# Patient Record
Sex: Male | Born: 1935 | Race: White | Hispanic: No | Marital: Married | Smoking: Never smoker
Health system: Southern US, Community
[De-identification: ages and names within clinical notes are randomized; demographics above are authoritative.]

## PROBLEM LIST (undated history)

## (undated) DIAGNOSIS — D649 Anemia, unspecified: Secondary | ICD-10-CM

## (undated) DIAGNOSIS — Z8719 Personal history of other diseases of the digestive system: Secondary | ICD-10-CM

## (undated) DIAGNOSIS — H409 Unspecified glaucoma: Secondary | ICD-10-CM

## (undated) DIAGNOSIS — I10 Essential (primary) hypertension: Secondary | ICD-10-CM

## (undated) DIAGNOSIS — N189 Chronic kidney disease, unspecified: Secondary | ICD-10-CM

## (undated) DIAGNOSIS — K5792 Diverticulitis of intestine, part unspecified, without perforation or abscess without bleeding: Secondary | ICD-10-CM

## (undated) DIAGNOSIS — G709 Myoneural disorder, unspecified: Secondary | ICD-10-CM

## (undated) DIAGNOSIS — E119 Type 2 diabetes mellitus without complications: Secondary | ICD-10-CM

## (undated) DIAGNOSIS — E785 Hyperlipidemia, unspecified: Secondary | ICD-10-CM

## (undated) HISTORY — DX: Hyperlipidemia, unspecified: E78.5

## (undated) HISTORY — PX: BACK SURGERY: SHX140

## (undated) HISTORY — DX: Chronic kidney disease, unspecified: N18.9

## (undated) HISTORY — DX: Unspecified glaucoma: H40.9

## (undated) HISTORY — PX: HERNIA REPAIR: SHX51

## (undated) HISTORY — DX: Diverticulitis of intestine, part unspecified, without perforation or abscess without bleeding: K57.92

## (undated) HISTORY — DX: Anemia, unspecified: D64.9

## (undated) HISTORY — DX: Myoneural disorder, unspecified: G70.9

---

## 2004-02-08 ENCOUNTER — Other Ambulatory Visit: Payer: Self-pay

## 2004-10-04 ENCOUNTER — Ambulatory Visit: Payer: Self-pay | Admitting: Surgery

## 2004-10-11 ENCOUNTER — Ambulatory Visit: Payer: Self-pay | Admitting: Surgery

## 2007-03-17 ENCOUNTER — Ambulatory Visit: Payer: Self-pay | Admitting: Gastroenterology

## 2008-09-27 ENCOUNTER — Ambulatory Visit: Payer: Self-pay | Admitting: Internal Medicine

## 2008-09-28 ENCOUNTER — Ambulatory Visit: Payer: Self-pay | Admitting: Internal Medicine

## 2012-06-24 ENCOUNTER — Ambulatory Visit: Payer: Self-pay | Admitting: Ophthalmology

## 2012-06-24 DIAGNOSIS — I10 Essential (primary) hypertension: Secondary | ICD-10-CM

## 2012-07-05 ENCOUNTER — Ambulatory Visit: Payer: Self-pay | Admitting: Ophthalmology

## 2013-07-18 ENCOUNTER — Ambulatory Visit: Payer: Self-pay | Admitting: Gastroenterology

## 2013-07-20 LAB — PATHOLOGY REPORT

## 2014-05-19 DIAGNOSIS — I951 Orthostatic hypotension: Secondary | ICD-10-CM | POA: Insufficient documentation

## 2014-05-19 DIAGNOSIS — E785 Hyperlipidemia, unspecified: Secondary | ICD-10-CM | POA: Insufficient documentation

## 2014-05-19 DIAGNOSIS — G629 Polyneuropathy, unspecified: Secondary | ICD-10-CM | POA: Insufficient documentation

## 2014-05-19 DIAGNOSIS — I10 Essential (primary) hypertension: Secondary | ICD-10-CM | POA: Insufficient documentation

## 2014-12-06 DIAGNOSIS — I482 Chronic atrial fibrillation, unspecified: Secondary | ICD-10-CM | POA: Insufficient documentation

## 2015-01-30 NOTE — Op Note (Signed)
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  PROCEDURE:  Phacoemulsification with posterior chamber intraocular lens placement which required pupil stretching with the Graether pupil expansion device.   LENS IMPLANT:  ZCB00 24.0-diopter.   ULTRASOUND TIME:  23% of 2 minute and 4 seconds for CDE of 29.3.  SURGEON:  Mali Bryston Colocho, MD  ANESTHESIA:  Retrobulbar block of Xylocaine and Bupivacaine and Vitrase.   COMPLICATIONS:  None.  DESCRIPTION OF PROCEDURE:  The patient was identified in the holding room, transported to the operating room, and placed in the supine position.  The right eye was identified as the operative eye and a retrobulbar block was administered under intravenous sedation.  It was then prepped and draped in the usual sterile ophthalmic fashion.  The eye was inspected and the pupil was noted to be 4 mm and opened to 8 mm with maximal pharmacologic dilation.  A 1 mm side-port incision was made at the 12 o'clock position.  The anterior chamber was filled with Viscoat.  A 2.4 mm near-clear corneal incision was made at the 9 o'clock  position.  A Graether pupil expander was then placed through the main incision and into the anterior chamber of the eye.  The edge of the iris was secured on the lip of the pupil expander and it was released, thereby expanding the pupil to approximately 7 millimeters for completion of the cataract surgery.  A cystotome and capsulorrhexis forceps were used to make a curvilinear capsulorrhexis.   Balanced salt solution was used to hydrodissect and hydrodelineate the lens nucleus.  Phacoemulsification was used in stop and chop fashion to remove the lens, nucleus and epinucleus.  The remaining cortex was aspirated using the irrigation aspiration  handpiece.  Additional Provisc was placed into the eye to distend the capsular bag for lens placement.  ZCB00 24.0-diopter lens was then injected into the capsular bag.  The remaining viscoelastic was aspirated from the capsular bag and the anterior chamber. The pupil expanding ring was removed using a Lester hook.  The anterior chamber was filled with balanced salt solution to inflate to a physiologic pressure.    The wounds were hydrated with balanced salt solution.  The eye was noted to have a physiologic pressure without wound leak.  Topical Vigamox drops, Omnipred drops and Maxitrol ointment were applied to the eye.  The eye was shielded.   The patient was taken to the recovery room in stable condition. ____________________________ Wyonia Hough, MD crb:slb D: 07/05/2012 11:10:21 ET T: 07/05/2012 11:55:37 ET JOB#: [id number]  cc: Wyonia Hough, MD, <Dictator> Leandrew Koyanagi MD ELECTRONICALLY SIGNED 07/05/2012 13:21

## 2015-06-02 ENCOUNTER — Encounter: Payer: Self-pay | Admitting: Surgery

## 2015-06-02 ENCOUNTER — Inpatient Hospital Stay
Admission: EM | Admit: 2015-06-02 | Discharge: 2015-06-27 | DRG: 329 | Disposition: A | Discharge status: Skilled Nursing Facility | Payer: Medicare Other | Attending: Surgery | Admitting: Surgery

## 2015-06-02 ENCOUNTER — Emergency Department: Payer: Medicare Other

## 2015-06-02 DIAGNOSIS — N1330 Unspecified hydronephrosis: Secondary | ICD-10-CM | POA: Diagnosis not present

## 2015-06-02 DIAGNOSIS — N179 Acute kidney failure, unspecified: Secondary | ICD-10-CM

## 2015-06-02 DIAGNOSIS — R338 Other retention of urine: Secondary | ICD-10-CM | POA: Diagnosis not present

## 2015-06-02 DIAGNOSIS — J90 Pleural effusion, not elsewhere classified: Secondary | ICD-10-CM | POA: Diagnosis not present

## 2015-06-02 DIAGNOSIS — K5732 Diverticulitis of large intestine without perforation or abscess without bleeding: Secondary | ICD-10-CM

## 2015-06-02 DIAGNOSIS — E871 Hypo-osmolality and hyponatremia: Secondary | ICD-10-CM | POA: Diagnosis not present

## 2015-06-02 DIAGNOSIS — R0989 Other specified symptoms and signs involving the circulatory and respiratory systems: Secondary | ICD-10-CM | POA: Diagnosis not present

## 2015-06-02 DIAGNOSIS — D631 Anemia in chronic kidney disease: Secondary | ICD-10-CM | POA: Diagnosis present

## 2015-06-02 DIAGNOSIS — J449 Chronic obstructive pulmonary disease, unspecified: Secondary | ICD-10-CM | POA: Diagnosis present

## 2015-06-02 DIAGNOSIS — I129 Hypertensive chronic kidney disease with stage 1 through stage 4 chronic kidney disease, or unspecified chronic kidney disease: Secondary | ICD-10-CM | POA: Diagnosis present

## 2015-06-02 DIAGNOSIS — I482 Chronic atrial fibrillation: Secondary | ICD-10-CM | POA: Diagnosis present

## 2015-06-02 DIAGNOSIS — E785 Hyperlipidemia, unspecified: Secondary | ICD-10-CM | POA: Diagnosis present

## 2015-06-02 DIAGNOSIS — Z8249 Family history of ischemic heart disease and other diseases of the circulatory system: Secondary | ICD-10-CM | POA: Diagnosis not present

## 2015-06-02 DIAGNOSIS — E876 Hypokalemia: Secondary | ICD-10-CM | POA: Diagnosis not present

## 2015-06-02 DIAGNOSIS — G629 Polyneuropathy, unspecified: Secondary | ICD-10-CM | POA: Diagnosis present

## 2015-06-02 DIAGNOSIS — N138 Other obstructive and reflux uropathy: Secondary | ICD-10-CM | POA: Diagnosis not present

## 2015-06-02 DIAGNOSIS — N401 Enlarged prostate with lower urinary tract symptoms: Secondary | ICD-10-CM | POA: Diagnosis not present

## 2015-06-02 DIAGNOSIS — N183 Chronic kidney disease, stage 3 (moderate): Secondary | ICD-10-CM | POA: Diagnosis present

## 2015-06-02 DIAGNOSIS — K567 Ileus, unspecified: Secondary | ICD-10-CM | POA: Diagnosis not present

## 2015-06-02 DIAGNOSIS — J9811 Atelectasis: Secondary | ICD-10-CM | POA: Diagnosis not present

## 2015-06-02 DIAGNOSIS — Z79899 Other long term (current) drug therapy: Secondary | ICD-10-CM

## 2015-06-02 DIAGNOSIS — D72829 Elevated white blood cell count, unspecified: Secondary | ICD-10-CM

## 2015-06-02 DIAGNOSIS — M545 Low back pain: Secondary | ICD-10-CM | POA: Diagnosis not present

## 2015-06-02 DIAGNOSIS — H9190 Unspecified hearing loss, unspecified ear: Secondary | ICD-10-CM | POA: Diagnosis present

## 2015-06-02 DIAGNOSIS — K219 Gastro-esophageal reflux disease without esophagitis: Secondary | ICD-10-CM | POA: Diagnosis present

## 2015-06-02 DIAGNOSIS — H409 Unspecified glaucoma: Secondary | ICD-10-CM | POA: Diagnosis present

## 2015-06-02 DIAGNOSIS — J9600 Acute respiratory failure, unspecified whether with hypoxia or hypercapnia: Secondary | ICD-10-CM | POA: Diagnosis not present

## 2015-06-02 DIAGNOSIS — E8770 Fluid overload, unspecified: Secondary | ICD-10-CM | POA: Diagnosis not present

## 2015-06-02 DIAGNOSIS — K913 Postprocedural intestinal obstruction: Secondary | ICD-10-CM | POA: Diagnosis not present

## 2015-06-02 DIAGNOSIS — K5792 Diverticulitis of intestine, part unspecified, without perforation or abscess without bleeding: Secondary | ICD-10-CM | POA: Diagnosis not present

## 2015-06-02 DIAGNOSIS — Z7901 Long term (current) use of anticoagulants: Secondary | ICD-10-CM | POA: Diagnosis not present

## 2015-06-02 DIAGNOSIS — Z4659 Encounter for fitting and adjustment of other gastrointestinal appliance and device: Secondary | ICD-10-CM

## 2015-06-02 DIAGNOSIS — R0602 Shortness of breath: Secondary | ICD-10-CM

## 2015-06-02 DIAGNOSIS — K5700 Diverticulitis of small intestine with perforation and abscess without bleeding: Secondary | ICD-10-CM | POA: Diagnosis not present

## 2015-06-02 DIAGNOSIS — E1122 Type 2 diabetes mellitus with diabetic chronic kidney disease: Secondary | ICD-10-CM | POA: Diagnosis present

## 2015-06-02 DIAGNOSIS — K5720 Diverticulitis of large intestine with perforation and abscess without bleeding: Principal | ICD-10-CM

## 2015-06-02 DIAGNOSIS — Z452 Encounter for adjustment and management of vascular access device: Secondary | ICD-10-CM

## 2015-06-02 DIAGNOSIS — IMO0001 Reserved for inherently not codable concepts without codable children: Secondary | ICD-10-CM

## 2015-06-02 DIAGNOSIS — K5730 Diverticulosis of large intestine without perforation or abscess without bleeding: Secondary | ICD-10-CM | POA: Diagnosis present

## 2015-06-02 HISTORY — DX: Essential (primary) hypertension: I10

## 2015-06-02 HISTORY — DX: Personal history of other diseases of the digestive system: Z87.19

## 2015-06-02 HISTORY — DX: Type 2 diabetes mellitus without complications: E11.9

## 2015-06-02 LAB — URINALYSIS COMPLETE WITH MICROSCOPIC (ARMC ONLY)
Bacteria, UA: NONE SEEN
Bilirubin Urine: NEGATIVE
Glucose, UA: NEGATIVE mg/dL
KETONES UR: NEGATIVE mg/dL
Leukocytes, UA: NEGATIVE
Nitrite: NEGATIVE
PROTEIN: NEGATIVE mg/dL
SQUAMOUS EPITHELIAL / LPF: NONE SEEN
Specific Gravity, Urine: 1.015 (ref 1.005–1.030)
pH: 6 (ref 5.0–8.0)

## 2015-06-02 LAB — CBC WITH DIFFERENTIAL/PLATELET
BASOS ABS: 0 10*3/uL (ref 0–0.1)
Basophils Relative: 0 %
Eosinophils Absolute: 0.9 10*3/uL — ABNORMAL HIGH (ref 0–0.7)
Eosinophils Relative: 6 %
HEMATOCRIT: 45.4 % (ref 40.0–52.0)
Hemoglobin: 15.3 g/dL (ref 13.0–18.0)
LYMPHS PCT: 9 %
Lymphs Abs: 1.3 10*3/uL (ref 1.0–3.6)
MCH: 31.6 pg (ref 26.0–34.0)
MCHC: 33.8 g/dL (ref 32.0–36.0)
MCV: 93.6 fL (ref 80.0–100.0)
Monocytes Absolute: 0.5 10*3/uL (ref 0.2–1.0)
Monocytes Relative: 4 %
NEUTROS ABS: 11.9 10*3/uL — AB (ref 1.4–6.5)
NEUTROS PCT: 81 %
Platelets: 260 10*3/uL (ref 150–440)
RBC: 4.85 MIL/uL (ref 4.40–5.90)
RDW: 14.7 % — ABNORMAL HIGH (ref 11.5–14.5)
WBC: 14.6 10*3/uL — AB (ref 3.8–10.6)

## 2015-06-02 LAB — COMPREHENSIVE METABOLIC PANEL
ALBUMIN: 4.6 g/dL (ref 3.5–5.0)
ALT: 14 U/L — AB (ref 17–63)
AST: 20 U/L (ref 15–41)
Alkaline Phosphatase: 61 U/L (ref 38–126)
Anion gap: 12 (ref 5–15)
BILIRUBIN TOTAL: 1.2 mg/dL (ref 0.3–1.2)
BUN: 31 mg/dL — AB (ref 6–20)
CHLORIDE: 98 mmol/L — AB (ref 101–111)
CO2: 27 mmol/L (ref 22–32)
CREATININE: 2.18 mg/dL — AB (ref 0.61–1.24)
Calcium: 10.1 mg/dL (ref 8.9–10.3)
GFR calc Af Amer: 29 mL/min — ABNORMAL LOW (ref >60)
GFR, EST NON AFRICAN AMERICAN: 25 mL/min — AB (ref >60)
GLUCOSE: 127 mg/dL — AB (ref 65–99)
Potassium: 3.3 mmol/L — ABNORMAL LOW (ref 3.5–5.1)
Sodium: 137 mmol/L (ref 135–145)
TOTAL PROTEIN: 7.4 g/dL (ref 6.5–8.1)

## 2015-06-02 LAB — LIPASE, BLOOD: LIPASE: 27 U/L (ref 22–51)

## 2015-06-02 MED ORDER — IOHEXOL 240 MG/ML SOLN
25.0000 mL | Freq: Once | INTRAMUSCULAR | Status: AC | PRN
  Administered 2015-06-02: 25 mL via ORAL

## 2015-06-02 MED ORDER — PIPERACILLIN-TAZOBACTAM 3.375 G IVPB
3.3750 g | Freq: Three times a day (TID) | INTRAVENOUS | Status: DC
  Administered 2015-06-03 – 2015-06-18 (×44): 3.375 g via INTRAVENOUS
  Filled 2015-06-02 (×49): qty 50

## 2015-06-02 MED ORDER — TIMOLOL MALEATE 0.25 % OP SOLN
2.0000 [drp] | Freq: Every day | OPHTHALMIC | Status: DC
  Administered 2015-06-03: 2 [drp] via OPHTHALMIC
  Filled 2015-06-02: qty 5

## 2015-06-02 MED ORDER — DORZOLAMIDE HCL 2 % OP SOLN
2.0000 [drp] | Freq: Every day | OPHTHALMIC | Status: DC
  Administered 2015-06-03: 2 [drp] via OPHTHALMIC
  Filled 2015-06-02: qty 10

## 2015-06-02 MED ORDER — LATANOPROST 0.005 % OP SOLN
2.0000 [drp] | Freq: Every day | OPHTHALMIC | Status: DC
  Filled 2015-06-02: qty 2.5

## 2015-06-02 MED ORDER — PANTOPRAZOLE SODIUM 40 MG IV SOLR
40.0000 mg | Freq: Every day | INTRAVENOUS | Status: DC
  Administered 2015-06-03 – 2015-06-07 (×6): 40 mg via INTRAVENOUS
  Filled 2015-06-02 (×6): qty 40

## 2015-06-02 MED ORDER — METOPROLOL SUCCINATE ER 100 MG PO TB24
200.0000 mg | ORAL_TABLET | Freq: Every day | ORAL | Status: DC
  Administered 2015-06-03 – 2015-06-05 (×3): 200 mg via ORAL
  Filled 2015-06-02 (×3): qty 2

## 2015-06-02 MED ORDER — ONDANSETRON HCL 4 MG/2ML IJ SOLN
4.0000 mg | Freq: Once | INTRAMUSCULAR | Status: AC
  Administered 2015-06-02: 4 mg via INTRAVENOUS

## 2015-06-02 MED ORDER — NALOXONE HCL 0.4 MG/ML IJ SOLN
0.4000 mg | INTRAMUSCULAR | Status: DC | PRN
  Filled 2015-06-02: qty 1

## 2015-06-02 MED ORDER — DIPHENHYDRAMINE HCL 12.5 MG/5ML PO ELIX
12.5000 mg | ORAL_SOLUTION | Freq: Four times a day (QID) | ORAL | Status: DC | PRN
  Filled 2015-06-02: qty 5

## 2015-06-02 MED ORDER — ONDANSETRON HCL 4 MG/2ML IJ SOLN: 4.0000 mg | Freq: Four times a day (QID) | INTRAMUSCULAR | Status: DC | PRN

## 2015-06-02 MED ORDER — MORPHINE SULFATE (PF) 4 MG/ML IV SOLN
4.0000 mg | Freq: Once | INTRAVENOUS | Status: AC
  Administered 2015-06-02: 4 mg via INTRAVENOUS
  Filled 2015-06-02: qty 1

## 2015-06-02 MED ORDER — ONDANSETRON HCL 4 MG/2ML IJ SOLN
4.0000 mg | Freq: Once | INTRAMUSCULAR | Status: AC
  Administered 2015-06-02: 4 mg via INTRAVENOUS
  Filled 2015-06-02: qty 2

## 2015-06-02 MED ORDER — TRAMADOL HCL 50 MG PO TABS
ORAL_TABLET | ORAL | Status: AC
  Filled 2015-06-02: qty 1

## 2015-06-02 MED ORDER — ACETAMINOPHEN 650 MG RE SUPP: 650.0000 mg | Freq: Four times a day (QID) | RECTAL | Status: DC | PRN

## 2015-06-02 MED ORDER — HYDROMORPHONE 0.3 MG/ML IV SOLN
INTRAVENOUS | Status: DC
  Administered 2015-06-03: 0.3 mg via INTRAVENOUS
  Administered 2015-06-03 (×2): 0.1 mg via INTRAVENOUS
  Administered 2015-06-03: 02:00:00 via INTRAVENOUS
  Administered 2015-06-04: 0.99 mg via INTRAVENOUS
  Administered 2015-06-04: 0 mg via INTRAVENOUS
  Administered 2015-06-04: 0.1 mg via INTRAVENOUS
  Administered 2015-06-04 (×2): 0 mg via INTRAVENOUS
  Administered 2015-06-04: 0.2 mg via INTRAVENOUS
  Administered 2015-06-05: 0.1 mg via INTRAVENOUS
  Administered 2015-06-05: 0.899 mg via INTRAVENOUS
  Administered 2015-06-05: 0.3 mg via INTRAVENOUS
  Administered 2015-06-05: 0.5 mg via INTRAVENOUS
  Administered 2015-06-05: 0.699 mg via INTRAVENOUS
  Administered 2015-06-06: 0.1 mg via INTRAVENOUS
  Administered 2015-06-06: 8.67 mg via INTRAVENOUS
  Administered 2015-06-07: 05:00:00 via INTRAVENOUS
  Administered 2015-06-07: 0.699 mg via INTRAVENOUS
  Administered 2015-06-07: 1.33 mg via INTRAVENOUS
  Administered 2015-06-07: 0.299 mg via INTRAVENOUS
  Administered 2015-06-07: 0.3 mg via INTRAVENOUS
  Administered 2015-06-08: 0 mg via INTRAVENOUS
  Administered 2015-06-08: 0.2 mg via INTRAVENOUS
  Administered 2015-06-08: 0 mg via INTRAVENOUS
  Administered 2015-06-08: 0.3 mg via INTRAVENOUS
  Administered 2015-06-08: 0.4999 mg via INTRAVENOUS
  Administered 2015-06-09: 0.1 mg via INTRAVENOUS
  Administered 2015-06-09: 4 mg via INTRAVENOUS
  Administered 2015-06-09: 0.1 mg via INTRAVENOUS
  Administered 2015-06-09: 14:00:00 via INTRAVENOUS
  Administered 2015-06-09: 0.5 mg via INTRAVENOUS
  Administered 2015-06-10: 0.4 mg via INTRAVENOUS
  Administered 2015-06-10: 0.2 mg via INTRAVENOUS
  Administered 2015-06-10: 0.999 mg via INTRAVENOUS
  Administered 2015-06-10: 1.49 mg via INTRAVENOUS
  Administered 2015-06-10: 0.39 mg via INTRAVENOUS
  Administered 2015-06-11: 19:00:00 via INTRAVENOUS
  Administered 2015-06-11: 0.2 mg via INTRAVENOUS
  Administered 2015-06-11: 1.09 mg via INTRAVENOUS
  Filled 2015-06-02 (×5): qty 25

## 2015-06-02 MED ORDER — ONDANSETRON 4 MG PO TBDP
4.0000 mg | ORAL_TABLET | Freq: Four times a day (QID) | ORAL | Status: DC | PRN
  Filled 2015-06-02: qty 1

## 2015-06-02 MED ORDER — HEPARIN SODIUM (PORCINE) 5000 UNIT/ML IJ SOLN
5000.0000 [IU] | Freq: Three times a day (TID) | INTRAMUSCULAR | Status: DC
  Administered 2015-06-03 – 2015-06-07 (×14): 5000 [IU] via SUBCUTANEOUS
  Filled 2015-06-02 (×14): qty 1

## 2015-06-02 MED ORDER — BRIMONIDINE TARTRATE 0.2 % OP SOLN
2.0000 [drp] | Freq: Every day | OPHTHALMIC | Status: DC
  Administered 2015-06-03: 2 [drp] via OPHTHALMIC
  Filled 2015-06-02: qty 5

## 2015-06-02 MED ORDER — ONDANSETRON HCL 4 MG/2ML IJ SOLN
INTRAMUSCULAR | Status: AC
  Administered 2015-06-02: 4 mg via INTRAVENOUS
  Filled 2015-06-02: qty 2

## 2015-06-02 MED ORDER — PIPERACILLIN-TAZOBACTAM 3.375 G IVPB 30 MIN
3.3750 g | Freq: Once | INTRAVENOUS | Status: AC
Start: 2015-06-02 — End: 2015-06-03
  Administered 2015-06-03: 3.375 g via INTRAVENOUS

## 2015-06-02 MED ORDER — ACETAMINOPHEN 325 MG PO TABS: 650.0000 mg | ORAL_TABLET | Freq: Four times a day (QID) | ORAL | Status: DC | PRN

## 2015-06-02 MED ORDER — SODIUM CHLORIDE 0.9 % IV SOLN
INTRAVENOUS | Status: DC
  Administered 2015-06-03 – 2015-06-04 (×5): via INTRAVENOUS

## 2015-06-02 MED ORDER — ONDANSETRON HCL 4 MG/2ML IJ SOLN
4.0000 mg | Freq: Four times a day (QID) | INTRAMUSCULAR | Status: DC | PRN
  Administered 2015-06-04 – 2015-06-24 (×9): 4 mg via INTRAVENOUS
  Filled 2015-06-02 (×9): qty 2

## 2015-06-02 MED ORDER — SODIUM CHLORIDE 0.9 % IJ SOLN: 9.0000 mL | INTRAMUSCULAR | Status: DC | PRN

## 2015-06-02 MED ORDER — DIPHENHYDRAMINE HCL 50 MG/ML IJ SOLN: 12.5000 mg | Freq: Four times a day (QID) | INTRAMUSCULAR | Status: DC | PRN

## 2015-06-02 NOTE — ED Notes (Signed)
Pt reports pain in his lower abdomen since this afternoon. States that the pain is across lower abdomen, but is more severe on left side. Pt states that this is the worst pain he has ever had. Pt alert & oriented with warm, dry skin. Some distress noted.

## 2015-06-02 NOTE — ED Provider Notes (Signed)
[facility] Emergency Department Provider Note  ____________________________________________  Time seen: On arrival  I have reviewed the triage vital signs and the nursing notes.   HISTORY  Chief Complaint Abdominal Pain    HPI Jesse Macias is a 81 y.o. male who presents with severe left-sided cramping abdominal pain. He reports is been worsening over the last 2 days. He does report a history of diverticulitis but this is much worse. He denies fevers chills. He has had some nausea. He reports the pain is more severe in the area of previous hernia repair in his left groin.     No past medical history on file.  There are no active problems to display for this patient.   No past surgical history on file.  No current outpatient prescriptions on file.  Allergies Review of patient's allergies indicates not on file.  No family history on file.  Social History  patient does not use alcohol or smoke cigarettes  Review of Systems  Constitutional: Negative for fever. Eyes: Negative for visual changes. ENT: Negative for sore throat Cardiovascular: Negative for chest pain. Respiratory: Negative for shortness of breath. Gastrointestinal: Positive for abdominal pain nausea and vomiting Genitourinary: Negative for dysuria. Musculoskeletal: Negative for back pain. Skin: Negative for rash. Neurological: Negative for headaches or focal weakness Psychiatric: No anxiety    ____________________________________________   PHYSICAL EXAM:  VITAL SIGNS: ED Triage Vitals  Enc Vitals Group     BP 06/02/15 2003 188/95 mmHg     Pulse Rate 06/02/15 2003 80     Resp 06/02/15 2003 20     Temp 06/02/15 2003 97.7 F (36.5 C)     Temp Source 06/02/15 2003 Oral     SpO2 06/02/15 2003 96 %     Weight 06/02/15 2003 140 lb (63.504 kg)     Height 06/02/15 2003 5\' 6"  (1.676 m)     Head Cir --      Peak Flow --      Pain Score 06/02/15 2004 10     Pain Loc --       Pain Edu? --      Excl. in [place]? --      Constitutional: Alert and oriented. In obvious pain Eyes: Conjunctivae are normal.  ENT   Head: Normocephalic and atraumatic.   Mouth/Throat: Mucous membranes are moist. Cardiovascular: Normal rate, regular rhythm. Normal and symmetric distal pulses are present in all extremities. No murmurs, rubs, or gallops. Respiratory: Normal respiratory effort without tachypnea nor retractions. Breath sounds are clear and equal bilaterally.  Gastrointestinal: Significant tenderness palpation primarily in the left lower quadrant. No distention. There is no CVA tenderness. Genitourinary: deferred Musculoskeletal: Nontender with normal range of motion in all extremities. No lower extremity tenderness nor edema. Neurologic:  Normal speech and language. No gross focal neurologic deficits are appreciated. Skin:  Skin is warm, dry and intact. No rash noted. Psychiatric: Mood and affect are normal. Patient exhibits appropriate insight and judgment.  ____________________________________________    LABS (pertinent positives/negatives)  Labs Reviewed  CBC WITH DIFFERENTIAL/PLATELET - Abnormal; Notable for the following:    WBC 14.6 (*)    RDW 14.7 (*)    Neutro Abs 11.9 (*)    Eosinophils Absolute 0.9 (*)    All other components within normal limits  COMPREHENSIVE METABOLIC PANEL - Abnormal; Notable for the following:    Potassium 3.3 (*)    Chloride 98 (*)    Glucose, Bld 127 (*)    BUN  31 (*)    Creatinine, Ser 2.18 (*)    ALT 14 (*)    GFR calc non Af Amer 25 (*)    GFR calc Af Amer 29 (*)    All other components within normal limits  URINALYSIS COMPLETEWITH MICROSCOPIC ([facility] ONLY) - Abnormal; Notable for the following:    Color, Urine YELLOW (*)    APPearance CLEAR (*)    Hgb urine dipstick 1+ (*)    All other components within normal limits  LIPASE, BLOOD     ____________________________________________   EKG  None  ____________________________________________    RADIOLOGY I have personally reviewed any xrays that were ordered on this patient: CT abdomen and pelvis is consistent with perforated diverticulitis  ____________________________________________   PROCEDURES  Procedure(s) performed: none  Critical Care performed:yes  CRITICAL CARE Performed by: Lavonia Drafts   Total critical care time: 35 min  Critical care time was exclusive of separately billable procedures and treating other patients.  Critical care was necessary to treat or prevent imminent or life-threatening deterioration.  Critical care was time spent personally by me on the following activities: development of treatment plan with patient and/or surrogate as well as nursing, discussions with consultants, evaluation of patient's response to treatment, examination of patient, obtaining history from patient or surrogate, ordering and performing treatments and interventions, ordering and review of laboratory studies, ordering and review of radiographic studies, pulse oximetry and re-evaluation of patient's condition.   ____________________________________________   INITIAL IMPRESSION / ASSESSMENT AND PLAN / ED COURSE  Pertinent labs & imaging results that were available during my care of the patient were reviewed by me and considered in my medical decision making (see chart for details).  Patient presents with significant abdominal tenderness to palpation. Given location concern for diverticulitis. We will obtain CT of the pelvis. Patient's creatinine is too high for IV contrast. He had significant difficulty with by mouth contrast. Morphine and Zofran given IV 2  CT consistent with perforated diverticulitis. I have consulted general surgery. Dr. Rexene Edison will see the patient in the emergency  department  ____________________________________________   FINAL CLINICAL IMPRESSION(S) / ED DIAGNOSES  Final diagnoses:  Perforated diverticulum of large intestine     Lavonia Drafts, MD 06/02/15 2317

## 2015-06-02 NOTE — Progress Notes (Addendum)
ANTIBIOTIC CONSULT NOTE - INITIAL  Pharmacy Consult for Zosyn Indication: IAI  Not on File  Patient Measurements: Height: 5\' 6"  (167.6 cm) Weight: 140 lb (63.504 kg) IBW/kg (Calculated) : 63.8 Adjusted Body Weight: 63.5 kg  Vital Signs: Temp: 97.7 F (36.5 C) (08/20 2003) Temp Source: Oral (08/20 2003) BP: 188/95 mmHg (08/20 2003) Pulse Rate: 80 (08/20 2003) Intake/Output from previous day:   Intake/Output from this shift:    Labs:  Recent Labs  06/02/15 2016  WBC 14.6*  HGB 15.3  PLT 260  CREATININE 2.18*   Estimated Creatinine Clearance: 20.6 mL/min (by C-G formula based on Cr of 2.18). No results for input(s): VANCOTROUGH, VANCOPEAK, VANCORANDOM, GENTTROUGH, GENTPEAK, GENTRANDOM, TOBRATROUGH, TOBRAPEAK, TOBRARND, AMIKACINPEAK, AMIKACINTROU, AMIKACIN in the last 72 hours.   Microbiology: No results found for this or any previous visit (from the past 720 hour(s)).  Medical History: No past medical history on file.  Medications:  Infusions:  . piperacillin-tazobactam     Assessment: 89 yom cc abdominal pain in area of previous hernia repair, left groin. CT consistent with perforated diverticulitis.  Goal of Therapy:  Control pain, resolve infection  Plan:  Expected duration 7 days with resolution of temperature and/or normalization of WBC. Zosyn 3.375 gm IV Q8H EI. Looks like baseline SCr is 1.8 which would make CrCl approximately 25 mL/min. AKI here but CrCl still >20 mL/min (20.6 mL/min) so Zosyn dose has not been reduced for renal function. Will continue to monitor and adjust if needed.  Laural Benes, Pharm.D. Clinical Pharmacist 06/02/2015,11:19 PM

## 2015-06-02 NOTE — ED Notes (Signed)
Pt c/o pain across lower abd since 2 pm today. States pain has become severe. Pain is more severe in area of previous hernia repair. +n/v. Denies diarrhea.

## 2015-06-02 NOTE — ED Notes (Signed)
Pt vomited a large amount after drinking contrast solution. CT personnel brought another bottle. Pt given another dose of zofran and is now sipping slowly on contrast solution.

## 2015-06-02 NOTE — H&P (Signed)
CC: LLQ pain, nausea/vomiting  HPI: [name] is a pleasant 81 yo M with a history of HTN, 1 prior episode of diverticulitis who presents with 8 hours of acute onset, severe and worsening LLQ pain.  Began acutely.  + nausea and emesis in the lobby, unable to drink contrast.  Has had 1 episode in the past.  Colonoscopy in 2014 by Dr. Candace Cruise.  No fevers/chills, night sweats, shortness of breath, cough, chest pain, diarrhea/constipation, dysuria/hematuria.    Past Medical History:  Diverticulosis H/o inguinal hernia repair HTN    Medication List    ASK your doctor about these medications        brimonidine 0.2 % ophthalmic solution  Commonly known as:  ALPHAGAN  Place 2 drops into both eyes daily.     dorzolamide 2 % ophthalmic solution  Commonly known as:  TRUSOPT  Place 2 drops into both eyes daily.     latanoprost 0.005 % ophthalmic solution  Commonly known as:  XALATAN  Place 2 drops into both eyes daily.     losartan-hydrochlorothiazide 100-12.5 MG per tablet  Commonly known as:  HYZAAR  Take 1 tablet by mouth daily.     timolol 0.25 % ophthalmic solution  Commonly known as:  TIMOPTIC  Place 2 drops into both eyes daily.     TOPROL XL 200 MG 24 hr tablet  Generic drug:  metoprolol  Take 1 tablet by mouth daily.       Not on File   Social History   Social History  . Marital Status: Married    Spouse Name: N/A  . Number of Children: N/A  . Years of Education: N/A   Occupational History  . Not on file.   Social History Main Topics  . Smoking status: Not on file  . Smokeless tobacco: Not on file  . Alcohol Use: Not on file  . Drug Use: Not on file  . Sexual Activity: Not on file   Other Topics Concern  . Not on file   Social History Narrative  . No narrative on file   No tobacco or alcohol use, here with family, wife, sons  FH: HTN  ROS: Full ROS obtained, pertinent positives and negatives as above  Blood pressure 188/95, pulse 80, temperature 97.7  F (36.5 C), temperature source Oral, resp. rate 20, height 5\' 6"  (1.676 m), weight 140 lb (63.504 kg), SpO2 96 %. GEN: Interactive, appears uncomfortable FACE: no obvious facial trauma, normal external nose, normal external ears EYES: no scleral icterus, no conjunctivitis HEAD: normocephalic atraumatic CV: RRR, no MRG RESP: moving air well, lungs clear ABD: soft, significantly tender to palp RLQ nondistended EXT: moving all ext well, strength 5/5 NEURO: cnII-XII grossly intact, sensation intact all 4 ext  Labs: Personally reviewed, significant for WBC 14.6 (81% neutrophils) Cr 2.18  CT: Personally reviewed, significant for  Inflammation at jxn of descending colon and sigmoid colon, small bubbles of extraluminal gas and small bubbles of pneumoperitoneum  A/P 81 yo with history of 1 prior episode of diverticulitis, now with recurrent diverticulitis.  VSS, no indication for emergent surgical intervention.  Will admit for IV abx, IV pain control, resuscitation.  NPO as he has been throwing up.

## 2015-06-03 ENCOUNTER — Encounter: Payer: Self-pay | Admitting: *Deleted

## 2015-06-03 LAB — COMPREHENSIVE METABOLIC PANEL
ALT: 12 U/L — ABNORMAL LOW (ref 17–63)
AST: 22 U/L (ref 15–41)
Albumin: 3.7 g/dL (ref 3.5–5.0)
Alkaline Phosphatase: 49 U/L (ref 38–126)
Anion gap: 11 (ref 5–15)
BILIRUBIN TOTAL: 1.6 mg/dL — AB (ref 0.3–1.2)
BUN: 29 mg/dL — AB (ref 6–20)
CHLORIDE: 100 mmol/L — AB (ref 101–111)
CO2: 26 mmol/L (ref 22–32)
Calcium: 9.6 mg/dL (ref 8.9–10.3)
Creatinine, Ser: 1.75 mg/dL — ABNORMAL HIGH (ref 0.61–1.24)
GFR, EST AFRICAN AMERICAN: 38 mL/min — AB (ref >60)
GFR, EST NON AFRICAN AMERICAN: 33 mL/min — AB (ref >60)
Glucose, Bld: 134 mg/dL — ABNORMAL HIGH (ref 65–99)
POTASSIUM: 3.1 mmol/L — AB (ref 3.5–5.1)
Sodium: 137 mmol/L (ref 135–145)
TOTAL PROTEIN: 6.2 g/dL — AB (ref 6.5–8.1)

## 2015-06-03 LAB — CBC
HEMATOCRIT: 43.6 % (ref 40.0–52.0)
Hemoglobin: 14.7 g/dL (ref 13.0–18.0)
MCH: 31.5 pg (ref 26.0–34.0)
MCHC: 33.6 g/dL (ref 32.0–36.0)
MCV: 93.6 fL (ref 80.0–100.0)
PLATELETS: 166 10*3/uL (ref 150–440)
RBC: 4.66 MIL/uL (ref 4.40–5.90)
RDW: 14.7 % — AB (ref 11.5–14.5)
WBC: 28.2 10*3/uL — AB (ref 3.8–10.6)

## 2015-06-03 MED ORDER — LATANOPROST 0.005 % OP SOLN
2.0000 [drp] | Freq: Every day | OPHTHALMIC | Status: DC
  Administered 2015-06-04 – 2015-06-10 (×7): 2 [drp] via OPHTHALMIC
  Filled 2015-06-03: qty 2.5

## 2015-06-03 MED ORDER — HYPROMELLOSE 0.3 % OP GEL
Freq: Every day | OPHTHALMIC | Status: DC
  Administered 2015-06-03: 1 via OPHTHALMIC
  Administered 2015-06-04 – 2015-06-17 (×14): via OPHTHALMIC
  Filled 2015-06-03 (×4): qty 3.5

## 2015-06-03 MED ORDER — MORPHINE SULFATE (PF) 4 MG/ML IV SOLN
4.0000 mg | Freq: Once | INTRAVENOUS | Status: AC
  Administered 2015-06-03: 4 mg via INTRAVENOUS
  Filled 2015-06-03: qty 1

## 2015-06-03 MED ORDER — DORZOLAMIDE HCL 2 % OP SOLN
2.0000 [drp] | Freq: Two times a day (BID) | OPHTHALMIC | Status: DC
  Administered 2015-06-04 – 2015-06-10 (×13): 2 [drp] via OPHTHALMIC
  Filled 2015-06-03: qty 10

## 2015-06-03 MED ORDER — NON FORMULARY: 1.0000 "application " | Freq: Every day | Status: DC

## 2015-06-03 MED ORDER — PIPERACILLIN SOD-TAZOBACTAM SO 3.375 (3-0.375) G IV SOLR
INTRAVENOUS | Status: AC
  Filled 2015-06-03: qty 3.38

## 2015-06-03 MED ORDER — TIMOLOL MALEATE 0.25 % OP SOLN
2.0000 [drp] | Freq: Two times a day (BID) | OPHTHALMIC | Status: DC
  Administered 2015-06-04 – 2015-06-10 (×13): 2 [drp] via OPHTHALMIC
  Filled 2015-06-03: qty 5

## 2015-06-03 MED ORDER — BRIMONIDINE TARTRATE 0.2 % OP SOLN
2.0000 [drp] | Freq: Two times a day (BID) | OPHTHALMIC | Status: DC
  Administered 2015-06-04 – 2015-06-10 (×13): 2 [drp] via OPHTHALMIC
  Filled 2015-06-03: qty 5

## 2015-06-03 NOTE — Progress Notes (Signed)
CC: Diverticulitis Subjective: 81 year old male admitted from the emergency Department overnight with acute diverticulitis with possible microperforation. Patient continues to report left lower quadrant abdominal pain. Difficult historian due to hearing barriers. No changes since admission  Objective: Vital signs in last 24 hours: Temp:  [97.7 F (36.5 C)-98.1 F (36.7 C)] 97.9 F (36.6 C) (08/21 0753) Pulse Rate:  [66-82] 66 (08/21 0753) Resp:  [17-25] 20 (08/21 0753) BP: (134-188)/(65-95) 134/65 mmHg (08/21 0753) SpO2:  [94 %-98 %] 94 % (08/21 0753) Weight:  [60.827 kg (134 lb 1.6 oz)-63.504 kg (140 lb)] 60.827 kg (134 lb 1.6 oz) (08/21 0113)    Intake/Output from previous day: 08/20 0701 - 08/21 0700 In: 178 [I.V.:153; IV Piggyback:25] Out: -  Intake/Output this shift:    Physical exam:  Gen.: No acute distress Chest: Her to auscultation regular rate and rhythm Abdomen: soft and nondistended, tender to palpation to light palpation in left lower quadrant.  Lab Results: CBC   Recent Labs  06/02/15 2016 06/03/15 0602  WBC 14.6* 28.2*  HGB 15.3 14.7  HCT 45.4 43.6  PLT 260 166   BMET  Recent Labs  06/02/15 2016 06/03/15 0602  NA 137 137  K 3.3* 3.1*  CL 98* 100*  CO2 27 26  GLUCOSE 127* 134*  BUN 31* 29*  CREATININE 2.18* 1.75*  CALCIUM 10.1 9.6   PT/INR No results for input(s): LABPROT, INR in the last 72 hours. ABG No results for input(s): PHART, HCO3 in the last 72 hours.  Invalid input(s): PCO2, PO2  Studies/Results: Ct Abdomen Pelvis Wo Contrast  06/02/2015   CLINICAL DATA:  Left lower quadrant pain  EXAM: CT ABDOMEN AND PELVIS WITHOUT CONTRAST  TECHNIQUE: Multidetector CT imaging of the abdomen and pelvis was performed following the standard protocol without IV contrast.  COMPARISON:  09/28/2008  FINDINGS: BODY WALL: Status post right inguinal hernia repair.  LOWER CHEST:  Gastroesophageal reflux with small hiatal hernia and prominent  circumferential lower esophageal thickening. No surrounding adenopathy.  Cluster of nodules in the right middle lobe appear postinflammatory.  ABDOMEN/PELVIS:  Liver: Incidental cyst in the caudate lobe. No significant findings.  Biliary: No evidence of biliary obstruction or stone.  Pancreas: Unremarkable.  Spleen: Unremarkable.  Adrenals: Unremarkable.  Kidneys and ureters: No hydronephrosis or stone. Smooth bilateral renal atrophy. 22 mm cyst in the upper pole right kidney.  Bladder: Bladder wall thickening with cellules, chronic outlet obstruction.  Reproductive: Symmetric moderate enlargement of the prostate.  Bowel: No obstruction. No appendicitis.Distal colonic diverticulosis with focal active inflammation at the descending sigmoid junction where there is extraluminal gas. Small bubbles of pneumoperitoneum present in the left abdomen. No indication of abscess.  Retroperitoneum: No mass or adenopathy.  Vascular: No acute abnormality.  OSSEOUS: No acute abnormalities. Calcification ventral to the left hip could be soft tissue or bursal.  Critical Value/emergent results were called by telephone at the time of interpretation on 06/02/2015 at 11:01 pm to Dr. Lavonia Drafts , who verbally acknowledged these results.  IMPRESSION: 1. Perforated diverticulitis at the sigmoid descending junction with small pneumoperitoneum. No fluid collection. 2. Marked thickening of the distal esophagus with small hiatal hernia. This could reflect esophagitis or neoplasm. 3. Chronic bladder outlet obstruction.   Electronically Signed   By: Monte Fantasia M.D.   On: 06/02/2015 23:02    Anti-infectives: Anti-infectives    Start     Dose/Rate Route Frequency Ordered Stop   06/03/15 1000  piperacillin-tazobactam (ZOSYN) IVPB 3.375 g  3.375 g 12.5 mL/hr over 240 Minutes Intravenous 3 times per day 06/02/15 2348     06/03/15 0026  piperacillin-tazobactam (ZOSYN) 3.375 (3-0.375) G injection    Comments:  BRUMGARD, APRIL: cabinet  override      06/03/15 0026 06/03/15 0146   06/02/15 2330  piperacillin-tazobactam (ZOSYN) IVPB 3.375 g     3.375 g 100 mL/hr over 30 Minutes Intravenous  Once 06/02/15 2316 06/03/15 0215      Assessment/Plan:  81 year old male with sigmoid diverticulitis. Keep nothing by mouth for bowel rest. IV antibiotics for treatment of diverticulitis. Will replace electrolytes. Okay for home eyedrops.  Gionni Vaca T. Adonis Huguenin, MD, FACS  06/03/2015

## 2015-06-04 DIAGNOSIS — K5730 Diverticulosis of large intestine without perforation or abscess without bleeding: Secondary | ICD-10-CM

## 2015-06-04 DIAGNOSIS — K5720 Diverticulitis of large intestine with perforation and abscess without bleeding: Secondary | ICD-10-CM | POA: Insufficient documentation

## 2015-06-04 LAB — BASIC METABOLIC PANEL
ANION GAP: 11 (ref 5–15)
BUN: 27 mg/dL — ABNORMAL HIGH (ref 6–20)
CHLORIDE: 105 mmol/L (ref 101–111)
CO2: 23 mmol/L (ref 22–32)
Calcium: 9 mg/dL (ref 8.9–10.3)
Creatinine, Ser: 1.64 mg/dL — ABNORMAL HIGH (ref 0.61–1.24)
GFR calc non Af Amer: 35 mL/min — ABNORMAL LOW (ref >60)
GFR, EST AFRICAN AMERICAN: 41 mL/min — AB (ref >60)
GLUCOSE: 113 mg/dL — AB (ref 65–99)
Potassium: 3.2 mmol/L — ABNORMAL LOW (ref 3.5–5.1)
Sodium: 139 mmol/L (ref 135–145)

## 2015-06-04 LAB — CBC WITH DIFFERENTIAL/PLATELET
BASOS ABS: 0 10*3/uL (ref 0–0.1)
BASOS PCT: 0 %
Eosinophils Absolute: 0.2 10*3/uL (ref 0–0.7)
Eosinophils Relative: 1 %
HEMATOCRIT: 44 % (ref 40.0–52.0)
HEMOGLOBIN: 14.9 g/dL (ref 13.0–18.0)
LYMPHS PCT: 6 %
Lymphs Abs: 1.3 10*3/uL (ref 1.0–3.6)
MCH: 32.1 pg (ref 26.0–34.0)
MCHC: 33.9 g/dL (ref 32.0–36.0)
MCV: 94.8 fL (ref 80.0–100.0)
Monocytes Absolute: 0.7 10*3/uL (ref 0.2–1.0)
Monocytes Relative: 3 %
NEUTROS ABS: 20.7 10*3/uL — AB (ref 1.4–6.5)
NEUTROS PCT: 90 %
Platelets: 203 10*3/uL (ref 150–440)
RBC: 4.64 MIL/uL (ref 4.40–5.90)
RDW: 15 % — ABNORMAL HIGH (ref 11.5–14.5)
WBC: 22.9 10*3/uL — ABNORMAL HIGH (ref 3.8–10.6)

## 2015-06-04 LAB — PHOSPHORUS: PHOSPHORUS: 2.6 mg/dL (ref 2.5–4.6)

## 2015-06-04 LAB — MAGNESIUM: Magnesium: 1.4 mg/dL — ABNORMAL LOW (ref 1.7–2.4)

## 2015-06-04 MED ORDER — SODIUM CHLORIDE 0.9 % IV SOLN
INTRAVENOUS | Status: DC
  Administered 2015-06-05 (×2): via INTRAVENOUS

## 2015-06-04 MED ORDER — LOSARTAN POTASSIUM 50 MG PO TABS
100.0000 mg | ORAL_TABLET | Freq: Every day | ORAL | Status: DC
  Administered 2015-06-05 – 2015-06-10 (×6): 100 mg via ORAL
  Filled 2015-06-04 (×7): qty 2

## 2015-06-04 MED ORDER — HYDROCHLOROTHIAZIDE 12.5 MG PO CAPS
12.5000 mg | ORAL_CAPSULE | Freq: Every day | ORAL | Status: DC
  Administered 2015-06-05 – 2015-06-15 (×11): 12.5 mg via ORAL
  Filled 2015-06-04 (×12): qty 1

## 2015-06-04 MED ORDER — POTASSIUM CHLORIDE 20 MEQ PO PACK
20.0000 meq | PACK | Freq: Two times a day (BID) | ORAL | Status: DC
Start: 2015-06-04 — End: 2015-06-05
  Administered 2015-06-04: 20 meq via ORAL
  Filled 2015-06-04: qty 1

## 2015-06-04 NOTE — Progress Notes (Signed)
Spoke with Dr Pat Patrick concerning BP. No new orders at this time

## 2015-06-04 NOTE — Progress Notes (Signed)
CC: Diverticulitis Subjective: [place] patient was mated to the hospital with a diagnosis of diverticulitis with microperforation. He's been started on IV anabiotic's. Both he and his significant other state that he is doing better feeling better having no nausea vomiting no fevers or chills pain is much improved. This confirmed with his family member.  Objective: Vital signs in last 24 hours: Temp:  [97.7 F (36.5 C)-99 F (37.2 C)] 98.1 F (36.7 C) (08/22 0742) Pulse Rate:  [65-72] 68 (08/22 0742) Resp:  [18-26] 22 (08/22 0332) BP: (97-185)/(52-83) 173/78 mmHg (08/22 0742) SpO2:  [95 %-100 %] 100 % (08/22 0742) Last BM Date: 06/02/15  Intake/Output from previous day: 08/21 0701 - 08/22 0700 In: 2357 [I.V.:2254; IV Piggyback:103] Out: -  Intake/Output this shift:    Physical exam:  Chest clear to auscultation abdomen is slightly distended and nontympanitic minimally tender to percussion and minimally tender in the left lower quadrant with minimal guarding and no rebound. Calves are nontender neuro is grossly intact. Integument shows some ecchymoses.  Lab Results: CBC   Recent Labs  06/03/15 0602 06/04/15 0612  WBC 28.2* 22.9*  HGB 14.7 14.9  HCT 43.6 44.0  PLT 166 203   BMET  Recent Labs  06/03/15 0602 06/04/15 0612  NA 137 139  K 3.1* 3.2*  CL 100* 105  CO2 26 23  GLUCOSE 134* 113*  BUN 29* 27*  CREATININE 1.75* 1.64*  CALCIUM 9.6 9.0   PT/INR No results for input(s): LABPROT, INR in the last 72 hours. ABG No results for input(s): PHART, HCO3 in the last 72 hours.  Invalid input(s): PCO2, PO2  Studies/Results: Ct Abdomen Pelvis Wo Contrast  06/02/2015   CLINICAL DATA:  Left lower quadrant pain  EXAM: CT ABDOMEN AND PELVIS WITHOUT CONTRAST  TECHNIQUE: Multidetector CT imaging of the abdomen and pelvis was performed following the standard protocol without IV contrast.  COMPARISON:  09/28/2008  FINDINGS: BODY WALL: Status post right inguinal hernia repair.   LOWER CHEST:  Gastroesophageal reflux with small hiatal hernia and prominent circumferential lower esophageal thickening. No surrounding adenopathy.  Cluster of nodules in the right middle lobe appear postinflammatory.  ABDOMEN/PELVIS:  Liver: Incidental cyst in the caudate lobe. No significant findings.  Biliary: No evidence of biliary obstruction or stone.  Pancreas: Unremarkable.  Spleen: Unremarkable.  Adrenals: Unremarkable.  Kidneys and ureters: No hydronephrosis or stone. Smooth bilateral renal atrophy. 22 mm cyst in the upper pole right kidney.  Bladder: Bladder wall thickening with cellules, chronic outlet obstruction.  Reproductive: Symmetric moderate enlargement of the prostate.  Bowel: No obstruction. No appendicitis.Distal colonic diverticulosis with focal active inflammation at the descending sigmoid junction where there is extraluminal gas. Small bubbles of pneumoperitoneum present in the left abdomen. No indication of abscess.  Retroperitoneum: No mass or adenopathy.  Vascular: No acute abnormality.  OSSEOUS: No acute abnormalities. Calcification ventral to the left hip could be soft tissue or bursal.  Critical Value/emergent results were called by telephone at the time of interpretation on 06/02/2015 at 11:01 pm to Dr. Lavonia Drafts , who verbally acknowledged these results.  IMPRESSION: 1. Perforated diverticulitis at the sigmoid descending junction with small pneumoperitoneum. No fluid collection. 2. Marked thickening of the distal esophagus with small hiatal hernia. This could reflect esophagitis or neoplasm. 3. Chronic bladder outlet obstruction.   Electronically Signed   By: Monte Fantasia M.D.   On: 06/02/2015 23:02    Anti-infectives: Anti-infectives    Start     Dose/Rate Route Frequency  Ordered Stop   06/03/15 1000  piperacillin-tazobactam (ZOSYN) IVPB 3.375 g     3.375 g 12.5 mL/hr over 240 Minutes Intravenous 3 times per day 06/02/15 2348     06/03/15 0026   piperacillin-tazobactam (ZOSYN) 3.375 (3-0.375) G injection    Comments:  BRUMGARD, APRIL: cabinet override      06/03/15 0026 06/03/15 0146   06/02/15 2330  piperacillin-tazobactam (ZOSYN) IVPB 3.375 g     3.375 g 100 mL/hr over 30 Minutes Intravenous  Once 06/02/15 2316 06/03/15 0215      Assessment/Plan:  This patient admitted with acute diverticulitis he's been treated with IV anabiotic's. He seems to be doing better both by his subjective and his family's confirmation additionally his white blood cell count and fever curve have improved. My recommendation would be to continue the IV anabiotic's at this time. I discussed with he and his family the potential for an emergency operation with colostomy as a certainty should he worsen the understood and agreed with this plan.  Florene Glen, MD, FACS  06/04/2015

## 2015-06-05 ENCOUNTER — Inpatient Hospital Stay: Payer: Medicare Other

## 2015-06-05 LAB — CBC WITH DIFFERENTIAL/PLATELET
BASOS ABS: 0 10*3/uL (ref 0–0.1)
Basophils Relative: 0 %
EOS ABS: 0.1 10*3/uL (ref 0–0.7)
EOS PCT: 1 %
HCT: 42.3 % (ref 40.0–52.0)
Hemoglobin: 14.4 g/dL (ref 13.0–18.0)
LYMPHS PCT: 4 %
Lymphs Abs: 0.8 10*3/uL — ABNORMAL LOW (ref 1.0–3.6)
MCH: 32.1 pg (ref 26.0–34.0)
MCHC: 34 g/dL (ref 32.0–36.0)
MCV: 94.4 fL (ref 80.0–100.0)
MONO ABS: 0.7 10*3/uL (ref 0.2–1.0)
Monocytes Relative: 4 %
Neutro Abs: 17.8 10*3/uL — ABNORMAL HIGH (ref 1.4–6.5)
Neutrophils Relative %: 91 %
PLATELETS: 194 10*3/uL (ref 150–440)
RBC: 4.48 MIL/uL (ref 4.40–5.90)
RDW: 15 % — AB (ref 11.5–14.5)
WBC: 19.4 10*3/uL — ABNORMAL HIGH (ref 3.8–10.6)

## 2015-06-05 LAB — BASIC METABOLIC PANEL
ANION GAP: 10 (ref 5–15)
BUN: 21 mg/dL — AB (ref 6–20)
CALCIUM: 8.9 mg/dL (ref 8.9–10.3)
CO2: 22 mmol/L (ref 22–32)
Chloride: 106 mmol/L (ref 101–111)
Creatinine, Ser: 1.28 mg/dL — ABNORMAL HIGH (ref 0.61–1.24)
GFR calc Af Amer: 55 mL/min — ABNORMAL LOW (ref >60)
GFR, EST NON AFRICAN AMERICAN: 48 mL/min — AB (ref >60)
GLUCOSE: 130 mg/dL — AB (ref 65–99)
Potassium: 3.3 mmol/L — ABNORMAL LOW (ref 3.5–5.1)
Sodium: 138 mmol/L (ref 135–145)

## 2015-06-05 LAB — MAGNESIUM: Magnesium: 1.5 mg/dL — ABNORMAL LOW (ref 1.7–2.4)

## 2015-06-05 LAB — PHOSPHORUS: Phosphorus: 2 mg/dL — ABNORMAL LOW (ref 2.5–4.6)

## 2015-06-05 MED ORDER — HYDRALAZINE HCL 20 MG/ML IJ SOLN
10.0000 mg | Freq: Four times a day (QID) | INTRAMUSCULAR | Status: DC | PRN
  Administered 2015-06-06 – 2015-06-12 (×2): 10 mg via INTRAVENOUS
  Filled 2015-06-05 (×2): qty 1

## 2015-06-05 MED ORDER — HYDRALAZINE HCL 25 MG PO TABS
25.0000 mg | ORAL_TABLET | Freq: Three times a day (TID) | ORAL | Status: DC
  Administered 2015-06-05 – 2015-06-25 (×55): 25 mg via ORAL
  Filled 2015-06-05 (×56): qty 1

## 2015-06-05 MED ORDER — METOPROLOL TARTRATE 25 MG PO TABS
25.0000 mg | ORAL_TABLET | Freq: Four times a day (QID) | ORAL | Status: DC
  Administered 2015-06-05 – 2015-06-07 (×8): 25 mg via ORAL
  Filled 2015-06-05 (×8): qty 1

## 2015-06-05 MED ORDER — MENTHOL 3 MG MT LOZG
1.0000 | LOZENGE | OROMUCOSAL | Status: DC | PRN
  Filled 2015-06-05 (×2): qty 9

## 2015-06-05 MED ORDER — POTASSIUM CHLORIDE CRYS ER 20 MEQ PO TBCR
20.0000 meq | EXTENDED_RELEASE_TABLET | Freq: Two times a day (BID) | ORAL | Status: DC
  Administered 2015-06-05 – 2015-06-10 (×11): 20 meq via ORAL
  Filled 2015-06-05 (×11): qty 1

## 2015-06-05 NOTE — Clinical Documentation Improvement (Signed)
  Internal Medicine  Please clarify the medical condition related to the below clinical findings.  Hypokalemia Other______ Unable to suspect or determine  06/02/15: K= 3.3 06/03/15: K= 3.1 06/04/15: K= 3.2 06/05/15: K= 3.3 Medication: Klor 20 mEq BID  Thank you,  Pryor Montes, BSN, RN [place] HIM/Clinical Documentation Specialist [email or web address] [phone]

## 2015-06-05 NOTE — Progress Notes (Signed)
Spoke with Dr. Burt Knack regarding patient's BP, nausea/vomiting, and respiratory complaints.  Orders received.

## 2015-06-05 NOTE — Progress Notes (Signed)
CC: F lower quadrant pain Subjective: Patient with a diagnosis of acute diverticulitis with microperforation was doing well until this morning when he started vomiting. He's had no fevers or chills abdomen feels better according to both he and his family. He has been hypertensive and I have consult with prime doc.  Objective: Vital signs in last 24 hours: Temp:  [97.4 F (36.3 C)-98.1 F (36.7 C)] 97.4 F (36.3 C) (08/23 1135) Pulse Rate:  [55-80] 80 (08/23 1135) Resp:  [17-28] 18 (08/23 1135) BP: (149-212)/(80-104) 149/97 mmHg (08/23 1135) SpO2:  [96 %-100 %] 98 % (08/23 1135) Last BM Date: 06/01/15  Intake/Output from previous day: 08/22 0701 - 08/23 0700 In: 3184 [P.O.:960; I.V.:2174; IV Piggyback:50] Out: 0  Intake/Output this shift: Total I/O In: 592 [I.V.:562; NG/GT:30] Out: 500 [Emesis/NG output:500]  Physical exam:  Abdomen is soft less tender NG tube is been placed with bilious output  Lab Results: CBC   Recent Labs  06/04/15 0612 06/05/15 0516  WBC 22.9* 19.4*  HGB 14.9 14.4  HCT 44.0 42.3  PLT 203 194   BMET  Recent Labs  06/04/15 0612 06/05/15 0516  NA 139 138  K 3.2* 3.3*  CL 105 106  CO2 23 22  GLUCOSE 113* 130*  BUN 27* 21*  CREATININE 1.64* 1.28*  CALCIUM 9.0 8.9   PT/INR No results for input(s): LABPROT, INR in the last 72 hours. ABG No results for input(s): PHART, HCO3 in the last 72 hours.  Invalid input(s): PCO2, PO2  Studies/Results: Dg Abd 1 View  06/05/2015   CLINICAL DATA:  Abdominal pain, NG tube placement.  EXAM: ABDOMEN - 1 VIEW  COMPARISON:  CT 06/02/2015  FINDINGS: Nasogastric tube is present with tip over the stomach in the left upper quadrant and side-port in the region of the gastroesophageal junction. This could be advanced another 6 cm.  Examination demonstrates air and contrast throughout the colon. There are several air-filled dilated small bowel loops in the central abdomen measuring up to 4.3 cm in diameter likely  secondary ileus due to patient's known acute sigmoid diverticulitis with perforation. There mild degenerate changes of the spine and hips. Pelvic phleboliths are present.  IMPRESSION: Centralized air-filled dilated small bowel loops likely secondary ileus to patient's known acute diverticulitis with perforation.  Nasogastric tube with tip over the gastric fundus in the left upper quadrant and side-port in the region of the gastroesophageal junction. This could be advanced another 6 cm.   Electronically Signed   By: Marin Olp M.D.   On: 06/05/2015 10:03   Dg Chest Port 1 View  06/05/2015   CLINICAL DATA:  NG tube placement  EXAM: PORTABLE CHEST - 1 VIEW  COMPARISON:  None.  FINDINGS: NG tube is seen entering the stomach with the tip in the fundus. Mild cardiomegaly. No confluent airspace opacities or effusions. No acute bony abnormality.  IMPRESSION: No active disease.   Electronically Signed   By: Rolm Baptise M.D.   On: 06/05/2015 10:04    Anti-infectives: Anti-infectives    Start     Dose/Rate Route Frequency Ordered Stop   06/03/15 1000  piperacillin-tazobactam (ZOSYN) IVPB 3.375 g     3.375 g 12.5 mL/hr over 240 Minutes Intravenous 3 times per day 06/02/15 2348     06/03/15 0026  piperacillin-tazobactam (ZOSYN) 3.375 (3-0.375) G injection    Comments:  BRUMGARD, APRIL: cabinet override      06/03/15 0026 06/03/15 0146   06/02/15 2330  piperacillin-tazobactam (ZOSYN) IVPB 3.375  g     3.375 g 100 mL/hr over 30 Minutes Intravenous  Once 06/02/15 2316 06/03/15 0215      Assessment/Plan:  Slight improvement in abdominal exam and white blood cell count however he has had some nausea vomiting and required nasogastric suction placement. His likely represents an ileus. I have reviewed his films from today whereby prime doc which showed no sign of free air. We'll continue IV anabiotic's discussed alternatives with patient and family  Florene Glen, MD, FACS  06/05/2015

## 2015-06-05 NOTE — Clinical Documentation Improvement (Signed)
Internal Medicine  Abnormal Lab/Test Results:   06/02/15: BUN= 31; Crea= 2.18; GFR= 25 06/03/15: BUN= 29; Crea= 1.75; GFR= 33 06/04/15: BUN= 27: Crea= 1.64; GFR= 35 06/05/15: BUN= 21; Crea= 1.28: GFR= 48  Possible Clinical Conditions associated with below indicators  Acute Kidney Injury  Other Condition  Cannot Clinically Determine  Please exercise your independent, professional judgment when responding. A specific answer is not anticipated or expected.  Thank You,  [place]

## 2015-06-05 NOTE — Consult Note (Signed)
Patient seen and examined. New meds ordered for better BP and HR control. CASE DISCUSSED WITH FAMILY AT BEDSIDE.  Formal note to follow.

## 2015-06-05 NOTE — Progress Notes (Signed)
Full note to follow Called for n/v and HTN abd pain better VSS except HTN Soft abd less tender PD to see for HTN NG for N/V

## 2015-06-05 NOTE — Progress Notes (Signed)
Spoke with Dr. Burt Knack regarding patient's home schedule for eye drops; also pt now has an NG tube and has potassium powder ordered.  Orders received.

## 2015-06-05 NOTE — Consult Note (Signed)
[facility]se [facility]ias at [place] NAME: Jesse Macias    MR#:  [id number]  DATE OF BIRTH:  Jun 25, 1924  DATE OF ADMISSION:  06/02/2015  PRIMARY CARE PHYSICIAN: Frazier Richards MD  REQUESTING/REFERRING PHYSICIAN: Phoebe Perch MD  CHIEF COMPLAINT:   Chief Complaint  Patient presents with  . Abdominal Pain    Pt c/o pain across lower abd since 2 pm today. States pain has become severe. Pain is more severe in area of previous hernia repair. +n/v. Denies diarrhea.  feels somewhat better since NGT placed although it's irritating him, all family at bedside HISTORY OF PRESENT ILLNESS:  Jesse Macias  is a 81 y.o. male with a known history of HTN is admitted under surgical team for Diverticulitis and ileus. We are being requested to see him for medical mgmt. His BP seem to be running high although he does report significant belly pain and discomfort intermittently which seem to correlate with his tachycardia and uncontrolled BP. He reports feeling somewhat better since NGT placed. PAST MEDICAL HISTORY:   Past Medical History  Diagnosis Date  . Hypertension   . History of hiatal hernia    PAST SURGICAL HISTOIRY:   Past Surgical History  Procedure Laterality Date  . Hernia repair      Two  . Back surgery     SOCIAL HISTORY:   Social History  Substance Use Topics  . Smoking status: Never Smoker   . Smokeless tobacco: Not on file  . Alcohol Use: Not on file  Marital Status: Married  FAMILY HISTORY:  + for HTN  DRUG ALLERGIES:  No Known Allergies REVIEW OF SYSTEMS:  CONSTITUTIONAL: No fever, fatigue or weakness.  EYES: No blurred or double vision.  EARS, NOSE, AND THROAT: No tinnitus or ear pain.  RESPIRATORY: No cough, shortness of breath, wheezing or hemoptysis.  CARDIOVASCULAR: No chest pain, orthopnea, edema.  GASTROINTESTINAL: + for nausea, No vomiting/diarrhea or + for significant abdominal pain.  GENITOURINARY: No dysuria,  hematuria.  ENDOCRINE: No polyuria, nocturia,  HEMATOLOGY: No anemia, easy bruising or bleeding SKIN: No rash or lesion. MUSCULOSKELETAL: No joint pain or arthritis.   NEUROLOGIC: No tingling, numbness, weakness.  PSYCHIATRY: No anxiety or depression.  MEDICATIONS AT HOME:   Prior to Admission medications   Medication Sig Start Date End Date Taking? Authorizing Provider  brimonidine (ALPHAGAN) 0.2 % ophthalmic solution Place 2 drops into both eyes daily. 06/01/15  Yes Historical Provider, MD  dorzolamide (TRUSOPT) 2 % ophthalmic solution Place 2 drops into both eyes daily. 04/21/15  Yes Historical Provider, MD  latanoprost (XALATAN) 0.005 % ophthalmic solution Place 2 drops into both eyes daily. 03/10/15  Yes Historical Provider, MD  losartan-hydrochlorothiazide (HYZAAR) 100-12.5 MG per tablet Take 1 tablet by mouth daily. 03/29/15  Yes Historical Provider, MD  timolol (TIMOPTIC) 0.25 % ophthalmic solution Place 2 drops into both eyes daily. 05/01/15  Yes Historical Provider, MD  TOPROL XL 200 MG 24 hr tablet Take 1 tablet by mouth daily. 04/13/15  Yes Historical Provider, MD   VITAL SIGNS:  Blood pressure 182/81, pulse 73, temperature 97.9 F (36.6 C), temperature source Oral, resp. rate 18, height 5\' 5"  (1.651 m), weight 60.827 kg (134 lb 1.6 oz), SpO2 97 %. PHYSICAL EXAMINATION:  GENERAL:  81 y.o.-year-old patient lying in the bed with no acute distress.  EYES: Pupils equal, round, reactive to light and accommodation. No scleral icterus. Extraocular muscles intact.  HEENT: Head atraumatic, normocephalic. Oropharynx and nasopharynx clear.  NGT in place NECK:  Supple, no jugular venous distention. No thyroid enlargement, no tenderness.  LUNGS: Normal breath sounds bilaterally, no wheezing, rales,rhonchi or crepitation. No use of accessory muscles of respiration.  CARDIOVASCULAR: S1, S2 normal. No murmurs, rubs, or gallops.  ABDOMEN: Distended, generalized tenderness, hypoactive bowel  sounds. EXTREMITIES: No pedal edema, cyanosis, or clubbing.  NEUROLOGIC: Cranial nerves II through XII are intact. Muscle strength 5/5 in all extremities. Sensation intact. Gait not checked.  PSYCHIATRIC: The patient is alert and oriented x 3.  SKIN: No obvious rash, lesion, or ulcer.  LABORATORY PANEL:   CBC  Recent Labs Lab 06/05/15 0516  WBC 19.4*  HGB 14.4  HCT 42.3  PLT 194   ------------------------------------------------------------------------------------------------------------------  Chemistries   Recent Labs Lab 06/03/15 0602  06/05/15 0516  NA 137  < > 138  K 3.1*  < > 3.3*  CL 100*  < > 106  CO2 26  < > 22  GLUCOSE 134*  < > 130*  BUN 29*  < > 21*  CREATININE 1.75*  < > 1.28*  CALCIUM 9.6  < > 8.9  MG  --   < > 1.5*  AST 22  --   --   ALT 12*  --   --   ALKPHOS 49  --   --   BILITOT 1.6*  --   --   < > = values in this interval not displayed.  RADIOLOGY:  Dg Abd 1 View  06/05/2015   CLINICAL DATA:  Abdominal pain, NG tube placement.  EXAM: ABDOMEN - 1 VIEW  COMPARISON:  CT 06/02/2015  FINDINGS: Nasogastric tube is present with tip over the stomach in the left upper quadrant and side-port in the region of the gastroesophageal junction. This could be advanced another 6 cm.  Examination demonstrates air and contrast throughout the colon. There are several air-filled dilated small bowel loops in the central abdomen measuring up to 4.3 cm in diameter likely secondary ileus due to patient's known acute sigmoid diverticulitis with perforation. There mild degenerate changes of the spine and hips. Pelvic phleboliths are present.  IMPRESSION: Centralized air-filled dilated small bowel loops likely secondary ileus to patient's known acute diverticulitis with perforation.  Nasogastric tube with tip over the gastric fundus in the left upper quadrant and side-port in the region of the gastroesophageal junction. This could be advanced another 6 cm.   Electronically Signed    By: Marin Olp M.D.   On: 06/05/2015 10:03   Dg Chest Port 1 View  06/05/2015   CLINICAL DATA:  NG tube placement  EXAM: PORTABLE CHEST - 1 VIEW  COMPARISON:  None.  FINDINGS: NG tube is seen entering the stomach with the tip in the fundus. Mild cardiomegaly. No confluent airspace opacities or effusions. No acute bony abnormality.  IMPRESSION: No active disease.   Electronically Signed   By: Rolm Baptise M.D.   On: 06/05/2015 10:04   IMPRESSION AND PLAN:   * Uncontrolled HTN: change metoprolol to 25 mg QID and add hydralazine, continue HCTZ and losartan. Adjust as need, also start prn iv hydralazine for SBP > 160  * ARF: improving with IVF, monitor  * chronic a. Fib: on eliquis 2.5 mg PO bid at home, hold for now considering potential surgery need. Continue metoprolol for rate control.  * Hypokalemia: replete and recheck, check mg, getting 20 mg PO bid scheduled K  * SOB: caution with IVF, he's about 6.2 liters positive, on HCTZ (very low  dose), if gets hypoxic or more symptomatic, recommend cut back IVFs and consider lasix  * Ileus: has NGT in place, mgmt per surgery.  * diverticulitis: continue IV abx, if no improvement, may require surgery.    All the records are reviewed and Management plans discussed with the patient, family and they are in agreement.  CODE STATUS: Full Code  TOTAL TIME TAKING CARE OF THIS PATIENT: 55 minutes.    [facility], Sharmayne Jablon M.D on 06/05/2015 at 10:22 PM  Between 7am to 6pm - Pager - [phone]  After 6pm go to [email or web address] - password [email or web address] [facility]  Office  [phone]  CC: Primary care Physician: No primary care provider on file.

## 2015-06-05 NOTE — Progress Notes (Signed)
Spoke with Dr. Burt Knack to confirm diet order; order received.

## 2015-06-06 ENCOUNTER — Inpatient Hospital Stay: Payer: Medicare Other

## 2015-06-06 DIAGNOSIS — K567 Ileus, unspecified: Secondary | ICD-10-CM | POA: Insufficient documentation

## 2015-06-06 LAB — BASIC METABOLIC PANEL
ANION GAP: 9 (ref 5–15)
BUN: 29 mg/dL — AB (ref 6–20)
CHLORIDE: 110 mmol/L (ref 101–111)
CO2: 22 mmol/L (ref 22–32)
Calcium: 9.1 mg/dL (ref 8.9–10.3)
Creatinine, Ser: 1.42 mg/dL — ABNORMAL HIGH (ref 0.61–1.24)
GFR, EST AFRICAN AMERICAN: 49 mL/min — AB (ref >60)
GFR, EST NON AFRICAN AMERICAN: 42 mL/min — AB (ref >60)
Glucose, Bld: 111 mg/dL — ABNORMAL HIGH (ref 65–99)
POTASSIUM: 3.1 mmol/L — AB (ref 3.5–5.1)
SODIUM: 141 mmol/L (ref 135–145)

## 2015-06-06 LAB — CBC WITH DIFFERENTIAL/PLATELET
BASOS ABS: 0 10*3/uL (ref 0–0.1)
BASOS PCT: 0 %
EOS PCT: 2 %
Eosinophils Absolute: 0.4 10*3/uL (ref 0–0.7)
HEMATOCRIT: 39.6 % — AB (ref 40.0–52.0)
HEMOGLOBIN: 13.7 g/dL (ref 13.0–18.0)
LYMPHS ABS: 0.7 10*3/uL — AB (ref 1.0–3.6)
Lymphocytes Relative: 4 %
MCH: 32.6 pg (ref 26.0–34.0)
MCHC: 34.6 g/dL (ref 32.0–36.0)
MCV: 94.2 fL (ref 80.0–100.0)
MONOS PCT: 6 %
Monocytes Absolute: 1.1 10*3/uL — ABNORMAL HIGH (ref 0.2–1.0)
Neutro Abs: 16.8 10*3/uL — ABNORMAL HIGH (ref 1.4–6.5)
Neutrophils Relative %: 88 %
Platelets: 212 10*3/uL (ref 150–440)
RBC: 4.2 MIL/uL — AB (ref 4.40–5.90)
RDW: 15 % — AB (ref 11.5–14.5)
WBC: 19.1 10*3/uL — ABNORMAL HIGH (ref 3.8–10.6)

## 2015-06-06 LAB — MAGNESIUM: Magnesium: 1.7 mg/dL (ref 1.7–2.4)

## 2015-06-06 LAB — PHOSPHORUS: PHOSPHORUS: 1.6 mg/dL — AB (ref 2.5–4.6)

## 2015-06-06 MED ORDER — LACTATED RINGERS IV SOLN
INTRAVENOUS | Status: DC
  Administered 2015-06-06 – 2015-06-07 (×3): via INTRAVENOUS

## 2015-06-06 MED ORDER — BISACODYL 10 MG RE SUPP
10.0000 mg | Freq: Once | RECTAL | Status: AC
  Administered 2015-06-06: 10 mg via RECTAL
  Filled 2015-06-06: qty 1

## 2015-06-06 MED ORDER — POTASSIUM CHLORIDE CRYS ER 20 MEQ PO TBCR
40.0000 meq | EXTENDED_RELEASE_TABLET | Freq: Once | ORAL | Status: AC
  Administered 2015-06-06: 40 meq via ORAL
  Filled 2015-06-06: qty 2

## 2015-06-06 MED ORDER — MAGNESIUM SULFATE 2 GM/50ML IV SOLN
2.0000 g | Freq: Once | INTRAVENOUS | Status: AC
  Administered 2015-06-06: 2 g via INTRAVENOUS
  Filled 2015-06-06: qty 50

## 2015-06-06 NOTE — Consult Note (Signed)
ANTIBIOTIC CONSULT NOTE - FOLLOW UP  Pharmacy Consult for Zosyn Indication: IAI  No Known Allergies  Patient Measurements: Height: 5\' 5"  (165.1 cm) Weight: 134 lb 1.6 oz (60.827 kg) IBW/kg (Calculated) : 61.5 Adjusted Body Weight:   Vital Signs: Temp: 98.2 F (36.8 C) (08/24 1158) Temp Source: Oral (08/24 1158) BP: 167/84 mmHg (08/24 1158) Pulse Rate: 85 (08/24 1158) Intake/Output from previous day: 08/23 0701 - 08/24 0700 In: 2233 [I.V.:2010; NG/GT:150; IV Piggyback:73] Out: 990 [Emesis/NG output:990] Intake/Output from this shift:    Labs:  Recent Labs  06/04/15 0612 06/05/15 0516 06/06/15 0429  WBC 22.9* 19.4* 19.1*  HGB 14.9 14.4 13.7  PLT 203 194 212  CREATININE 1.64* 1.28* 1.42*   Estimated Creatinine Clearance: 30.3 mL/min (by C-G formula based on Cr of 1.42). No results for input(s): VANCOTROUGH, VANCOPEAK, VANCORANDOM, GENTTROUGH, GENTPEAK, GENTRANDOM, TOBRATROUGH, TOBRAPEAK, TOBRARND, AMIKACINPEAK, AMIKACINTROU, AMIKACIN in the last 72 hours.   Microbiology: No results found for this or any previous visit (from the past 720 hour(s)).  Anti-infectives    Start     Dose/Rate Route Frequency Ordered Stop   06/03/15 1000  piperacillin-tazobactam (ZOSYN) IVPB 3.375 g     3.375 g 12.5 mL/hr over 240 Minutes Intravenous 3 times per day 06/02/15 2348     06/03/15 0026  piperacillin-tazobactam (ZOSYN) 3.375 (3-0.375) G injection    Comments:  BRUMGARD, APRIL: cabinet override      06/03/15 0026 06/03/15 0146   06/02/15 2330  piperacillin-tazobactam (ZOSYN) IVPB 3.375 g     3.375 g 100 mL/hr over 30 Minutes Intravenous  Once 06/02/15 2316 06/03/15 0215      Assessment: Patient doing better but still in need of antibiotics  Goal of Therapy:  resolution of infection  Plan:  continue zosyn 3.375 q 8 hr EI  Jeslynn Hollander D Oakley Orban 06/06/2015,2:24 PM

## 2015-06-06 NOTE — Progress Notes (Signed)
[place] [facility] [place] NAME: Jesse Macias    MR#:  [id number]  DATE OF BIRTH:  June 29, 1924  SUBJECTIVE:  CHIEF COMPLAINT:   Chief Complaint  Patient presents with  . Abdominal Pain    Pt c/o pain across lower abd since 2 pm today. States pain has become severe. Pain is more severe in area of previous hernia repair. +n/v. Denies diarrhea.  Feels better today, less abd distension and tolerating CLD REVIEW OF SYSTEMS:  Review of Systems  Constitutional: Negative for fever, weight loss, malaise/fatigue and diaphoresis.  HENT: Negative for ear discharge, ear pain, hearing loss, nosebleeds, sore throat and tinnitus.   Eyes: Negative for blurred vision and pain.  Respiratory: Negative for cough, hemoptysis, shortness of breath and wheezing.   Cardiovascular: Negative for chest pain, palpitations, orthopnea and leg swelling.  Gastrointestinal: Positive for abdominal pain. Negative for heartburn, nausea, vomiting, diarrhea, constipation and blood in stool.  Genitourinary: Negative for dysuria, urgency and frequency.  Musculoskeletal: Negative for myalgias and back pain.  Skin: Negative for itching and rash.  Neurological: Negative for dizziness, tingling, tremors, focal weakness, seizures, weakness and headaches.  Psychiatric/Behavioral: Negative for depression. The patient is not nervous/anxious.    DRUG ALLERGIES:  No Known Allergies VITALS:  Blood pressure 155/90, pulse 93, temperature 97.8 F (36.6 C), temperature source Oral, resp. rate 17, height 5\' 5"  (1.651 m), weight 60.827 kg (134 lb 1.6 oz), SpO2 98 %. PHYSICAL EXAMINATION:  Physical Exam  Constitutional: He is oriented to person, place, and time and well-developed, well-nourished, and in no distress.  HENT:  Head: Normocephalic and atraumatic.  Eyes: Conjunctivae and EOM are normal. Pupils are equal, round, and reactive to light.  Neck: Normal range of motion. Neck supple. No  tracheal deviation present. No thyromegaly present.  Cardiovascular: Normal rate, regular rhythm and normal heart sounds.   Pulmonary/Chest: Effort normal and breath sounds normal. No respiratory distress. He has no wheezes. He exhibits no tenderness.  Abdominal: Soft. Bowel sounds are normal. He exhibits distension. There is no tenderness.  Musculoskeletal: Normal range of motion.  Neurological: He is alert and oriented to person, place, and time. No cranial nerve deficit.  Skin: Skin is warm and dry. No rash noted.  Psychiatric: Mood and affect normal.   LABORATORY PANEL:   CBC  Recent Labs Lab 06/06/15 0429  WBC 19.1*  HGB 13.7  HCT 39.6*  PLT 212   ------------------------------------------------------------------------------------------------------------------ Chemistries   Recent Labs Lab 06/03/15 0602  06/06/15 0429  NA 137  < > 141  K 3.1*  < > 3.1*  CL 100*  < > 110  CO2 26  < > 22  GLUCOSE 134*  < > 111*  BUN 29*  < > 29*  CREATININE 1.75*  < > 1.42*  CALCIUM 9.6  < > 9.1  MG  --   < > 1.7  AST 22  --   --   ALT 12*  --   --   ALKPHOS 49  --   --   BILITOT 1.6*  --   --   < > = values in this interval not displayed. RADIOLOGY:  Dg Abd 1 View  06/06/2015   CLINICAL DATA:  Ileus  EXAM: ABDOMEN - 1 VIEW  COMPARISON:  06/05/2015  FINDINGS: Persistent small bowel dilatation is noted although some mild decrease in the small bowel diameter is noted. Contrast material is again seen throughout colon. No free air is  seen. Mild degenerative changes of the lumbar spine are noted.  IMPRESSION: Slight improvement in the degree of small bowel dilatation   Electronically Signed   By: Inez Catalina M.D.   On: 06/06/2015 12:06   ASSESSMENT AND PLAN:  * Uncontrolled HTN: continue metoprolol to 25 mg QID, hydralazine, HCTZ and losartan. Adjust as need, also on prn iv hydralazine for SBP > 160  * ARF: improving with IVF, monitor  * chronic a. Fib: on eliquis 2.5 mg PO bid at  home, can resume tomorrow if ok with surgery. Continue metoprolol for rate control.  * Hypokalemia: replete and recheck, check mg, getting 20 mg PO bid scheduled K  * SOB: caution with IVF, he's about 6.8 liters positive, on HCTZ (very low dose), if gets hypoxic or more symptomatic, recommend cut back IVFs and consider lasix  * Ileus: NGT out this am, improving, tolerating CLD, mgmt per surgery.  * diverticulitis: continue IV abx, if no improvement, may require surgery.   All the records are reviewed and case discussed with Care Management/Social Worker. Management plans discussed with the patient, family and they are in agreement.  CODE STATUS: Full Code  TOTAL TIME TAKING CARE OF THIS PATIENT: 20 minutes.   More than 50% of the time was spent in counseling/coordination of care: Augustina Mood, Margarit Minshall M.D on 06/06/2015 at 5:32 PM  Between 7am to 6pm - Pager - [phone]  After 6pm go to [email or web address] - password [email or web address] [facility]  [place] [facility]  Office  [phone]  CC:  Primary care physician; No primary care provider on file.

## 2015-06-06 NOTE — Care Management Important Message (Signed)
Important Message  Patient Details  Name: Jesse Macias MRN: [id number] Date of Birth: 1923/10/20   Medicare Important Message Given:  Yes-third notification given    [name] 06/06/2015, 11:19 AM

## 2015-06-06 NOTE — Progress Notes (Signed)
CC: Acute diverticulitis with microperforation Subjective: When I walked into the room the wife said he wasn't doing as well. When I asked the patient how he was doing he's first complaint was elevated blood pressure and his second complaint was sore throat from his nasogastric tube. I then about asked about his abdomen and he stated it was no problem. He denies nausea or vomiting and is passing gas but not having bowel movements. He denies fevers or chills.  Objective: Vital signs in last 24 hours: Temp:  [96.8 F (36 C)-98.9 F (37.2 C)] 98.9 F (37.2 C) (08/24 0737) Pulse Rate:  [71-90] 90 (08/24 0737) Resp:  [16-24] 18 (08/24 0948) BP: (132-205)/(72-116) 182/90 mmHg (08/24 0900) SpO2:  [96 %-100 %] 96 % (08/24 0948) Last BM Date: 06/01/15  Intake/Output from previous day: 08/23 0701 - 08/24 0700 In: 2233 [I.V.:2010; NG/GT:150; IV Piggyback:73] Out: 990 [Emesis/NG output:990] Intake/Output this shift:    Physical exam:  Patient appears uncomfortable nasogastric tube is in place. Chest shows some mild wheezes. Abdomen is soft and less tender (every day it seems to be less tender.(. There are no peritoneal signs at this point.  Calves are nontender  Lab Results: CBC   Recent Labs  06/05/15 0516 06/06/15 0429  WBC 19.4* 19.1*  HGB 14.4 13.7  HCT 42.3 39.6*  PLT 194 212   BMET  Recent Labs  06/05/15 0516 06/06/15 0429  NA 138 141  K 3.3* 3.1*  CL 106 110  CO2 22 22  GLUCOSE 130* 111*  BUN 21* 29*  CREATININE 1.28* 1.42*  CALCIUM 8.9 9.1   PT/INR No results for input(s): LABPROT, INR in the last 72 hours. ABG No results for input(s): PHART, HCO3 in the last 72 hours.  Invalid input(s): PCO2, PO2  Studies/Results: Dg Abd 1 View  06/05/2015   CLINICAL DATA:  Abdominal pain, NG tube placement.  EXAM: ABDOMEN - 1 VIEW  COMPARISON:  CT 06/02/2015  FINDINGS: Nasogastric tube is present with tip over the stomach in the left upper quadrant and side-port in the  region of the gastroesophageal junction. This could be advanced another 6 cm.  Examination demonstrates air and contrast throughout the colon. There are several air-filled dilated small bowel loops in the central abdomen measuring up to 4.3 cm in diameter likely secondary ileus due to patient's known acute sigmoid diverticulitis with perforation. There mild degenerate changes of the spine and hips. Pelvic phleboliths are present.  IMPRESSION: Centralized air-filled dilated small bowel loops likely secondary ileus to patient's known acute diverticulitis with perforation.  Nasogastric tube with tip over the gastric fundus in the left upper quadrant and side-port in the region of the gastroesophageal junction. This could be advanced another 6 cm.   Electronically Signed   By: Marin Olp M.D.   On: 06/05/2015 10:03   Dg Chest Port 1 View  06/05/2015   CLINICAL DATA:  NG tube placement  EXAM: PORTABLE CHEST - 1 VIEW  COMPARISON:  None.  FINDINGS: NG tube is seen entering the stomach with the tip in the fundus. Mild cardiomegaly. No confluent airspace opacities or effusions. No acute bony abnormality.  IMPRESSION: No active disease.   Electronically Signed   By: Rolm Baptise M.D.   On: 06/05/2015 10:04    Anti-infectives: Anti-infectives    Start     Dose/Rate Route Frequency Ordered Stop   06/03/15 1000  piperacillin-tazobactam (ZOSYN) IVPB 3.375 g     3.375 g 12.5 mL/hr over 240 Minutes  Intravenous 3 times per day 06/02/15 2348     06/03/15 0026  piperacillin-tazobactam (ZOSYN) 3.375 (3-0.375) G injection    Comments:  BRUMGARD, APRIL: cabinet override      06/03/15 0026 06/03/15 0146   06/02/15 2330  piperacillin-tazobactam (ZOSYN) IVPB 3.375 g     3.375 g 100 mL/hr over 30 Minutes Intravenous  Once 06/02/15 2316 06/03/15 0215      Assessment/Plan:  Patient chief complaint is not related to his abdomen at this point and his exam seems to be better however his white blood cell count is still  elevated and he requires continuous IV anabiotic's. I will try removing his nasogastric tube is at seems to be a source of his discomfort and he has no further any nausea or vomiting and is passing gas I will also give him a Dulcolax suppository and reattempt clear liquids at this point. As his creatinine is slightly elevated again today I will give him additional fluids.  Florene Glen, MD, FACS  06/06/2015

## 2015-06-07 LAB — BASIC METABOLIC PANEL
Anion gap: 8 (ref 5–15)
BUN: 25 mg/dL — AB (ref 6–20)
CO2: 22 mmol/L (ref 22–32)
CREATININE: 1.45 mg/dL — AB (ref 0.61–1.24)
Calcium: 9.4 mg/dL (ref 8.9–10.3)
Chloride: 108 mmol/L (ref 101–111)
GFR, EST AFRICAN AMERICAN: 48 mL/min — AB (ref >60)
GFR, EST NON AFRICAN AMERICAN: 41 mL/min — AB (ref >60)
Glucose, Bld: 131 mg/dL — ABNORMAL HIGH (ref 65–99)
POTASSIUM: 3.5 mmol/L (ref 3.5–5.1)
SODIUM: 138 mmol/L (ref 135–145)

## 2015-06-07 LAB — CBC WITH DIFFERENTIAL/PLATELET
BASOS ABS: 0.1 10*3/uL (ref 0–0.1)
BASOS PCT: 1 %
Eosinophils Absolute: 0.4 10*3/uL (ref 0–0.7)
Eosinophils Relative: 3 %
HEMATOCRIT: 38.6 % — AB (ref 40.0–52.0)
Hemoglobin: 13.1 g/dL (ref 13.0–18.0)
LYMPHS PCT: 6 %
Lymphs Abs: 0.8 10*3/uL — ABNORMAL LOW (ref 1.0–3.6)
MCH: 31.8 pg (ref 26.0–34.0)
MCHC: 33.8 g/dL (ref 32.0–36.0)
MCV: 93.9 fL (ref 80.0–100.0)
MONO ABS: 1.3 10*3/uL — AB (ref 0.2–1.0)
Monocytes Relative: 9 %
NEUTROS ABS: 11.8 10*3/uL — AB (ref 1.4–6.5)
Neutrophils Relative %: 81 %
PLATELETS: 201 10*3/uL (ref 150–440)
RBC: 4.11 MIL/uL — AB (ref 4.40–5.90)
RDW: 14.7 % — AB (ref 11.5–14.5)
WBC: 14.4 10*3/uL — AB (ref 3.8–10.6)

## 2015-06-07 LAB — URINALYSIS COMPLETE WITH MICROSCOPIC (ARMC ONLY)
BILIRUBIN URINE: NEGATIVE
Glucose, UA: 50 mg/dL — AB
NITRITE: NEGATIVE
Protein, ur: 30 mg/dL — AB
Specific Gravity, Urine: 1.013 (ref 1.005–1.030)
pH: 7 (ref 5.0–8.0)

## 2015-06-07 LAB — MAGNESIUM: Magnesium: 1.8 mg/dL (ref 1.7–2.4)

## 2015-06-07 LAB — PHOSPHORUS: PHOSPHORUS: 1.1 mg/dL — AB (ref 2.5–4.6)

## 2015-06-07 MED ORDER — IOHEXOL 240 MG/ML SOLN: 25.0000 mL | Freq: Once | INTRAMUSCULAR | Status: DC | PRN

## 2015-06-07 MED ORDER — POTASSIUM & SODIUM PHOSPHATES 280-160-250 MG PO PACK
2.0000 | PACK | Freq: Three times a day (TID) | ORAL | Status: DC
  Administered 2015-06-07 – 2015-06-08 (×2): 2 via ORAL
  Filled 2015-06-07 (×5): qty 2

## 2015-06-07 MED ORDER — IOHEXOL 240 MG/ML SOLN
25.0000 mL | Freq: Once | INTRAMUSCULAR | Status: AC | PRN
  Administered 2015-06-08: 25 mL via ORAL

## 2015-06-07 MED ORDER — METOPROLOL SUCCINATE ER 100 MG PO TB24
200.0000 mg | ORAL_TABLET | Freq: Every day | ORAL | Status: DC
  Administered 2015-06-08 – 2015-06-10 (×3): 200 mg via ORAL
  Filled 2015-06-07 (×3): qty 2

## 2015-06-07 MED ORDER — METOPROLOL TARTRATE 50 MG PO TABS
50.0000 mg | ORAL_TABLET | Freq: Four times a day (QID) | ORAL | Status: AC
  Administered 2015-06-07 (×2): 50 mg via ORAL
  Filled 2015-06-07 (×2): qty 1

## 2015-06-07 MED ORDER — APIXABAN 5 MG PO TABS
2.5000 mg | ORAL_TABLET | Freq: Two times a day (BID) | ORAL | Status: DC
  Administered 2015-06-07 – 2015-06-08 (×2): 2.5 mg via ORAL
  Filled 2015-06-07 (×2): qty 1

## 2015-06-07 MED ORDER — LACTATED RINGERS IV SOLN
INTRAVENOUS | Status: DC
  Administered 2015-06-12: 17:00:00 via INTRAVENOUS

## 2015-06-07 MED ORDER — BOOST / RESOURCE BREEZE PO LIQD: 1.0000 | Freq: Three times a day (TID) | ORAL | Status: DC

## 2015-06-07 NOTE — Progress Notes (Signed)
Spoke with Dr. Manuella Ghazi regarding orders to d/c IV fluids.  Received order to keep at [facility] since pt has PCA infusing.

## 2015-06-07 NOTE — Progress Notes (Signed)
Initial Nutrition Assessment   INTERVENTION:   Meals and Snacks: Cater to patient preferences, diet advancement as clinically feasible per MD Medical Food Supplement Therapy: recommend addition of Boost Breeze po TID with meals while on CL diet, each supplement provides 250 kcal and 9 grams of protein Education: provided written/verbal education on low fiber diet  NUTRITION DIAGNOSIS:   Inadequate oral intake related to acute illness, altered GI function as evidenced by  (NPO/CL since admission).  GOAL:   Patient will meet greater than or equal to 90% of their needs   MONITOR:    (Energy Intake, Digestive System, Electrolyte/Renal Profile, Anthropometrics)  REASON FOR ASSESSMENT:   NPO/Clear Liquid Diet    ASSESSMENT:    Pt admitted with acute diverticulitis with microperforation; pt in pain on visit today, reports no abd pain but pain is urinary   Past Medical History  Diagnosis Date  . Hypertension   . History of hiatal hernia    Past Surgical History  Procedure Laterality Date  . Hernia repair      Two  . Back surgery       Diet Order:  Diet clear liquid Room service appropriate?: Yes; Fluid consistency:: Thin   Energy Intake: NPO/CL for 5 days; tolerated CL tray this far today with no N/V or abdominal pain; noted pt with episodes of N/V on 8/23 requiring NG placement for decompression after taking CL trays  Food and Nutrition related history: reports appetite fairly good up until the last week prior to admission. Eventhough appetite down, pt still able to eat his honeybun with coffee for breakfast and at least something for lunch and dinner meals.   Skin:  Reviewed, no issues  Last BM:  8/24   Nutrition Focused Physical Exam:  Unable to complete Nutrition-Focused physical exam at this time. Did not peform exam as pt in a lot of pain on visit today  Electrolyte and Renal Profile:  Recent Labs Lab 06/05/15 0516 06/06/15 0429 06/07/15 0530  BUN 21* 29*  25*  CREATININE 1.28* 1.42* 1.45*  NA 138 141 138  K 3.3* 3.1* 3.5  MG 1.5* 1.7 1.8  PHOS 2.0* 1.6* 1.1*   Nutritional Anemia Profile:  CBC Latest Ref Rng 06/07/2015 06/06/2015 06/05/2015  WBC 3.8 - 10.6 K/uL 14.4(H) 19.1(H) 19.4(H)  Hemoglobin 13.0 - 18.0 g/dL 13.1 13.7 14.4  Hematocrit 40.0 - 52.0 % 38.6(L) 39.6(L) 42.3  Platelets 150 - 440 K/uL 201 212 194   Meds: reviewed  Height:   Ht Readings from Last 1 Encounters:  06/03/15 5\' 5"  (1.651 m)    Weight: Reports no weight loss prior to admission  Wt Readings from Last 1 Encounters:  06/03/15 134 lb 1.6 oz (60.827 kg)    BMI:  Body mass index is 22.32 kg/(m^2).  Estimated Nutritional Needs:   Kcal:  1709-2019 kcals (1195, 1.3 AF, 1.1-1.3 IF)   Protein:  67-85 g (1.1-1.4 g/kg)   Fluid:  1525-1830 mL (25-30 ml/kg)   HIGH Care Level  Kerman Passey MS, RD, LDN [phone] Pager

## 2015-06-07 NOTE — Progress Notes (Signed)
ANTICOAGULATION CONSULT NOTE - Initial Consult  Pharmacy Consult for Apixaban (Eliquis) Indication: atrial fibrillation  No Known Allergies  Patient Measurements: Height: 5\' 5"  (165.1 cm) Weight: 134 lb 1.6 oz (60.827 kg) IBW/kg (Calculated) : 61.5   Vital Signs: Temp: 98.1 F (36.7 C) (08/25 0820) Temp Source: Oral (08/25 0820) BP: 164/92 mmHg (08/25 0820) Pulse Rate: 87 (08/25 0820)  Labs:  Recent Labs  06/05/15 0516 06/06/15 0429 06/07/15 0530  HGB 14.4 13.7 13.1  HCT 42.3 39.6* 38.6*  PLT 194 212 201  CREATININE 1.28* 1.42* 1.45*    Estimated Creatinine Clearance: 29.7 mL/min (by C-G formula based on Cr of 1.45).   Medical History: Past Medical History  Diagnosis Date  . Hypertension   . History of hiatal hernia     Medications:  Scheduled:  . apixaban  2.5 mg Oral BID  . brimonidine  2 drop Both Eyes BID  . dorzolamide  2 drop Both Eyes BID  . hydrALAZINE  25 mg Oral 3 times per day  . hydrochlorothiazide  12.5 mg Oral Daily  . HYDROmorphone PCA 0.3 mg/mL   Intravenous 6 times per day  . hypromellose   Both Eyes QHS  . latanoprost  2 drop Both Eyes QHS  . losartan  100 mg Oral Daily  . metoprolol tartrate  25 mg Oral QID  . pantoprazole (PROTONIX) IV  40 mg Intravenous QHS  . piperacillin-tazobactam (ZOSYN)  IV  3.375 g Intravenous 3 times per day  . potassium chloride  20 mEq Oral BID  . timolol  2 drop Both Eyes BID    Assessment: Patient is an 81 yo male admitted with acute diverticulitis.  Patient previously on Apixaban 2.5 mg po BID chronically for anticoagulation due to atrial fibrillation. Medication withheld upon admission due to possible surgery.  MD consulted pharmacy for apixaban dosing today.   SCr: 1.45, Wt: 60.8 kg, Age >80 years  Goal of Therapy:  Monitor CBC and SCr    Plan:  Will restart patient's home dose of Apixaban 2.5 mg po BID.  Based on patient weight (~60 kg) and age (>31), will resume apixaban 2.5 mg po BID.  Will  continue to monitor CBC and SCr during admission.  Pharmacy will continue to follow.  Lafern Brinkley G 06/07/2015,2:13 PM

## 2015-06-07 NOTE — Progress Notes (Signed)
CC: Acute diverticulitis Subjective: This is a confusing patient. He repeatedly states that he has no abdominal pain but his family members state that he does have abdominal pain even when I ask him in the presence of his family he states he has no abdominal pain but they state he is having abdominal pain. He's had no nausea or vomiting since removing the nasogastric tube and is passing gas but has not had a bowel movement since yesterday. He is having pain when he urinates. He denies fevers or chills.  Objective: Vital signs in last 24 hours: Temp:  [97.6 F (36.4 C)-98.1 F (36.7 C)] 98.1 F (36.7 C) (08/25 0820) Pulse Rate:  [81-93] 89 (08/25 1430) Resp:  [16-20] 18 (08/25 1116) BP: (137-174)/(74-120) 174/120 mmHg (08/25 1430) SpO2:  [94 %-98 %] 94 % (08/25 1116) Last BM Date: 06/06/15  Intake/Output from previous day: 08/24 0701 - 08/25 0700 In: 590 [P.O.:240; I.V.:300; IV Piggyback:50] Out: 10 [Emesis/NG output:10] Intake/Output this shift: Total I/O In: 768 [P.O.:300; I.V.:468] Out: -   Physical exam:  She is not lethargic at this time. He answers questions appropriately. His nasogastric tube is been removed. He is sitting up in a chair. Abdomen is soft and much less tender than on admission although he remains tender in the left lower quadrant and suprapubic area. Calves are nontender.  Lab Results: CBC   Recent Labs  06/06/15 0429 06/07/15 0530  WBC 19.1* 14.4*  HGB 13.7 13.1  HCT 39.6* 38.6*  PLT 212 201   BMET  Recent Labs  06/06/15 0429 06/07/15 0530  NA 141 138  K 3.1* 3.5  CL 110 108  CO2 22 22  GLUCOSE 111* 131*  BUN 29* 25*  CREATININE 1.42* 1.45*  CALCIUM 9.1 9.4   PT/INR No results for input(s): LABPROT, INR in the last 72 hours. ABG No results for input(s): PHART, HCO3 in the last 72 hours.  Invalid input(s): PCO2, PO2  Studies/Results: Dg Abd 1 View  06/06/2015   CLINICAL DATA:  Ileus  EXAM: ABDOMEN - 1 VIEW  COMPARISON:  06/05/2015   FINDINGS: Persistent small bowel dilatation is noted although some mild decrease in the small bowel diameter is noted. Contrast material is again seen throughout colon. No free air is seen. Mild degenerative changes of the lumbar spine are noted.  IMPRESSION: Slight improvement in the degree of small bowel dilatation   Electronically Signed   By: Inez Catalina M.D.   On: 06/06/2015 12:06    Anti-infectives: Anti-infectives    Start     Dose/Rate Route Frequency Ordered Stop   06/03/15 1000  piperacillin-tazobactam (ZOSYN) IVPB 3.375 g     3.375 g 12.5 mL/hr over 240 Minutes Intravenous 3 times per day 06/02/15 2348     06/03/15 0026  piperacillin-tazobactam (ZOSYN) 3.375 (3-0.375) G injection    Comments:  BRUMGARD, APRIL: cabinet override      06/03/15 0026 06/03/15 0146   06/02/15 2330  piperacillin-tazobactam (ZOSYN) IVPB 3.375 g     3.375 g 100 mL/hr over 30 Minutes Intravenous  Once 06/02/15 2316 06/03/15 0215      Assessment/Plan:  Acute diverticulitis with microperforation. This is initially seen on CT scan and a follow-up KUB failed to identify any free air. With his improvement by both exam White blood cell count and fever curve I feel as if he is improving however his family does not feel he is improving in spite of fact that he says he has no abdominal pain  and his exam is improved. With that in mind I would consider repeating a CT scan tomorrow as it has been 6 hospital days since admission. We'll see if there is a drainable abscess or whether he needs surgical intervention for Hartman's procedure which he does not want has repeatedly said that he does not want a colostomy. This was discussed with family members and with the patient.  Florene Glen, MD, FACS  06/07/2015

## 2015-06-07 NOTE — Progress Notes (Addendum)
[facility] [place] NAME: Jesse Macias    MR#:  [id number]  DATE OF BIRTH:  May 06, 1924  SUBJECTIVE:  CHIEF COMPLAINT:   Chief Complaint  Patient presents with  . Abdominal Pain    Pt c/o pain across lower abd since 2 pm today. States pain has become severe. Pain is more severe in area of previous hernia repair. +n/v. Denies diarrhea.  Feels better today, less abd distension and tolerating CLD. Family at bedside, complaints of urinary burning today and some pain with urination REVIEW OF SYSTEMS:  Review of Systems  Constitutional: Negative for fever, weight loss, malaise/fatigue and diaphoresis.  HENT: Negative for ear discharge, ear pain, hearing loss, nosebleeds, sore throat and tinnitus.   Eyes: Negative for blurred vision and pain.  Respiratory: Negative for cough, hemoptysis, shortness of breath and wheezing.   Cardiovascular: Negative for chest pain, palpitations, orthopnea and leg swelling.  Gastrointestinal: Positive for abdominal pain. Negative for heartburn, nausea, vomiting, diarrhea, constipation and blood in stool.  Genitourinary: Negative for dysuria, urgency and frequency.  Musculoskeletal: Negative for myalgias and back pain.  Skin: Negative for itching and rash.  Neurological: Negative for dizziness, tingling, tremors, focal weakness, seizures, weakness and headaches.  Psychiatric/Behavioral: Negative for depression. The patient is not nervous/anxious.    DRUG ALLERGIES:  No Known Allergies VITALS:  Blood pressure 164/92, pulse 87, temperature 98.1 F (36.7 C), temperature source Oral, resp. rate 18, height 5\' 5"  (1.651 m), weight 60.827 kg (134 lb 1.6 oz), SpO2 94 %. PHYSICAL EXAMINATION:  Physical Exam  Constitutional: He is oriented to person, place, and time and well-developed, well-nourished, and in no distress.  HENT:  Head: Normocephalic and atraumatic.  Eyes: Conjunctivae and EOM are normal. Pupils are  equal, round, and reactive to light.  Neck: Normal range of motion. Neck supple. No tracheal deviation present. No thyromegaly present.  Cardiovascular: Normal rate, regular rhythm and normal heart sounds.   Pulmonary/Chest: Effort normal and breath sounds normal. No respiratory distress. He has no wheezes. He exhibits no tenderness.  Abdominal: Soft. Bowel sounds are normal. He exhibits no distension. There is no tenderness.  Musculoskeletal: Normal range of motion.  Neurological: He is alert and oriented to person, place, and time. No cranial nerve deficit.  Skin: Skin is warm and dry. No rash noted.  Psychiatric: Mood and affect normal.   LABORATORY PANEL:   CBC  Recent Labs Lab 06/07/15 0530  WBC 14.4*  HGB 13.1  HCT 38.6*  PLT 201   ------------------------------------------------------------------------------------------------------------------ Chemistries   Recent Labs Lab 06/03/15 0602  06/07/15 0530  NA 137  < > 138  K 3.1*  < > 3.5  CL 100*  < > 108  CO2 26  < > 22  GLUCOSE 134*  < > 131*  BUN 29*  < > 25*  CREATININE 1.75*  < > 1.45*  CALCIUM 9.6  < > 9.4  MG  --   < > 1.8  AST 22  --   --   ALT 12*  --   --   ALKPHOS 49  --   --   BILITOT 1.6*  --   --   < > = values in this interval not displayed. RADIOLOGY:  Dg Abd 1 View  06/06/2015   CLINICAL DATA:  Ileus  EXAM: ABDOMEN - 1 VIEW  COMPARISON:  06/05/2015  FINDINGS: Persistent small bowel dilatation is noted although some mild decrease in the small bowel  diameter is noted. Contrast material is again seen throughout colon. No free air is seen. Mild degenerative changes of the lumbar spine are noted.  IMPRESSION: Slight improvement in the degree of small bowel dilatation   Electronically Signed   By: Inez Catalina M.D.   On: 06/06/2015 12:06   ASSESSMENT AND PLAN:  * Uncontrolled HTN: Improving on current regimen. continue metoprolol to 25 mg QID, hydralazine, HCTZ and losartan. Adjust as need, also on prn  iv hydralazine for SBP > 160  * ARF: improving with IVF, monitor  * Pain with urination: Check urinalysis to make sure there is no urine infection  * chronic a. Fib: on eliquis 2.5 mg PO bid at home, can resume tomorrow if ok with surgery. Continue metoprolol for rate control.  * Hypokalemia: repleted and resolved  * SOB: caution with IVF, he's about 7.9 liters positive, on HCTZ (very low dose), recommend stopping IV fluids at this time  * Ileus: NGT out y'day, improving, tolerating CLD, mgmt per surgery.  * diverticulitis: continue IV abx, if no improvement, may require surgery.   All the records are reviewed and case discussed with Care Management/Social Worker. Management plans discussed with the patient, family and they are in agreement.  CODE STATUS: Full Code  TOTAL TIME TAKING CARE OF THIS PATIENT: 20 minutes.   More than 50% of the time was spent in counseling/coordination of care: Augustina Mood, Mylie Mccurley M.D on 06/07/2015 at 11:54 AM  Between 7am to 6pm - Pager - [phone]  After 6pm go to [email or web address] - password [email or web address] [facility]  [facility]  Office  [phone]  CC:  Primary care physician; No primary care provider on file.

## 2015-06-08 ENCOUNTER — Inpatient Hospital Stay: Payer: Medicare Other

## 2015-06-08 LAB — POTASSIUM: Potassium: 3.3 mmol/L — ABNORMAL LOW (ref 3.5–5.1)

## 2015-06-08 LAB — PHOSPHORUS
PHOSPHORUS: 1.4 mg/dL — AB (ref 2.5–4.6)
PHOSPHORUS: 4.5 mg/dL (ref 2.5–4.6)

## 2015-06-08 LAB — BASIC METABOLIC PANEL
ANION GAP: 8 (ref 5–15)
BUN: 24 mg/dL — ABNORMAL HIGH (ref 6–20)
CO2: 24 mmol/L (ref 22–32)
Calcium: 9.6 mg/dL (ref 8.9–10.3)
Chloride: 106 mmol/L (ref 101–111)
Creatinine, Ser: 1.32 mg/dL — ABNORMAL HIGH (ref 0.61–1.24)
GFR calc Af Amer: 53 mL/min — ABNORMAL LOW (ref >60)
GFR, EST NON AFRICAN AMERICAN: 46 mL/min — AB (ref >60)
GLUCOSE: 124 mg/dL — AB (ref 65–99)
POTASSIUM: 3.3 mmol/L — AB (ref 3.5–5.1)
Sodium: 138 mmol/L (ref 135–145)

## 2015-06-08 LAB — CBC WITH DIFFERENTIAL/PLATELET
BASOS PCT: 0 %
Basophils Absolute: 0 10*3/uL (ref 0–0.1)
Eosinophils Absolute: 0.5 10*3/uL (ref 0–0.7)
Eosinophils Relative: 3 %
HEMATOCRIT: 37.9 % — AB (ref 40.0–52.0)
HEMOGLOBIN: 12.8 g/dL — AB (ref 13.0–18.0)
LYMPHS ABS: 0.9 10*3/uL — AB (ref 1.0–3.6)
LYMPHS PCT: 6 %
MCH: 31.5 pg (ref 26.0–34.0)
MCHC: 33.9 g/dL (ref 32.0–36.0)
MCV: 93 fL (ref 80.0–100.0)
MONOS PCT: 8 %
Monocytes Absolute: 1.2 10*3/uL — ABNORMAL HIGH (ref 0.2–1.0)
NEUTROS ABS: 11.9 10*3/uL — AB (ref 1.4–6.5)
NEUTROS PCT: 83 %
Platelets: 198 10*3/uL (ref 150–440)
RBC: 4.07 MIL/uL — ABNORMAL LOW (ref 4.40–5.90)
RDW: 14.8 % — ABNORMAL HIGH (ref 11.5–14.5)
WBC: 14.4 10*3/uL — ABNORMAL HIGH (ref 3.8–10.6)

## 2015-06-08 LAB — MAGNESIUM
Magnesium: 1.5 mg/dL — ABNORMAL LOW (ref 1.7–2.4)
Magnesium: 2.1 mg/dL (ref 1.7–2.4)

## 2015-06-08 LAB — GLUCOSE, CAPILLARY
GLUCOSE-CAPILLARY: 98 mg/dL (ref 65–99)
GLUCOSE-CAPILLARY: 96 mg/dL (ref 65–99)
Glucose-Capillary: 154 mg/dL — ABNORMAL HIGH (ref 65–99)

## 2015-06-08 MED ORDER — FUROSEMIDE 20 MG PO TABS
20.0000 mg | ORAL_TABLET | Freq: Two times a day (BID) | ORAL | Status: DC
  Administered 2015-06-08 – 2015-06-09 (×2): 20 mg via ORAL
  Filled 2015-06-08 (×2): qty 1

## 2015-06-08 MED ORDER — POTASSIUM CHLORIDE CRYS ER 20 MEQ PO TBCR
40.0000 meq | EXTENDED_RELEASE_TABLET | Freq: Once | ORAL | Status: AC
  Administered 2015-06-08: 40 meq via ORAL
  Filled 2015-06-08: qty 2

## 2015-06-08 MED ORDER — PANTOPRAZOLE SODIUM 40 MG PO TBEC
40.0000 mg | DELAYED_RELEASE_TABLET | Freq: Every day | ORAL | Status: DC
  Administered 2015-06-08 – 2015-06-09 (×2): 40 mg via ORAL
  Filled 2015-06-08 (×2): qty 1

## 2015-06-08 MED ORDER — DEXTROSE 5 % IV SOLN
30.0000 mmol | Freq: Once | INTRAVENOUS | Status: AC
  Administered 2015-06-08: 30 mmol via INTRAVENOUS
  Filled 2015-06-08: qty 10

## 2015-06-08 MED ORDER — MAGNESIUM SULFATE 2 GM/50ML IV SOLN
2.0000 g | Freq: Once | INTRAVENOUS | Status: AC
  Administered 2015-06-08: 2 g via INTRAVENOUS
  Filled 2015-06-08: qty 50

## 2015-06-08 MED ORDER — INSULIN ASPART 100 UNIT/ML ~~LOC~~ SOLN
0.0000 [IU] | SUBCUTANEOUS | Status: DC
  Administered 2015-06-08: 2 [IU] via SUBCUTANEOUS
  Administered 2015-06-09: 4 [IU] via SUBCUTANEOUS
  Administered 2015-06-10: 8 [IU] via SUBCUTANEOUS
  Administered 2015-06-10: 2 [IU] via SUBCUTANEOUS
  Administered 2015-06-10 – 2015-06-11 (×6): 4 [IU] via SUBCUTANEOUS
  Administered 2015-06-11: 2 [IU] via SUBCUTANEOUS
  Administered 2015-06-11 (×2): 4 [IU] via SUBCUTANEOUS
  Administered 2015-06-11: 2 [IU] via SUBCUTANEOUS
  Administered 2015-06-11: 4 [IU] via SUBCUTANEOUS
  Administered 2015-06-12: 2 [IU] via SUBCUTANEOUS
  Administered 2015-06-12: 4 [IU] via SUBCUTANEOUS
  Administered 2015-06-12: 8 [IU] via SUBCUTANEOUS
  Administered 2015-06-12: 2 [IU] via SUBCUTANEOUS
  Administered 2015-06-12 – 2015-06-13 (×3): 4 [IU] via SUBCUTANEOUS
  Administered 2015-06-13 (×2): 2 [IU] via SUBCUTANEOUS
  Administered 2015-06-13: 4 [IU] via SUBCUTANEOUS
  Administered 2015-06-13 – 2015-06-14 (×2): 2 [IU] via SUBCUTANEOUS
  Administered 2015-06-14: 4 [IU] via SUBCUTANEOUS
  Administered 2015-06-14: 2 [IU] via SUBCUTANEOUS
  Filled 2015-06-08 (×3): qty 2
  Filled 2015-06-08: qty 8
  Filled 2015-06-08: qty 4
  Filled 2015-06-08 (×2): qty 2
  Filled 2015-06-08 (×5): qty 4
  Filled 2015-06-08: qty 2
  Filled 2015-06-08: qty 8
  Filled 2015-06-08 (×2): qty 4
  Filled 2015-06-08 (×2): qty 2
  Filled 2015-06-08 (×4): qty 4
  Filled 2015-06-08: qty 2
  Filled 2015-06-08: qty 4
  Filled 2015-06-08: qty 2
  Filled 2015-06-08 (×3): qty 4

## 2015-06-08 NOTE — Progress Notes (Signed)
CT scan personally reviewed and discussed with the radiologist Dr. Owens Shark. Stress findings with patient and son. While there is no abscess present the patient has failed to improve considerably and continues to have signs of microperforation and intense inflammatory process. This is involving the small bowel as well as the bladder which appears thickened.  That in mind I have suggested that we operate and form a Hartman's procedure with end colostomy and central line placement. The rationale for this was discussed with the patient and his son the risks of bleeding infection the temporary nature of the colostomy as well as the risk of additional abscess formation was all reviewed with him he understood and agreed to proceed. We will place a nasogastric tube at this time to decompress the small bowel. He will need nutrition support after surgery.

## 2015-06-08 NOTE — Progress Notes (Addendum)
MEDICATION RELATED CONSULT NOTE - FOLLOW UP   Pharmacy Consult for Electrolyte Management, Zosyn, and Apixaban (Eliquis) Indication: Electrolyte Imbalances, Zosyn for IAI, and Apixaban for Afib  No Known Allergies  Patient Measurements: Height: 5\' 5"  (165.1 cm) Weight: 134 lb 1.6 oz (60.827 kg) IBW/kg (Calculated) : 61.5 Adjusted Body Weight: 60.8 kg  Vital Signs: Temp: 98.5 F (36.9 C) (08/26 0818) Temp Source: Oral (08/26 0818) BP: 190/101 mmHg (08/26 0819) Pulse Rate: 87 (08/26 0819)  Labs:  Recent Labs  06/06/15 0429 06/07/15 0530 06/08/15 0424  WBC 19.1* 14.4* 14.4*  HGB 13.7 13.1 12.8*  HCT 39.6* 38.6* 37.9*  PLT 212 201 198  CREATININE 1.42* 1.45* 1.32*  MG 1.7 1.8 1.5*  PHOS 1.6* 1.1* 1.4*   Estimated Creatinine Clearance: 32.6 mL/min (by C-G formula based on Cr of 1.32).   Microbiology: No results found for this or any previous visit (from the past 720 hour(s)).  Medications:  Scheduled:  . apixaban  2.5 mg Oral BID  . brimonidine  2 drop Both Eyes BID  . dorzolamide  2 drop Both Eyes BID  . feeding supplement  1 Container Oral TID WC  . hydrALAZINE  25 mg Oral 3 times per day  . hydrochlorothiazide  12.5 mg Oral Daily  . HYDROmorphone PCA 0.3 mg/mL   Intravenous 6 times per day  . hypromellose   Both Eyes QHS  . latanoprost  2 drop Both Eyes QHS  . losartan  100 mg Oral Daily  . magnesium sulfate 1 - 4 g bolus IVPB  2 g Intravenous Once  . metoprolol succinate  200 mg Oral Daily  . pantoprazole (PROTONIX) IV  40 mg Intravenous QHS  . piperacillin-tazobactam (ZOSYN)  IV  3.375 g Intravenous 3 times per day  . potassium chloride  20 mEq Oral BID  . potassium phosphate IVPB (mmol)  30 mmol Intravenous Once  . timolol  2 drop Both Eyes BID   PRN: acetaminophen **OR** acetaminophen, diphenhydrAMINE **OR** diphenhydrAMINE, hydrALAZINE, iohexol, menthol-cetylpyridinium, naloxone **AND** sodium chloride, ondansetron **OR** ondansetron (ZOFRAN)  IV  Assessment: 1. Electrolytes: Pharmacy consulted to adjust electrolytes on an 81 year old male admitted for acute diverticulitis. Patient is on a clear liquid diet, receiving KCl 71mEq BID and KPhos packets 2 packets QID x4 doses. 2. Zosyn: Patient is on Zosyn for IAI-- Day 6 (start 8/20). WBC 14.4 (down from 19.1 on 8/24). Tmax 98.5.  3. Apixaban: Patient previously on Apixaban 2.5 mg po BID chronically for anticoagulation due to atrial fibrillation. Medication withheld upon admission due to possible surgery.  4. IV to PO: Patient is on pantoprazole 40mg  IV daily. Pt is able to take other medications PO.  Plan:  1. Electrolytes: Replace KPhos 30 mmol IV x1 (which will provide 45 mEq of K). Will continue K 10mEq BID. Replete Mag with 2g IV x1. Will recheck labs at 1700 8/26. Will anticipate further replacement to be PO if needed. 2. Zosyn: Continue Zosyn 3.375mg  EIV q8hrs. 3. Apixaban: Continue with patient's home dose of apixaban 2.5mg  PO BID due to patient's age >59kg and weight ~60kg. Will continue to monitor CBC and Scr during admission. 4. IV to PO: Transition patient from pantoprazole 40mg  IV daily to pantoprazole 40mg  PO daily.   Pharmacy will continue to follow.  Vena Rua 06/08/2015,9:24 AM

## 2015-06-08 NOTE — Care Management Important Message (Signed)
Important Message  Patient Details  Name: Jesse Macias MRN: [id number] Date of Birth: 1924-09-29   Medicare Important Message Given:  Yes-fourth notification given    [name] 06/08/2015, 10:30 AM

## 2015-06-08 NOTE — Progress Notes (Signed)
Initial Nutrition Assessment      INTERVENTION:  PN: Recommend TPN of 5% AA/20% dextrose at goal rate of 89ml/hr with lipids 20% 2 times per week.  Provides 1869 kcals (with lipids) and 90 g of protein. Recommend starting at 11ml/hr, check labs in am, and if tolerating recommend increasing to goal rate.  Would not recommend starting lipids until tolerating TPN at goal rate   NUTRITION DIAGNOSIS:   Inadequate oral intake related to acute illness, altered GI function as evidenced by  (NPO/CL since admission).    GOAL:   Patient will meet greater than or equal to 90% of their needs    MONITOR:    (Energy Intake, Digestive System, Electrolyte/Renal Profile, Anthropometrics)  REASON FOR ASSESSMENT:   Consult New TPN/TNA  ASSESSMENT:       Current Nutrition: Limited intake day 6 of admission   Gastrointestinal Profile: Last BM: 8/26   Medications: reviewed  Electrolyte/Renal Profile and Glucose Profile:   Recent Labs Lab 06/06/15 0429 06/07/15 0530 06/08/15 0424  NA 141 138 138  K 3.1* 3.5 3.3*  CL 110 108 106  CO2 22 22 24   BUN 29* 25* 24*  CREATININE 1.42* 1.45* 1.32*  CALCIUM 9.1 9.4 9.6  MG 1.7 1.8 1.5*  PHOS 1.6* 1.1* 1.4*  GLUCOSE 111* 131* 124*   Protein Profile:   Recent Labs Lab 06/02/15 2016 06/03/15 0602  ALBUMIN 4.6 3.7     Weight Trend since Admission: Filed Weights   06/02/15 2003 06/03/15 0113  Weight: 140 lb (63.504 kg) 134 lb 1.6 oz (60.827 kg)      Diet Order:  Diet clear liquid Room service appropriate?: Yes; Fluid consistency:: Thin Diet NPO time specified  Skin:  Reviewed, no issues  Height:   Ht Readings from Last 1 Encounters:  06/03/15 5\' 5"  (1.651 m)    Weight:   Wt Readings from Last 1 Encounters:  06/03/15 134 lb 1.6 oz (60.827 kg)    Ideal Body Weight:     BMI:  Body mass index is 22.32 kg/(m^2).  Estimated Nutritional Needs:   Kcal:  1709-2019 kcals (1195, 1.3 AF, 1.1-1.3 IF)   Protein:   67-85 g (1.1-1.4 g/kg)   Fluid:  1525-1830 mL (25-30 ml/kg)   EDUCATION NEEDS:   No education needs identified at this time  Rebecca. Zenia Resides, [place], [place] (pager)

## 2015-06-08 NOTE — Progress Notes (Signed)
Discussed care with another family member and with the patient again wife not present. We will discuss in detail with wife in the morning. Questions from the patient or other family members.

## 2015-06-08 NOTE — Progress Notes (Signed)
MEDICATION RELATED CONSULT NOTE - FOLLOW UP   Pharmacy Consult for Electrolyte Management Indication: Electrolyte Imbalances  No Known Allergies  Patient Measurements: Height: 5\' 5"  (165.1 cm) Weight: 134 lb 1.6 oz (60.827 kg) IBW/kg (Calculated) : 61.5 Adjusted Body Weight: 60.8 kg  Vital Signs: Temp: 97.6 F (36.4 C) (08/26 1550) Temp Source: Oral (08/26 1550) BP: 157/87 mmHg (08/26 1550) Pulse Rate: 72 (08/26 1550)  Labs:  Recent Labs  06/06/15 0429 06/07/15 0530 06/08/15 0424 06/08/15 1717  WBC 19.1* 14.4* 14.4*  --   HGB 13.7 13.1 12.8*  --   HCT 39.6* 38.6* 37.9*  --   PLT 212 201 198  --   CREATININE 1.42* 1.45* 1.32*  --   MG 1.7 1.8 1.5* 2.1  PHOS 1.6* 1.1* 1.4* 4.5   Estimated Creatinine Clearance: 32.6 mL/min (by C-G formula based on Cr of 1.32).   Microbiology: No results found for this or any previous visit (from the past 720 hour(s)).  Medications:  Scheduled:  . brimonidine  2 drop Both Eyes BID  . dorzolamide  2 drop Both Eyes BID  . feeding supplement  1 Container Oral TID WC  . furosemide  20 mg Oral BID  . hydrALAZINE  25 mg Oral 3 times per day  . hydrochlorothiazide  12.5 mg Oral Daily  . HYDROmorphone PCA 0.3 mg/mL   Intravenous 6 times per day  . hypromellose   Both Eyes QHS  . insulin aspart  0-24 Units Subcutaneous 6 times per day  . latanoprost  2 drop Both Eyes QHS  . losartan  100 mg Oral Daily  . metoprolol succinate  200 mg Oral Daily  . pantoprazole  40 mg Oral Daily  . piperacillin-tazobactam (ZOSYN)  IV  3.375 g Intravenous 3 times per day  . potassium chloride  20 mEq Oral BID  . timolol  2 drop Both Eyes BID   PRN: acetaminophen **OR** acetaminophen, diphenhydrAMINE **OR** diphenhydrAMINE, hydrALAZINE, iohexol, menthol-cetylpyridinium, naloxone **AND** sodium chloride, ondansetron **OR** ondansetron (ZOFRAN) IV  Assessment: 1. Electrolytes: Pharmacy consulted to adjust electrolytes on an 81 year old male admitted for  acute diverticulitis. Patient is on a clear liquid diet, receiving KCl 70mEq BID and KPhos packets 2 packets QID x4 doses. Patient also received Kphos 30 mmol IV x1 today (which provided 75mEq of K).   Plan:  1. Electrolytes: Mag 2.1 and phos 4.5 on recheck. K still 3.3. Supplement with KCl 66mEq PO x1. Recheck electrolytes with AM labs.   Pharmacy will continue to follow.  Vena Rua 06/08/2015,6:01 PM

## 2015-06-08 NOTE — Progress Notes (Signed)
CC: Acute diverticulitis Subjective: Patient scheduled for CT scan repeat today. Patient is having trouble drinking the liquid I was told that he vomited but on investigation he vomited 15 cc only and was gagging. Now the patient refuses to drink any further contrast.  Patient describes minimal abdominal pain complains more of being forced to drink the liquid. Oh fevers or chills.  Objective: Vital signs in last 24 hours: Temp:  [97.4 F (36.3 C)-98.5 F (36.9 C)] 98.5 F (36.9 C) (08/26 0818) Pulse Rate:  [77-89] 87 (08/26 1004) Resp:  [16-25] 16 (08/26 1150) BP: (149-207)/(76-120) 173/92 mmHg (08/26 1004) SpO2:  [95 %-97 %] 96 % (08/26 1150) Last BM Date: 06/06/15  Intake/Output from previous day: 08/25 0701 - 08/26 0700 In: 1729 [P.O.:1020; I.V.:709] Out: 1000 [Urine:1000] Intake/Output this shift: Total I/O In: 160 [P.O.:50; I.V.:110] Out: 100 [Urine:100]  Physical exam:  Patient appears fairly comfortable abdomen is soft slightly distended minimally tender except in the left lower quadrant where there is moderate tenderness but it overall and improving exam without peritoneal signs. As are nontender  Lab Results: CBC   Recent Labs  06/07/15 0530 06/08/15 0424  WBC 14.4* 14.4*  HGB 13.1 12.8*  HCT 38.6* 37.9*  PLT 201 198   BMET  Recent Labs  06/07/15 0530 06/08/15 0424  NA 138 138  K 3.5 3.3*  CL 108 106  CO2 22 24  GLUCOSE 131* 124*  BUN 25* 24*  CREATININE 1.45* 1.32*  CALCIUM 9.4 9.6   PT/INR No results for input(s): LABPROT, INR in the last 72 hours. ABG No results for input(s): PHART, HCO3 in the last 72 hours.  Invalid input(s): PCO2, PO2  Studies/Results: No results found.  Anti-infectives: Anti-infectives    Start     Dose/Rate Route Frequency Ordered Stop   06/03/15 1000  piperacillin-tazobactam (ZOSYN) IVPB 3.375 g     3.375 g 12.5 mL/hr over 240 Minutes Intravenous 3 times per day 06/02/15 2348     06/03/15 0026   piperacillin-tazobactam (ZOSYN) 3.375 (3-0.375) G injection    Comments:  BRUMGARD, APRIL: cabinet override      06/03/15 0026 06/03/15 0146   06/02/15 2330  piperacillin-tazobactam (ZOSYN) IVPB 3.375 g     3.375 g 100 mL/hr over 30 Minutes Intravenous  Once 06/02/15 2316 06/03/15 0215      Assessment/Plan:  White blood cell count remains stable at 14. I'm trying to obtain a CT scan to assess the severity of his disease in his pelvis. He may require surgery but at this point his fever curve white blood cell count improvement examination improvement and subjective improvement all points and overall picture of someone who does not need a colostomy and the risks of general anesthesia. This reviewed for he and his family they do not want to proceed with surgery either but understand that it may be necessary should he not improve fairly quickly. Review CT scan and discussed with family.  Florene Glen, MD, FACS  06/08/2015

## 2015-06-08 NOTE — Progress Notes (Signed)
[facility] [place] NAME: Jesse Macias    MR#:  [id number]  DATE OF BIRTH:  [birth year]  SUBJECTIVE:  CHIEF COMPLAINT:   Chief Complaint  Patient presents with  . Abdominal Pain    Pt c/o pain across lower abd since 2 pm today. States pain has become severe. Pain is more severe in area of previous hernia repair. +n/v. Denies diarrhea.  not much improvement thus far, family concerned. Waiting for CT, REVIEW OF SYSTEMS:  Review of Systems  Constitutional: Negative for fever, weight loss, malaise/fatigue and diaphoresis.  HENT: Negative for ear discharge, ear pain, hearing loss, nosebleeds, sore throat and tinnitus.   Eyes: Negative for blurred vision and pain.  Respiratory: Negative for cough, hemoptysis, shortness of breath and wheezing.   Cardiovascular: Negative for chest pain, palpitations, orthopnea and leg swelling.  Gastrointestinal: Positive for abdominal pain. Negative for heartburn, nausea, vomiting, diarrhea, constipation and blood in stool.  Genitourinary: Negative for dysuria, urgency and frequency.  Musculoskeletal: Negative for myalgias and back pain.  Skin: Negative for itching and rash.  Neurological: Negative for dizziness, tingling, tremors, focal weakness, seizures, weakness and headaches.  Psychiatric/Behavioral: Negative for depression. The patient is not nervous/anxious.    DRUG ALLERGIES:  No Known Allergies VITALS:  Blood pressure 154/83, pulse 74, temperature 98.5 F (36.9 C), temperature source Oral, resp. rate 16, height 5\' 5"  (1.651 m), weight 60.827 kg (134 lb 1.6 oz), SpO2 98 %. PHYSICAL EXAMINATION:  Physical Exam  Constitutional: He is oriented to person, place, and time and well-developed, well-nourished, and in no distress.  HENT:  Head: Normocephalic and atraumatic.  Eyes: Conjunctivae and EOM are normal. Pupils are equal, round, and reactive to light.  Neck: Normal range of motion. Neck  supple. No tracheal deviation present. No thyromegaly present.  Cardiovascular: Normal rate, regular rhythm and normal heart sounds.   Pulmonary/Chest: Effort normal and breath sounds normal. No respiratory distress. He has no wheezes. He exhibits no tenderness.  Abdominal: Soft. Bowel sounds are normal. He exhibits no distension. There is tenderness.  Musculoskeletal: Normal range of motion.  Neurological: He is alert and oriented to person, place, and time. No cranial nerve deficit.  Skin: Skin is warm and dry. No rash noted.  Psychiatric: Mood and affect normal.   LABORATORY PANEL:   CBC  Recent Labs Lab 06/08/15 0424  WBC 14.4*  HGB 12.8*  HCT 37.9*  PLT 198   ------------------------------------------------------------------------------------------------------------------ Chemistries   Recent Labs Lab 06/03/15 0602  06/08/15 0424  NA 137  < > 138  K 3.1*  < > 3.3*  CL 100*  < > 106  CO2 26  < > 24  GLUCOSE 134*  < > 124*  BUN 29*  < > 24*  CREATININE 1.75*  < > 1.32*  CALCIUM 9.6  < > 9.6  MG  --   < > 1.5*  AST 22  --   --   ALT 12*  --   --   ALKPHOS 49  --   --   BILITOT 1.6*  --   --   < > = values in this interval not displayed. RADIOLOGY:  Ct Abdomen Pelvis W Contrast  06/08/2015   CLINICAL DATA:  History of diverticulitis. AP are onset severe worsening left lower quadrant pain. Nausea, emesis. Unable to drink contrast.  EXAM: CT ABDOMEN AND PELVIS WITH CONTRAST  TECHNIQUE: Multidetector CT imaging of the abdomen and pelvis was performed using  the standard protocol following bolus administration of intravenous contrast.  CONTRAST:  100 cc Omnipaque 300  COMPARISON:  06/02/2015  FINDINGS: Lower chest: Coronary artery calcifications are present. Heart size is normal. The lung bases are unremarkable.  Upper abdomen: The gallbladder is present. Within the caudate lobe there is a cyst measuring 1.6 cm. No suspicious liver lesions. No focal abnormality identified  within the spleen, pancreas, or adrenal glands. The gallbladder is present.  Gastrointestinal tract: The stomach has a normal appearance. There is diffuse dilatation of small bowel loops with transition zone best localized to the left lower quadrant. Findings are consistent with ileus. Contrast reaches the sigmoid and rectum. The distal small bowel loops are normal in caliber.  Within the left lower quadrant there is a small air-fluid collection which measures 5.1 x 3.2 cm. This is adjacent to a segment of sigmoid colon contain numerous diverticula and thickened wall. Findings are consistent with perforated segment of sigmoid colon related to diverticulitis. There is likely secondary inflammation of small bowel loops accounting for the functional obstruction.  Pelvis: Urinary bladder contains Foley catheter. The bladder wall appears slightly thickened and may be secondarily inflamed. There is a small amount of free pelvic fluid. Prostate gland appears slightly prominent in size. Seminal vesicles have a normal appearance.  Retroperitoneum: There is atherosclerosis of the abdominal aorta. No aneurysm.  Abdominal wall: Unremarkable.  Osseous structures: Schmorl's nodes and mild degenerative changes in the spine. No suspicious lytic or blastic lesions are identified.  IMPRESSION: 1. Perforation in the left lower quadrant related to diverticulitis. 2. Secondary inflammation of left lower quadrant small bowel loops creating ileus of small bowel loops. No obstruction. 3. Thickened bladder wall, likely secondarily inflamed. 4. Coronary artery disease. 5. Small liver cyst. 6. Abdominal aortic atherosclerosis. 7. Prostatic enlargement. 8. Critical Value/emergent results were called by telephone at the time of interpretation on 06/08/2015 at 3:07 pm to Dr. Phoebe Perch , who verbally acknowledged these results.   Electronically Signed   By: Nolon Nations M.D.   On: 06/08/2015 15:07   ASSESSMENT AND PLAN:  * Uncontrolled  HTN: ontinue metoprolol to 50 mg QID, Metoprolol XL 200 mg, hydralazine, HCTZ and losartan. Adjust as need, also on prn iv hydralazine for SBP > 160, add lasix 20 mg IV bid  * ARF: improving with IVF, monitor  * Pain with urination: UA not impressive for infection, foley helped symptoms.  * chronic a. Fib: hold eliquis as plan for surgery. Continue metoprolol for rate control.  * Hypokalemia: repleted and resolved  * SOB: start lasix 20 mg IV bid, he's about 8.2 liters positive, on HCTZ (very low dose),   * Ileus: clinically seem to be improving.  * diverticulitis: repeat CT this afternoon shows Perforation in the left lower quadrant likely related to diverticulitis. D/w Dr Copper - he's planning surgery tomorrow.   All the records are reviewed and case discussed with Care Management/Social Worker. Management plans discussed with the patient, family and they are in agreement.  CODE STATUS: Full Code  TOTAL TIME TAKING CARE OF THIS PATIENT: 20 minutes.   More than 50% of the time was spent in counseling/coordination of care: YES (d/w family and Dr Copper)   Manuella Ghazi, Hortensia Duffin M.D on 06/08/2015 at 3:19 PM  Between 7am to 6pm - Pager - [phone]  After 6pm go to [email or web address] - password [email or web address] [facility]  [facility]  Office  [phone]  CC:  Primary care physician; No primary care provider on  file.

## 2015-06-09 ENCOUNTER — Encounter
Admission: EM | Disposition: A | Discharge status: Skilled Nursing Facility | Payer: Self-pay | Source: Home / Self Care | Attending: Surgery

## 2015-06-09 ENCOUNTER — Inpatient Hospital Stay: Payer: Medicare Other

## 2015-06-09 ENCOUNTER — Inpatient Hospital Stay: Payer: Medicare Other | Admitting: Anesthesiology

## 2015-06-09 DIAGNOSIS — K5700 Diverticulitis of small intestine with perforation and abscess without bleeding: Secondary | ICD-10-CM

## 2015-06-09 HISTORY — PX: COLECTOMY WITH COLOSTOMY CREATION/HARTMANN PROCEDURE: SHX6598

## 2015-06-09 HISTORY — PX: CENTRAL VENOUS CATHETER INSERTION: SHX401

## 2015-06-09 LAB — CBC WITH DIFFERENTIAL/PLATELET
BASOS ABS: 0 10*3/uL (ref 0–0.1)
BASOS PCT: 0 %
EOS ABS: 0.7 10*3/uL (ref 0–0.7)
Eosinophils Relative: 4 %
HEMATOCRIT: 38 % — AB (ref 40.0–52.0)
HEMOGLOBIN: 12.8 g/dL — AB (ref 13.0–18.0)
Lymphocytes Relative: 7 %
Lymphs Abs: 1 10*3/uL (ref 1.0–3.6)
MCH: 31.4 pg (ref 26.0–34.0)
MCHC: 33.7 g/dL (ref 32.0–36.0)
MCV: 93.2 fL (ref 80.0–100.0)
MONOS PCT: 9 %
Monocytes Absolute: 1.4 10*3/uL — ABNORMAL HIGH (ref 0.2–1.0)
NEUTROS ABS: 12.5 10*3/uL — AB (ref 1.4–6.5)
NEUTROS PCT: 80 %
Platelets: 212 10*3/uL (ref 150–440)
RBC: 4.08 MIL/uL — AB (ref 4.40–5.90)
RDW: 15 % — ABNORMAL HIGH (ref 11.5–14.5)
WBC: 15.7 10*3/uL — AB (ref 3.8–10.6)

## 2015-06-09 LAB — COMPREHENSIVE METABOLIC PANEL
ALT: 12 U/L — AB (ref 17–63)
AST: 16 U/L (ref 15–41)
Albumin: 2.6 g/dL — ABNORMAL LOW (ref 3.5–5.0)
Alkaline Phosphatase: 46 U/L (ref 38–126)
Anion gap: 11 (ref 5–15)
BUN: 24 mg/dL — AB (ref 6–20)
CHLORIDE: 100 mmol/L — AB (ref 101–111)
CO2: 25 mmol/L (ref 22–32)
Calcium: 9.4 mg/dL (ref 8.9–10.3)
Creatinine, Ser: 1.49 mg/dL — ABNORMAL HIGH (ref 0.61–1.24)
GFR calc Af Amer: 46 mL/min — ABNORMAL LOW (ref >60)
GFR, EST NON AFRICAN AMERICAN: 40 mL/min — AB (ref >60)
Glucose, Bld: 88 mg/dL (ref 65–99)
POTASSIUM: 3.6 mmol/L (ref 3.5–5.1)
SODIUM: 136 mmol/L (ref 135–145)
Total Bilirubin: 1.2 mg/dL (ref 0.3–1.2)
Total Protein: 5.5 g/dL — ABNORMAL LOW (ref 6.5–8.1)

## 2015-06-09 LAB — MAGNESIUM: MAGNESIUM: 1.7 mg/dL (ref 1.7–2.4)

## 2015-06-09 LAB — PHOSPHORUS: PHOSPHORUS: 3.1 mg/dL (ref 2.5–4.6)

## 2015-06-09 LAB — GLUCOSE, CAPILLARY
GLUCOSE-CAPILLARY: 86 mg/dL (ref 65–99)
GLUCOSE-CAPILLARY: 112 mg/dL — AB (ref 65–99)
GLUCOSE-CAPILLARY: 180 mg/dL — AB (ref 65–99)
Glucose-Capillary: 92 mg/dL (ref 65–99)

## 2015-06-09 LAB — MRSA PCR SCREENING: MRSA BY PCR: NEGATIVE

## 2015-06-09 SURGERY — COLECTOMY, WITH COLOSTOMY CREATION
Anesthesia: General

## 2015-06-09 MED ORDER — ONDANSETRON HCL 4 MG/2ML IJ SOLN: 4.0000 mg | Freq: Once | INTRAMUSCULAR | Status: DC | PRN

## 2015-06-09 MED ORDER — HYDROMORPHONE HCL 1 MG/ML IJ SOLN
INTRAMUSCULAR | Status: AC
  Administered 2015-06-09: 0.5 mg via INTRAVENOUS
  Filled 2015-06-09: qty 1

## 2015-06-09 MED ORDER — BUPIVACAINE-EPINEPHRINE 0.25% -1:200000 IJ SOLN
INTRAMUSCULAR | Status: DC | PRN
  Administered 2015-06-09: 30 mL

## 2015-06-09 MED ORDER — LACTATED RINGERS IV SOLN
INTRAVENOUS | Status: DC | PRN
  Administered 2015-06-09 (×2): via INTRAVENOUS

## 2015-06-09 MED ORDER — HYDRALAZINE HCL 20 MG/ML IJ SOLN: 10.0000 mg | Freq: Once | INTRAMUSCULAR | Status: DC

## 2015-06-09 MED ORDER — MIDAZOLAM HCL 2 MG/2ML IJ SOLN
INTRAMUSCULAR | Status: DC | PRN
  Administered 2015-06-09: 2 mg via INTRAVENOUS

## 2015-06-09 MED ORDER — SODIUM CHLORIDE 0.9 % IJ SOLN
INTRAMUSCULAR | Status: AC
  Filled 2015-06-09: qty 50

## 2015-06-09 MED ORDER — M.V.I. ADULT IV INJ
INJECTION | INTRAVENOUS | Status: AC
  Administered 2015-06-09: 18:00:00 via INTRAVENOUS
  Filled 2015-06-09: qty 960

## 2015-06-09 MED ORDER — FENTANYL CITRATE (PF) 100 MCG/2ML IJ SOLN
25.0000 ug | INTRAMUSCULAR | Status: DC | PRN
  Administered 2015-06-09 (×4): 25 ug via INTRAVENOUS

## 2015-06-09 MED ORDER — HYDROMORPHONE HCL 1 MG/ML IJ SOLN
0.2500 mg | INTRAMUSCULAR | Status: DC | PRN
  Administered 2015-06-09 (×5): 0.5 mg via INTRAVENOUS

## 2015-06-09 MED ORDER — SUCCINYLCHOLINE CHLORIDE 20 MG/ML IJ SOLN
INTRAMUSCULAR | Status: DC | PRN
  Administered 2015-06-09: 100 mg via INTRAVENOUS

## 2015-06-09 MED ORDER — LIDOCAINE HCL (CARDIAC) 20 MG/ML IV SOLN
INTRAVENOUS | Status: DC | PRN
  Administered 2015-06-09: 60 mg via INTRAVENOUS

## 2015-06-09 MED ORDER — ROCURONIUM BROMIDE 100 MG/10ML IV SOLN
INTRAVENOUS | Status: DC | PRN
  Administered 2015-06-09: 30 mg via INTRAVENOUS

## 2015-06-09 MED ORDER — BUPIVACAINE-EPINEPHRINE (PF) 0.25% -1:200000 IJ SOLN
INTRAMUSCULAR | Status: AC
  Filled 2015-06-09: qty 30

## 2015-06-09 MED ORDER — ONDANSETRON HCL 4 MG/2ML IJ SOLN
INTRAMUSCULAR | Status: DC | PRN
  Administered 2015-06-09: 4 mg via INTRAVENOUS

## 2015-06-09 MED ORDER — LORAZEPAM 2 MG/ML IJ SOLN: 0.5000 mg | Freq: Four times a day (QID) | INTRAMUSCULAR | Status: DC | PRN

## 2015-06-09 MED ORDER — SODIUM CHLORIDE 0.9 % IJ SOLN
INTRAMUSCULAR | Status: AC
  Filled 2015-06-09: qty 9

## 2015-06-09 MED ORDER — FENTANYL CITRATE (PF) 100 MCG/2ML IJ SOLN
INTRAMUSCULAR | Status: AC
  Filled 2015-06-09: qty 2

## 2015-06-09 MED ORDER — GLYCOPYRROLATE 0.2 MG/ML IJ SOLN
INTRAMUSCULAR | Status: DC | PRN
  Administered 2015-06-09: 0.6 mg via INTRAVENOUS

## 2015-06-09 MED ORDER — PROPOFOL 10 MG/ML IV BOLUS
INTRAVENOUS | Status: DC | PRN
  Administered 2015-06-09: 120 mg via INTRAVENOUS

## 2015-06-09 MED ORDER — NEOSTIGMINE METHYLSULFATE 10 MG/10ML IV SOLN
INTRAVENOUS | Status: DC | PRN
Start: 2015-06-09 — End: 2015-06-09
  Administered 2015-06-09: 3 mg via INTRAVENOUS

## 2015-06-09 MED ORDER — METOPROLOL TARTRATE 1 MG/ML IV SOLN
5.0000 mg | INTRAVENOUS | Status: DC | PRN
  Administered 2015-06-11: 5 mg via INTRAVENOUS
  Filled 2015-06-09: qty 5

## 2015-06-09 MED ORDER — FENTANYL CITRATE (PF) 100 MCG/2ML IJ SOLN
INTRAMUSCULAR | Status: DC | PRN
  Administered 2015-06-09 (×2): 100 ug via INTRAVENOUS
  Administered 2015-06-09: 50 ug via INTRAVENOUS

## 2015-06-09 SURGICAL SUPPLY — 58 items
ADHESIVE MASTISOL STRL (MISCELLANEOUS) ×3 IMPLANT
ARROW CENTRAL LINE KIT ×3 IMPLANT
BARRIER SKIN 2 1/4 (WOUND CARE) ×2 IMPLANT
BARRIER SKIN 2 1/4INCH (WOUND CARE) ×1
CANISTER SUCT 1200ML W/VALVE (MISCELLANEOUS) ×6 IMPLANT
CATH TRAY 16F METER LATEX (MISCELLANEOUS) IMPLANT
CHLORAPREP W/TINT 26ML (MISCELLANEOUS) ×3 IMPLANT
CLOSURE WOUND 1/2 X4 (GAUZE/BANDAGES/DRESSINGS)
COVER CLAMP SIL LG PBX B (MISCELLANEOUS) IMPLANT
DRAIN PENROSE 1/4X12 LTX (DRAIN) ×3 IMPLANT
DRAPE LAPAROTOMY 100X77 ABD (DRAPES) ×3 IMPLANT
DRAPE LEGGINS SURG 28X43 STRL (DRAPES) IMPLANT
DRSG OPSITE POSTOP 4X10 (GAUZE/BANDAGES/DRESSINGS) ×3 IMPLANT
DRSG OPSITE POSTOP 4X8 (GAUZE/BANDAGES/DRESSINGS) ×3 IMPLANT
DRSG TELFA 3X8 NADH (GAUZE/BANDAGES/DRESSINGS) ×3 IMPLANT
ELECT CAUTERY BLADE 6.4 (BLADE) IMPLANT
GAUZE SPONGE 4X4 12PLY STRL (GAUZE/BANDAGES/DRESSINGS) ×3 IMPLANT
GLOVE BIO SURGEON STRL SZ8 (GLOVE) ×3 IMPLANT
GLOVE BIOGEL M 6.5 STRL (GLOVE) ×3 IMPLANT
GLOVE BIOGEL PI IND STRL 7.0 (GLOVE) ×1 IMPLANT
GLOVE BIOGEL PI INDICATOR 7.0 (GLOVE) ×2
GLOVE INDICATOR 8.0 STRL GRN (GLOVE) ×3 IMPLANT
GOWN STRL REUS W/ TWL LRG LVL3 (GOWN DISPOSABLE) ×1 IMPLANT
GOWN STRL REUS W/ TWL XL LVL3 (GOWN DISPOSABLE) ×1 IMPLANT
GOWN STRL REUS W/TWL LRG LVL3 (GOWN DISPOSABLE) ×2
GOWN STRL REUS W/TWL XL LVL3 (GOWN DISPOSABLE) ×2
KIT CATH CVC 3 LUMEN 7FR 8IN (MISCELLANEOUS) ×3 IMPLANT
KIT RM TURNOVER STRD PROC AR (KITS) ×3 IMPLANT
LABEL OR SOLS (LABEL) IMPLANT
NDL SAFETY 22GX1.5 (NEEDLE) ×3 IMPLANT
NS IRRIG 1000ML POUR BTL (IV SOLUTION) ×6 IMPLANT
PACK BASIN MAJOR ARMC (MISCELLANEOUS) ×3 IMPLANT
PACK COLON CLEAN CLOSURE (MISCELLANEOUS) IMPLANT
PAD GROUND ADULT SPLIT (MISCELLANEOUS) ×3 IMPLANT
POUCH DRAIN  2 1/4 MED RED 181 (OSTOMY) ×3 IMPLANT
RELOAD PROXIMATE 30MM BLUE (ENDOMECHANICALS) IMPLANT
RELOAD STAPLER LINEAR PROX 30 (STAPLE) IMPLANT
SEPRAFILM MEMBRANE 5X6 (MISCELLANEOUS) ×3 IMPLANT
SET YANKAUER POOLE SUCT (MISCELLANEOUS) ×3 IMPLANT
SPONGE LAP 18X18 5 PK (GAUZE/BANDAGES/DRESSINGS) ×3 IMPLANT
STAPLER GUN LINEAR PROX 60 (STAPLE) ×3 IMPLANT
STAPLER PROXIMATE 75MM BLUE (STAPLE) ×3 IMPLANT
STAPLER RELOAD LINEAR PROX 30 (STAPLE)
STAPLER SKIN PROX 35W (STAPLE) ×3 IMPLANT
STRIP CLOSURE SKIN 1/2X4 (GAUZE/BANDAGES/DRESSINGS) IMPLANT
SUT MNCRL 3-0 UNDYED SH (SUTURE) ×2 IMPLANT
SUT MONOCRYL 3-0 UNDYED (SUTURE) ×4
SUT PDS AB 1 CT1 27 (SUTURE) ×6 IMPLANT
SUT PDS AB 1 TP1 54 (SUTURE) ×6 IMPLANT
SUT SILK 0 (SUTURE) ×2
SUT SILK 0 30XBRD TIE 6 (SUTURE) ×1 IMPLANT
SUT SILK 3-0 (SUTURE) ×3 IMPLANT
SUT VIC AB 1 CTX 27 (SUTURE) ×9 IMPLANT
SUT VIC AB 3-0 SH 27 (SUTURE) ×14
SUT VIC AB 3-0 SH 27X BRD (SUTURE) ×7 IMPLANT
SUT VIC AB 4-0 FS2 27 (SUTURE) ×6 IMPLANT
SUT VICRYL PLUS ABS 0 54 (SUTURE) ×3 IMPLANT
SYRINGE 10CC LL (SYRINGE) ×3 IMPLANT

## 2015-06-09 NOTE — Progress Notes (Signed)
Nutrition Follow-up       INTERVENTION:  PN: Spoke with Dr. Burt Knack this am regarding TPN.  Recommend starting TPN of 5% AA/20% dextrose at 17ml/hr today, check labs in am and progress to goal rate of 67ml/hr as tolerated.  Recommend pharmacy consult to assist with electrolyte and blood glucose management.   NUTRITION DIAGNOSIS:   Inadequate oral intake related to acute illness, altered GI function as evidenced by  (NPO/CL since admission).    GOAL:   Patient will meet greater than or equal to 90% of their needs    MONITOR:    (Energy Intake, Digestive System, Electrolyte/Renal Profile, Anthropometrics)  REASON FOR ASSESSMENT:   Consult New TPN/TNA  ASSESSMENT:      Planning surgery today with central line placement to start TPN   Current Nutrition: NPO, limited nutrition day 7 of admission   Gastrointestinal Profile: NG tube in place with 833ml output over last 24 hr Last BM: 8/27   Medications: reveiwed  Electrolyte/Renal Profile and Glucose Profile:   Recent Labs Lab 06/07/15 0530 06/08/15 0424 06/08/15 1717 06/09/15 0539  NA 138 138  --  136  K 3.5 3.3* 3.3* 3.6  CL 108 106  --  100*  CO2 22 24  --  25  BUN 25* 24*  --  24*  CREATININE 1.45* 1.32*  --  1.49*  CALCIUM 9.4 9.6  --  9.4  MG 1.8 1.5* 2.1 1.7  PHOS 1.1* 1.4* 4.5 3.1  GLUCOSE 131* 124*  --  88   Protein Profile:  Recent Labs Lab 06/02/15 2016 06/03/15 0602 06/09/15 0539  ALBUMIN 4.6 3.7 2.6*       Weight Trend since Admission: [facility] Weights   06/02/15 2003 06/03/15 0113  Weight: 140 lb (63.504 kg) 134 lb 1.6 oz (60.827 kg)      Diet Order:  Diet NPO time specified  Skin:  Reviewed, no issues  Last BM:  8/24  Height:   Ht Readings from Last 1 Encounters:  06/03/15 5\' 5"  (1.651 m)    Weight:   Wt Readings from Last 1 Encounters:  06/03/15 134 lb 1.6 oz (60.827 kg)     BMI:  Body mass index is 22.32 kg/(m^2).  Estimated Nutritional Needs:   Kcal:   1709-2019 kcals (1195, 1.3 AF, 1.1-1.3 IF)   Protein:  67-85 g (1.1-1.4 g/kg)   Fluid:  1525-1830 mL (25-30 ml/kg)   EDUCATION NEEDS:   No education needs identified at this time  [place]. Zenia Resides, [place], [place] (pager)

## 2015-06-09 NOTE — Op Note (Signed)
06/02/2015 - 06/09/2015  11:00 AM  PATIENT:  Jesse Macias  81 y.o. male  PRE-OPERATIVE DIAGNOSIS:  Acute diverticulitis  POST-OPERATIVE DIAGNOSIS:  Same  PROCEDURE: central line placement  SURGEON:  Florene Glen MD, FACS   ANESTHESIA:   local   Details of Procedure: Patient was under general anesthesia following a Hartman's procedure. He was placed in steep Trendelenburg position and prepped and draped in the sterile fashion. Mask and gown and sterile gloves and sterile technique was utilized. Local and static was infiltrated into the skin and subcutaneous tissues tissues on the left clavicle and then a large bore needle was placed in the left subclavian vein followed by placement of Seldinger wire the introducer dilator was placed over the wire followed by placement of the previously flushed triple-lumen catheter the wire was removed and all ports were aspirated and flushed with saline. The catheter was sutured to the skin of the anterior chest wall with the applied silk suture. Sterile dressing was placed. A postoperative chest film was ordered.   Florene Glen, MD FACS

## 2015-06-09 NOTE — Op Note (Signed)
   Pre-operative Diagnosis: Perforated diverticulitis  Post-operative Diagnosis: Perforated diverticulitis  Surgeon: Phoebe Perch   Assistants: Surgical tech  Anesthesia: Gen. with endotracheal tube    Surgeon: Jerrol Banana. Burt Knack, MD FACS  Anesthesia: Gen. with endotracheal tube    Procedure Details  The patient was seen again in the Holding Room. The benefits, complications, treatment options, and expected outcomes were discussed with the patient. The risks of bleeding, infection, recurrence of symptoms, failure to resolve symptoms,  bowel injury, any of which could require further surgery were reviewed with the patient.   The patient was taken to Operating Room, identified as Jesse Macias and the procedure verified.  A Time Out was held and the above information confirmed.  Prior to the induction of general anesthesia, antibiotic prophylaxis was administered. VTE prophylaxis was in place. General endotracheal anesthesia was then administered and tolerated well. After the induction, the abdomen was prepped with Chloraprep and draped in the sterile fashion. The patient was positioned in the supine position.  A midline incision was utilized to open and explore the abdominal cavity. Extensive diverticulitis with acute inflammatory process was found in the left side of the abdomen and sigmoid area. The sigmoid colon was elevated bluntly off the lateral abdominal wall and the ureter was identified. A TA stapling device was fired across the distal segment past the inflamed colon. And then the proximal side was divided with a GIA stapling device. The mesentery was divided between clamps and tied with 0 silk ligatures large vessels were doubly ligated and divided. The avascular line laterally was taken down further up towards the spleen and the splenic flexure was taken down. This allowed for good mobilization of the colon up to the abdominal wall and there appeared to be adequate length and  viability present.  Once assuring that hemostasis was adequate the area was irrigated with copious amounts of normal saline and again hemostasis is checked and found to be adequate.  A site for the colostomy was chosen and is standard fashion a cruciate incision was made and elevation of the colon through the abdominal wall was performed and appeared viable.   Attention was returned to the abdomen where hemostasis was again found to be adequate and sponge lap needle count was correct therefore local anesthesia was infiltrated in skin and subcutaneous tissues tissues around the incision and then a piece of Seprafilm was placed followed by closure of the abdominal wall with #1 PDS in a running fashion. Skin staples were placed over a Penrose drain in the abdominal wound. And the wound was covered.  Tension was returned to the colostomy which was matured in a standard fashion utilizing 30 Vicryls and was viable with bleeding edges.  Patient thought of this procedure well and sterile dressings were placed and an appliance was placed there is sponge lap needle count was correct and the estimated blood loss was 100 cc.  While the patient was still under general anesthesia central line was placed C separate dictation.  Findings: Acute diverticulitis with perforation no abscess   Estimated Blood Loss: 100 cc         Drains: Penrose drain in midline subcutaneous tissues         Specimens: Sigmoid colon        Complications: None                  Condition: Stable, several chest film ending   Jesse Chasen E. Burt Knack, MD, FACS

## 2015-06-09 NOTE — Progress Notes (Signed)
Per her request, notified Dr Margaretmary Eddy that pt was back in room following surgery; Dr acknowledged, no new orders

## 2015-06-09 NOTE — Progress Notes (Signed)
[place] at [place] NAME: Jesse Macias    MR#:  [id number]  DATE OF BIRTH:  31-Aug-1924  SUBJECTIVE:  CHIEF COMPLAINT:   Chief Complaint  Patient presents with  . Abdominal Pain    Pt c/o pain across lower abd since 2 pm today. States pain has become severe. Pain is more severe in area of previous hernia repair. +n/v. Denies diarrhea.   patient was seen and examined in PACU, still lethargic as he was given IV Dilaudid for pain. Arousable but very lethargic. Left central line area  With some blood REVIEW OF SYSTEMS:  Review of Systems  Unable to perform ROS  DRUG ALLERGIES:  No Known Allergies VITALS:  Blood pressure 136/99, pulse 112, temperature 98.2 F (36.8 C), temperature source Tympanic, resp. rate 18, height 5\' 5"  (1.651 m), weight 60.827 kg (134 lb 1.6 oz), SpO2 98 %. PHYSICAL EXAMINATION:  Physical Exam  HENT:  Head: Normocephalic and atraumatic.  Eyes: Conjunctivae are normal. Pupils are equal, round, and reactive to light.  Neck: Normal range of motion. Neck supple. No tracheal deviation present. No thyromegaly present.  Cardiovascular: Normal rate, regular rhythm and normal heart sounds.   Pulmonary/Chest: Effort normal and breath sounds normal. No respiratory distress. He has no wheezes. He exhibits no tenderness.  Abdominal: Soft. Bowel sounds are normal. He exhibits no distension. There is tenderness.  Status post colectomy and colostomy  Neurological: No cranial nerve deficit.  Lethargic, arousable but not following verbal commands  Skin: Skin is warm and dry. No rash noted.   LABORATORY PANEL:   CBC  Recent Labs Lab 06/09/15 0539  WBC 15.7*  HGB 12.8*  HCT 38.0*  PLT 212   ------------------------------------------------------------------------------------------------------------------ Chemistries   Recent Labs Lab 06/09/15 0539  NA 136  K 3.6  CL 100*  CO2 25  GLUCOSE 88  BUN 24*   CREATININE 1.49*  CALCIUM 9.4  MG 1.7  AST 16  ALT 12*  ALKPHOS 46  BILITOT 1.2   RADIOLOGY:  X-ray Chest Pa Or Ap  06/09/2015   CLINICAL DATA:  Central line placement.  EXAM: CHEST  1 VIEW  COMPARISON:  06/05/2015  FINDINGS: New left subclavian central venous line has its tip in the mid superior vena cava. No pneumothorax.  Mild medial right lung base atelectasis. No lung consolidation or edema.  Orogastric tube is stable passing below the diaphragm into the stomach.  IMPRESSION: 1. Left subclavian central venous line tip lies in the mid superior vena cava. No pneumothorax. No other change from the prior exam. No acute findings in the lungs.   Electronically Signed   By: Lajean Manes M.D.   On: 06/09/2015 11:55   Ct Abdomen Pelvis W Contrast  06/08/2015   CLINICAL DATA:  History of diverticulitis. AP are onset severe worsening left lower quadrant pain. Nausea, emesis. Unable to drink contrast.  EXAM: CT ABDOMEN AND PELVIS WITH CONTRAST  TECHNIQUE: Multidetector CT imaging of the abdomen and pelvis was performed using the standard protocol following bolus administration of intravenous contrast.  CONTRAST:  100 cc Omnipaque 300  COMPARISON:  06/02/2015  FINDINGS: Lower chest: Coronary artery calcifications are present. Heart size is normal. The lung bases are unremarkable.  Upper abdomen: The gallbladder is present. Within the caudate lobe there is a cyst measuring 1.6 cm. No suspicious liver lesions. No focal abnormality identified within the spleen, pancreas, or adrenal glands. The gallbladder is present.  Gastrointestinal tract: The stomach  has a normal appearance. There is diffuse dilatation of small bowel loops with transition zone best localized to the left lower quadrant. Findings are consistent with ileus. Contrast reaches the sigmoid and rectum. The distal small bowel loops are normal in caliber.  Within the left lower quadrant there is a small air-fluid collection which measures 5.1 x 3.2  cm. This is adjacent to a segment of sigmoid colon contain numerous diverticula and thickened wall. Findings are consistent with perforated segment of sigmoid colon related to diverticulitis. There is likely secondary inflammation of small bowel loops accounting for the functional obstruction.  Pelvis: Urinary bladder contains Foley catheter. The bladder wall appears slightly thickened and may be secondarily inflamed. There is a small amount of free pelvic fluid. Prostate gland appears slightly prominent in size. Seminal vesicles have a normal appearance.  Retroperitoneum: There is atherosclerosis of the abdominal aorta. No aneurysm.  Abdominal wall: Unremarkable.  Osseous structures: Schmorl's nodes and mild degenerative changes in the spine. No suspicious lytic or blastic lesions are identified.  IMPRESSION: 1. Perforation in the left lower quadrant related to diverticulitis. 2. Secondary inflammation of left lower quadrant small bowel loops creating ileus of small bowel loops. No obstruction. 3. Thickened bladder wall, likely secondarily inflamed. 4. Coronary artery disease. 5. Small liver cyst. 6. Abdominal aortic atherosclerosis. 7. Prostatic enlargement. 8. Critical Value/emergent results were called by telephone at the time of interpretation on 06/08/2015 at 3:07 pm to Dr. Phoebe Perch , who verbally acknowledged these results.   Electronically Signed   By: Nolon Nations M.D.   On: 06/08/2015 15:07   ASSESSMENT AND PLAN:  * Uncontrolled HTN: Probably from anxiety and uncontrolled pain.  continue metoprolol to 50 mg QID,  hydralazine, HCTZ and losartan. Adjust as need, also on prn iv hydralazine for SBP > 160, lasix 20 mg  Bid Will add small doses of anxiolytics and pain management per surgery  * ARF: improving with IVF, monitor. Check BMP in a.m.  * Pain with urination: UA not impressive for infection, foley helped symptoms.  * chronic a. Fib: Held eliquis  for surgery. Continue metoprolol for  rate control.  * Hypokalemia: repleted and resolved, check BMP in a.m.  * SOB:  lasix 20 mg IV bid, he's about 8.2 liters positive, on HCTZ (very low dose),   * Ileus: clinically seem to be improving., NG tube  * diverticulitis: repeat CT this afternoon shows Perforation in the left lower quadrant likely related to diverticulitis. Patient is status post colectomy and colostomy today-post op day #0, status post central line placement   All the records are reviewed and case discussed with Care Management/Social Worker. Management plans discussed with the patient, family and they are in agreement.  CODE STATUS: Full Code  TOTAL TIME TAKING CARE OF THIS PATIENT: 73minutes.   More than 50% of the time was spent in counseling/coordination of care: YES (d/w family and RN )   Nicholes Mango M.D on 06/09/2015 at 1:04 PM  Between 7am to 6pm - Pager - [phone]  After 6pm go to [email or web address] - password [email or web address] [facility]  [place] [facility]  Office  [phone]  CC:  Primary care physician; No primary care provider on file.

## 2015-06-09 NOTE — Progress Notes (Signed)
Patient feels better today.'s wife and family are present today. He denies nausea or vomiting but an NG tube is been placed to decompress the dilated small bowel.  Patient is afebrile vital signs are stable.  Abdomen is soft and essentially unchanged although slight improvement is noted he is still tender in the left lower quadrant.  Again I discussed with the patient and family including wife the options of continued observation. However I feel the patient has failed to progress and has gone without nutrition is not showing any sign of improvement to the point of being able to transition to oral antibiotic's at this point. With all this in mind I discussed the options of surgical intervention and the patient and family are in agreement with this understanding that 8 will result in a temporary colostomy. I discussed the procedure itself in detail I also discussed the risk of bleeding infection abscess formation colostomy and additional surgery then understood and agreed to proceed with plan this morning.

## 2015-06-09 NOTE — Progress Notes (Signed)
PARENTERAL NUTRITION CONSULT NOTE - INITIAL  Pharmacy Consult for Electrolyte/Glucose Management Indication: TPN  No Known Allergies  Patient Measurements: Height: 5\' 5"  (165.1 cm) Weight: 134 lb 1.6 oz (60.827 kg) IBW/kg (Calculated) : 61.5 Adjusted Body Weight: na  Usual Weight: na  Vital Signs: Temp: 98.2 F (36.8 C) (08/27 1044) Temp Source: Tympanic (08/27 1044) BP: 136/99 mmHg (08/27 1245) Pulse Rate: 112 (08/27 1245) Intake/Output from previous day: 08/26 0701 - 08/27 0700 In: 318.2 [P.O.:50; I.V.:268.2] Out: 1700 [Urine:850; Emesis/NG output:850] Intake/Output from this shift: Total I/O In: 2300 [I.V.:2300] Out: 840 [Urine:740; Emesis/NG output:50; Blood:50]  Labs:  Recent Labs  06/07/15 0530 06/08/15 0424 06/09/15 0539  WBC 14.4* 14.4* 15.7*  HGB 13.1 12.8* 12.8*  HCT 38.6* 37.9* 38.0*  PLT 201 198 212     Recent Labs  06/07/15 0530 06/08/15 0424 06/08/15 1717 06/09/15 0539  NA 138 138  --  136  K 3.5 3.3* 3.3* 3.6  CL 108 106  --  100*  CO2 22 24  --  25  GLUCOSE 131* 124*  --  88  BUN 25* 24*  --  24*  CREATININE 1.45* 1.32*  --  1.49*  CALCIUM 9.4 9.6  --  9.4  MG 1.8 1.5* 2.1 1.7  PHOS 1.1* 1.4* 4.5 3.1  PROT  --   --   --  5.5*  ALBUMIN  --   --   --  2.6*  AST  --   --   --  16  ALT  --   --   --  12*  ALKPHOS  --   --   --  46  BILITOT  --   --   --  1.2   Estimated Creatinine Clearance: 28.9 mL/min (by C-G formula based on Cr of 1.49).    Recent Labs  06/08/15 2317 06/09/15 0340 06/09/15 0738  GLUCAP 96 86 92    Medical History: Past Medical History  Diagnosis Date  . Hypertension   . History of hiatal hernia     Medications:  Infusions:  . Marland KitchenTPN (CLINIMIX-E) Adult    . lactated ringers 10 mL/hr at 06/07/15 1432    Insulin Requirements in the past 24 hours:  Novolog 2 units, FSBS less than 154  Current Nutrition:  Patient to start Clinimix E 5/20% at 84ml/hr  Assessment: Electrolytes currently within  normal limits.  Plan:  Will follow up on am labs.  Paulina Fusi, PharmD, BCPS 06/09/2015 1:30 PM

## 2015-06-09 NOTE — Anesthesia Preprocedure Evaluation (Addendum)
Anesthesia Evaluation  Patient identified by MRN, date of birth, ID band Patient awake    Reviewed: Allergy & Precautions, NPO status , Patient's Chart, lab work & pertinent test results  Airway Mallampati: II  TM Distance: >3 FB Neck ROM: Full    Dental  (+) Implants, Edentulous Lower   Pulmonary          Cardiovascular hypertension, Pt. on medications     Neuro/Psych    GI/Hepatic hiatal hernia, Diverticulitis   Endo/Other    Renal/GU      Musculoskeletal   Abdominal   Peds  Hematology   Anesthesia Other Findings   Reproductive/Obstetrics                            Anesthesia Physical Anesthesia Plan  ASA: II and emergent  Anesthesia Plan: General   Post-op Pain Management:    Induction: Intravenous and Rapid sequence  Airway Management Planned: Oral ETT  Additional Equipment:   Intra-op Plan:   Post-operative Plan:   Informed Consent: I have reviewed the patients History and Physical, chart, labs and discussed the procedure including the risks, benefits and alternatives for the proposed anesthesia with the patient or authorized representative who has indicated his/her understanding and acceptance.     Plan Discussed with:   Anesthesia Plan Comments:        Anesthesia Quick Evaluation

## 2015-06-09 NOTE — Transfer of Care (Signed)
Immediate Anesthesia Transfer of Care Note  Patient: Jesse Macias  Procedure(s) Performed: Procedure(s): COLECTOMY WITH COLOSTOMY CREATION/HARTMANN PROCEDURE (N/A) INSERTION CENTRAL LINE ADULT (N/A)  Patient Location: PACU  Anesthesia Type:General  Level of Consciousness: awake  Airway & Oxygen Therapy: Patient connected to face mask oxygen  Post-op Assessment: Report given to RN  Post vital signs: stable  Last Vitals:  Filed Vitals:   06/09/15 1044  BP: 176/76  Pulse: 110  Temp: 36.8 C  Resp: 16    Complications: No apparent anesthesia complications

## 2015-06-09 NOTE — Anesthesia Procedure Notes (Signed)
Procedure Name: Intubation Date/Time: 06/09/2015 9:04 AM Performed by: [id number], Aryianna Earwood Pre-anesthesia Checklist: Patient identified, Timeout performed, Patient being monitored, Suction available and Emergency Drugs available Patient Re-evaluated:Patient Re-evaluated prior to inductionOxygen Delivery Method: Circle system utilized Preoxygenation: Pre-oxygenation with 100% oxygen Intubation Type: IV induction and Rapid sequence Laryngoscope Size: Mac and 3 Grade View: Grade I Tube type: Oral Tube size: 7.5 mm Number of attempts: 1 Placement Confirmation: ETT inserted through vocal cords under direct vision,  positive ETCO2,  CO2 detector and breath sounds checked- equal and bilateral Secured at: 22 cm Tube secured with: Tape

## 2015-06-09 NOTE — Anesthesia Postprocedure Evaluation (Signed)
  Anesthesia Post-op Note  Patient: Jesse Macias  Procedure(s) Performed: Procedure(s): COLECTOMY WITH COLOSTOMY CREATION/HARTMANN PROCEDURE (N/A) INSERTION CENTRAL LINE ADULT (N/A)  Anesthesia type:General  Patient location: PACU  Post pain: Pain level controlled  Post assessment: Post-op Vital signs reviewed, Patient's Cardiovascular Status Stable, Respiratory Function Stable, Patent Airway and No signs of Nausea or vomiting  Post vital signs: Reviewed and stable  Last Vitals:  Filed Vitals:   06/09/15 1044  BP: 176/76  Pulse: 110  Temp: 36.8 C  Resp: 16    Level of consciousness: awake, alert  and patient cooperative  Complications: No apparent anesthesia complications

## 2015-06-10 LAB — GLUCOSE, CAPILLARY
GLUCOSE-CAPILLARY: 184 mg/dL — AB (ref 65–99)
GLUCOSE-CAPILLARY: 187 mg/dL — AB (ref 65–99)
GLUCOSE-CAPILLARY: 188 mg/dL — AB (ref 65–99)
Glucose-Capillary: 224 mg/dL — ABNORMAL HIGH (ref 65–99)
Glucose-Capillary: 148 mg/dL — ABNORMAL HIGH (ref 65–99)
Glucose-Capillary: 155 mg/dL — ABNORMAL HIGH (ref 65–99)
Glucose-Capillary: 143 mg/dL — ABNORMAL HIGH (ref 65–99)
Glucose-Capillary: 162 mg/dL — ABNORMAL HIGH (ref 65–99)

## 2015-06-10 LAB — COMPREHENSIVE METABOLIC PANEL
ALT: 14 U/L — ABNORMAL LOW (ref 17–63)
ANION GAP: 7 (ref 5–15)
AST: 23 U/L (ref 15–41)
Albumin: 2.4 g/dL — ABNORMAL LOW (ref 3.5–5.0)
Alkaline Phosphatase: 46 U/L (ref 38–126)
BILIRUBIN TOTAL: 1.5 mg/dL — AB (ref 0.3–1.2)
BUN: 28 mg/dL — ABNORMAL HIGH (ref 6–20)
CHLORIDE: 101 mmol/L (ref 101–111)
CO2: 28 mmol/L (ref 22–32)
Calcium: 8.9 mg/dL (ref 8.9–10.3)
Creatinine, Ser: 1.42 mg/dL — ABNORMAL HIGH (ref 0.61–1.24)
GFR calc Af Amer: 49 mL/min — ABNORMAL LOW (ref >60)
GFR, EST NON AFRICAN AMERICAN: 42 mL/min — AB (ref >60)
Glucose, Bld: 199 mg/dL — ABNORMAL HIGH (ref 65–99)
POTASSIUM: 3.5 mmol/L (ref 3.5–5.1)
Sodium: 136 mmol/L (ref 135–145)
TOTAL PROTEIN: 5.1 g/dL — AB (ref 6.5–8.1)

## 2015-06-10 LAB — MAGNESIUM: MAGNESIUM: 1.5 mg/dL — AB (ref 1.7–2.4)

## 2015-06-10 LAB — CBC WITH DIFFERENTIAL/PLATELET
BASOS ABS: 0.1 10*3/uL (ref 0–0.1)
BASOS PCT: 0 %
EOS ABS: 0.1 10*3/uL (ref 0–0.7)
Eosinophils Relative: 1 %
HCT: 37.1 % — ABNORMAL LOW (ref 40.0–52.0)
HEMOGLOBIN: 12.6 g/dL — AB (ref 13.0–18.0)
Lymphocytes Relative: 4 %
Lymphs Abs: 0.8 10*3/uL — ABNORMAL LOW (ref 1.0–3.6)
MCH: 31.6 pg (ref 26.0–34.0)
MCHC: 33.8 g/dL (ref 32.0–36.0)
MCV: 93.5 fL (ref 80.0–100.0)
MONO ABS: 1.1 10*3/uL — AB (ref 0.2–1.0)
MONOS PCT: 6 %
NEUTROS ABS: 18.2 10*3/uL — AB (ref 1.4–6.5)
NEUTROS PCT: 89 %
Platelets: 263 10*3/uL (ref 150–440)
RBC: 3.97 MIL/uL — ABNORMAL LOW (ref 4.40–5.90)
RDW: 14.9 % — AB (ref 11.5–14.5)
WBC: 20.4 10*3/uL — ABNORMAL HIGH (ref 3.8–10.6)

## 2015-06-10 LAB — TRIGLYCERIDES: TRIGLYCERIDES: 119 mg/dL (ref <150)

## 2015-06-10 LAB — PHOSPHORUS: PHOSPHORUS: 2.3 mg/dL — AB (ref 2.5–4.6)

## 2015-06-10 MED ORDER — TRACE MINERALS CR-CU-MN-SE-ZN 10-1000-500-60 MCG/ML IV SOLN
INTRAVENOUS | Status: AC
  Administered 2015-06-10: 17:00:00 via INTRAVENOUS
  Filled 2015-06-10: qty 1800

## 2015-06-10 MED ORDER — SODIUM PHOSPHATE 3 MMOLE/ML IV SOLN
15.0000 mmol | Freq: Once | INTRAVENOUS | Status: AC
  Administered 2015-06-10: 15 mmol via INTRAVENOUS
  Filled 2015-06-10: qty 5

## 2015-06-10 MED ORDER — MAGNESIUM SULFATE 2 GM/50ML IV SOLN
2.0000 g | Freq: Once | INTRAVENOUS | Status: AC
  Administered 2015-06-10: 2 g via INTRAVENOUS
  Filled 2015-06-10: qty 50

## 2015-06-10 NOTE — Progress Notes (Signed)
Nutrition Follow-up     INTERVENTION:  PN: Recommend increasing TPN of 5%AA/20% dextrose to goal rate of 26ml/hr to meet nutritional needs.  Pharmacy following electrolytes and blood glucose.   NUTRITION DIAGNOSIS:   Inadequate oral intake related to acute illness, altered GI function as evidenced by  (NPO/CL since admission).    GOAL:   Patient will meet greater than or equal to 90% of their needs    MONITOR:    (Energy Intake, Digestive System, Electrolyte/Renal Profile, Anthropometrics)  REASON FOR ASSESSMENT:   Consult New TPN/TNA  ASSESSMENT:      Pt s/p Hartmann's procedure and central line placement   Current Nutrition: NPO  Gastrointestinal Profile: ostomy viable with no output, abdomen soft   Urine output: 1147ml last 24 hr  Medications: mg  Sulfate, KCL, Na phosphate  Electrolyte/Renal Profile and Glucose Profile:   Recent Labs Lab 06/08/15 0424 06/08/15 1717 06/09/15 0539 06/10/15 0552  NA 138  --  136 136  K 3.3* 3.3* 3.6 3.5  CL 106  --  100* 101  CO2 24  --  25 28  BUN 24*  --  24* 28*  CREATININE 1.32*  --  1.49* 1.42*  CALCIUM 9.6  --  9.4 8.9  MG 1.5* 2.1 1.7 1.5*  PHOS 1.4* 4.5 3.1 2.3*  GLUCOSE 124*  --  88 199*   Protein Profile:  Recent Labs Lab 06/09/15 0539 06/10/15 0552  ALBUMIN 2.6* 2.4*      Weight Trend since Admission: Filed Weights   06/03/15 0113 06/09/15 1620 06/10/15 0419  Weight: 134 lb 1.6 oz (60.827 kg) 135 lb 4.8 oz (61.372 kg) 138 lb 1.6 oz (62.642 kg)      Diet Order:  .TPN (CLINIMIX-E) Adult Diet NPO time specified Except for: Sips with Meds  Skin:  Reviewed, no issues   Height:   Ht Readings from Last 1 Encounters:  06/03/15 5\' 5"  (1.651 m)    Weight:   Wt Readings from Last 1 Encounters:  06/10/15 138 lb 1.6 oz (62.642 kg)      BMI:  Body mass index is 22.98 kg/(m^2).  Estimated Nutritional Needs:   Kcal:  1709-2019 kcals (1195, 1.3 AF, 1.1-1.3 IF)   Protein:  67-85 g  (1.1-1.4 g/kg)   Fluid:  1525-1830 mL (25-30 ml/kg)   EDUCATION NEEDS:   No education needs identified at this time  [place]. Zenia Resides, [place], [place] (pager)

## 2015-06-10 NOTE — Progress Notes (Signed)
[facility] [place] NAME: Jesse Macias    MR#:  [id number]  DATE OF BIRTH:  1924/03/29  SUBJECTIVE:  CHIEF COMPLAINT:   Chief Complaint  Patient presents with  . Abdominal Pain    Pt c/o pain across lower abd since 2 pm today. States pain has become severe. Pain is more severe in area of previous hernia repair. +n/v. Denies diarrhea.   patient was seen and examined at bedside, lethargic from Dilaudid PCA. Arousable but very lethargic. Left central line area  With no new bleeding REVIEW OF SYSTEMS:  Review of Systems  Unable to perform ROS  DRUG ALLERGIES:  No Known Allergies VITALS:  Blood pressure 134/74, pulse 100, temperature 98 F (36.7 C), temperature source Oral, resp. rate 18, height 5\' 5"  (1.651 m), weight 62.642 kg (138 lb 1.6 oz), SpO2 97 %. PHYSICAL EXAMINATION:  Physical Exam  HENT:  Head: Normocephalic and atraumatic.  Eyes: Conjunctivae are normal. Pupils are equal, round, and reactive to light.  Neck: Neck supple. No tracheal deviation present. No thyromegaly present.  Cardiovascular: Normal rate, regular rhythm and normal heart sounds.   Pulmonary/Chest: Effort normal and breath sounds normal. No respiratory distress. He has no wheezes. He exhibits no tenderness.  Abdominal: Soft. Bowel sounds are normal. He exhibits no distension. There is tenderness.  Status post colectomy and colostomy  Neurological: No cranial nerve deficit.  Lethargic, arousable but not following verbal commands  Skin: Skin is warm and dry. No rash noted.   LABORATORY PANEL:   CBC  Recent Labs Lab 06/10/15 0552  WBC 20.4*  HGB 12.6*  HCT 37.1*  PLT 263   ------------------------------------------------------------------------------------------------------------------ Chemistries   Recent Labs Lab 06/10/15 0552  NA 136  K 3.5  CL 101  CO2 28  GLUCOSE 199*  BUN 28*  CREATININE 1.42*  CALCIUM 8.9  MG 1.5*  AST 23   ALT 14*  ALKPHOS 46  BILITOT 1.5*   RADIOLOGY:  No results found. ASSESSMENT AND PLAN:  * Uncontrolled HTN:  Probably from anxiety and pain Better today with Dilaudid PCA and anxiolytics when necessary  continue metoprolol to 50 mg QID,  hydralazine, HCTZ and losartan. Adjust as need, also on prn iv hydralazine for SBP > 160, lasix 20 mg  Bid   * ARF: improving with IVF, monitor. Check BMP in a.m.  * Pain with urination: UA not impressive for infection, foley helped symptoms.  * chronic a. Fib: Held eliquis  for surgery. Continue metoprolol for rate control. Will consider to resume Eliquis if okay with surgery  * Hypokalemia: repleted and resolved, check BMP in a.m.  * SOB:  lasix 20 mg IV bid, on HCTZ (very low dose),   * Ileus: clinically seem to be improving., NG tube  * diverticulitis: repeat CT this afternoon shows Perforation in the left lower quadrant likely related to diverticulitis. Patient is status post colectomy and colostomy today-post op day #1  status post central line placement Continue TPN   All the records are reviewed and case discussed with Care Management/Social Worker. Management plans discussed with the patient, family and they are in agreement.  CODE STATUS: Full Code  TOTAL TIME TAKING CARE OF THIS PATIENT: 51minutes.   More than 50% of the time was spent in counseling/coordination of care: YES (d/w family and RN )   Nicholes Mango M.D on 06/10/2015 at 2:55 PM  Between 7am to 6pm - Pager - [phone]  After 6pm  go to [email or web address] - password [email or web address] [facility]  [facility]  Office  [phone]  CC:  Primary care physician; No primary care provider on file.

## 2015-06-10 NOTE — Progress Notes (Addendum)
PARENTERAL NUTRITION CONSULT NOTE - INITIAL  Pharmacy Consult for Electrolyte/Glucose Management Indication: TPN  No Known Allergies  Patient Measurements: Height: 5\' 5"  (165.1 cm) Weight: 138 lb 1.6 oz (62.642 kg) IBW/kg (Calculated) : 61.5 Adjusted Body Weight: na  Usual Weight: na  Vital Signs: Temp: 97.5 F (36.4 C) (08/28 0419) Temp Source: Oral (08/28 0419) BP: 156/97 mmHg (08/28 0419) Pulse Rate: 105 (08/28 0419) Intake/Output from previous day: 08/27 0701 - 08/28 0700 In: 2684 [I.V.:2375; NG/GT:40; IV Piggyback:15; TPN:254] Out: 1215 [Urine:1015; Emesis/NG output:150; Blood:50] Intake/Output from this shift: Total I/O In: 384 [I.V.:75; NG/GT:40; IV Piggyback:15; TPN:254] Out: 375 [Urine:275; Emesis/NG output:100]  Labs:  Recent Labs  06/08/15 0424 06/09/15 0539 06/10/15 0552  WBC 14.4* 15.7* 20.4*  HGB 12.8* 12.8* 12.6*  HCT 37.9* 38.0* 37.1*  PLT 198 212 263     Recent Labs  06/08/15 0424 06/08/15 1717 06/09/15 0539 06/10/15 0552  NA 138  --  136 136  K 3.3* 3.3* 3.6 3.5  CL 106  --  100* 101  CO2 24  --  25 28  GLUCOSE 124*  --  88 199*  BUN 24*  --  24* 28*  CREATININE 1.32*  --  1.49* 1.42*  CALCIUM 9.6  --  9.4 8.9  MG 1.5* 2.1 1.7 1.5*  PHOS 1.4* 4.5 3.1 2.3*  PROT  --   --  5.5* 5.1*  ALBUMIN  --   --  2.6* 2.4*  AST  --   --  16 23  ALT  --   --  12* 14*  ALKPHOS  --   --  46 46  BILITOT  --   --  1.2 1.5*   Estimated Creatinine Clearance: 30.7 mL/min (by C-G formula based on Cr of 1.42).    Recent Labs  06/09/15 2018 06/10/15 0019 06/10/15 0431  GLUCAP 180* 188* 187*    Medical History: Past Medical History  Diagnosis Date  . Hypertension   . History of hiatal hernia     Medications:  Infusions:  . Marland KitchenTPN (CLINIMIX-E) Adult 40 mL/hr at 06/09/15 1744  . lactated ringers 10 mL/hr at 06/07/15 1432    Insulin Requirements in the past 24 hours:  Novolog 2 units, FSBS less than 154  Current Nutrition:  Patient to  start Clinimix E 5/20% at 96ml/hr  Assessment: Electrolytes currently within normal limits.  Plan:  Will follow up on am labs.  8/28 AM electrolyte panel. Mg 1.5 and PO4 2.3. Magnesium sulfate 2 grams IV once and sodium phosphate 15 mmol IV once ordered. Recheck in AM.  Sim Boast, PharmD, BCPS  06/10/2015

## 2015-06-10 NOTE — Progress Notes (Signed)
1 Day Post-Op  Subjective: Status post Hartman's procedure for perforated diverticulitis and central line placement. He is feeling better today is more awake and alert. Abdominal pain is minimal. No ostomy output.  Objective: Vital signs in last 24 hours: Temp:  [96.1 F (35.6 C)-98.2 F (36.8 C)] 98.1 F (36.7 C) (08/28 0802) Pulse Rate:  [76-121] 109 (08/28 0802) Resp:  [9-26] 23 (08/28 0810) BP: (119-187)/(76-120) 147/89 mmHg (08/28 0802) SpO2:  [96 %-100 %] 97 % (08/28 0810) FiO2 (%):  [0 %] 0 % (08/27 2000) Weight:  [135 lb 4.8 oz (61.372 kg)-138 lb 1.6 oz (62.642 kg)] 138 lb 1.6 oz (62.642 kg) (08/28 0419) Last BM Date: 06/08/15  Intake/Output from previous day: 08/27 0701 - 08/28 0700 In: 2978 [I.V.:2429; NG/GT:40; IV Piggyback:40; TPN:469] Out: [id number] [Urine:1115; Emesis/NG output:210; Blood:50] Intake/Output this shift:    Physical exam:  Ostomy viable but not functional yet abdomen is soft and otherwise nontender wound is dressed. Penrose is in the midline wound. Abs are nontender  Lab Results: CBC   Recent Labs  06/09/15 0539 06/10/15 0552  WBC 15.7* 20.4*  HGB 12.8* 12.6*  HCT 38.0* 37.1*  PLT 212 263   BMET  Recent Labs  06/09/15 0539 06/10/15 0552  NA 136 136  K 3.6 3.5  CL 100* 101  CO2 25 28  GLUCOSE 88 199*  BUN 24* 28*  CREATININE 1.49* 1.42*  CALCIUM 9.4 8.9   PT/INR No results for input(s): LABPROT, INR in the last 72 hours. ABG No results for input(s): PHART, HCO3 in the last 72 hours.  Invalid input(s): PCO2, PO2  Studies/Results: X-ray Chest Pa Or Ap  06/09/2015   CLINICAL DATA:  Central line placement.  EXAM: CHEST  1 VIEW  COMPARISON:  06/05/2015  FINDINGS: New left subclavian central venous line has its tip in the mid superior vena cava. No pneumothorax.  Mild medial right lung base atelectasis. No lung consolidation or edema.  Orogastric tube is stable passing below the diaphragm into the stomach.  IMPRESSION: 1. Left  subclavian central venous line tip lies in the mid superior vena cava. No pneumothorax. No other change from the prior exam. No acute findings in the lungs.   Electronically Signed   By: Lajean Manes M.D.   On: 06/09/2015 11:55   Ct Abdomen Pelvis W Contrast  06/08/2015   CLINICAL DATA:  History of diverticulitis. AP are onset severe worsening left lower quadrant pain. Nausea, emesis. Unable to drink contrast.  EXAM: CT ABDOMEN AND PELVIS WITH CONTRAST  TECHNIQUE: Multidetector CT imaging of the abdomen and pelvis was performed using the standard protocol following bolus administration of intravenous contrast.  CONTRAST:  100 cc Omnipaque 300  COMPARISON:  06/02/2015  FINDINGS: Lower chest: Coronary artery calcifications are present. Heart size is normal. The lung bases are unremarkable.  Upper abdomen: The gallbladder is present. Within the caudate lobe there is a cyst measuring 1.6 cm. No suspicious liver lesions. No focal abnormality identified within the spleen, pancreas, or adrenal glands. The gallbladder is present.  Gastrointestinal tract: The stomach has a normal appearance. There is diffuse dilatation of small bowel loops with transition zone best localized to the left lower quadrant. Findings are consistent with ileus. Contrast reaches the sigmoid and rectum. The distal small bowel loops are normal in caliber.  Within the left lower quadrant there is a small air-fluid collection which measures 5.1 x 3.2 cm. This is adjacent to a segment of sigmoid colon contain numerous diverticula  and thickened wall. Findings are consistent with perforated segment of sigmoid colon related to diverticulitis. There is likely secondary inflammation of small bowel loops accounting for the functional obstruction.  Pelvis: Urinary bladder contains Foley catheter. The bladder wall appears slightly thickened and may be secondarily inflamed. There is a small amount of free pelvic fluid. Prostate gland appears slightly  prominent in size. Seminal vesicles have a normal appearance.  Retroperitoneum: There is atherosclerosis of the abdominal aorta. No aneurysm.  Abdominal wall: Unremarkable.  Osseous structures: Schmorl's nodes and mild degenerative changes in the spine. No suspicious lytic or blastic lesions are identified.  IMPRESSION: 1. Perforation in the left lower quadrant related to diverticulitis. 2. Secondary inflammation of left lower quadrant small bowel loops creating ileus of small bowel loops. No obstruction. 3. Thickened bladder wall, likely secondarily inflamed. 4. Coronary artery disease. 5. Small liver cyst. 6. Abdominal aortic atherosclerosis. 7. Prostatic enlargement. 8. Critical Value/emergent results were called by telephone at the time of interpretation on 06/08/2015 at 3:07 pm to Dr. Phoebe Perch , who verbally acknowledged these results.   Electronically Signed   By: Nolon Nations M.D.   On: 06/08/2015 15:07    Anti-infectives: Anti-infectives    Start     Dose/Rate Route Frequency Ordered Stop   06/03/15 1000  piperacillin-tazobactam (ZOSYN) IVPB 3.375 g     3.375 g 12.5 mL/hr over 240 Minutes Intravenous 3 times per day 06/02/15 2348     06/03/15 0026  piperacillin-tazobactam (ZOSYN) 3.375 (3-0.375) G injection    Comments:  BRUMGARD, APRIL: cabinet override      06/03/15 0026 06/03/15 0146   06/02/15 2330  piperacillin-tazobactam (ZOSYN) IVPB 3.375 g     3.375 g 100 mL/hr over 30 Minutes Intravenous  Once 06/02/15 2316 06/03/15 0215      Assessment/Plan: s/p Procedure(s): COLECTOMY WITH COLOSTOMY CREATION/HARTMANN PROCEDURE INSERTION CENTRAL LINE ADULT   Patient doing very well creatinine remains slightly elevated and he is currently on TPN. Slow improvement continue IV anabiotic's as well as PCA and TPN.Florene Glen, MD, FACS  06/10/2015

## 2015-06-11 ENCOUNTER — Encounter: Payer: Self-pay | Admitting: Surgery

## 2015-06-11 LAB — GLUCOSE, CAPILLARY
GLUCOSE-CAPILLARY: 160 mg/dL — AB (ref 65–99)
GLUCOSE-CAPILLARY: 160 mg/dL — AB (ref 65–99)
GLUCOSE-CAPILLARY: 162 mg/dL — AB (ref 65–99)
GLUCOSE-CAPILLARY: 163 mg/dL — AB (ref 65–99)
Glucose-Capillary: 171 mg/dL — ABNORMAL HIGH (ref 65–99)
Glucose-Capillary: 175 mg/dL — ABNORMAL HIGH (ref 65–99)
Glucose-Capillary: 197 mg/dL — ABNORMAL HIGH (ref 65–99)

## 2015-06-11 LAB — BASIC METABOLIC PANEL
ANION GAP: 5 (ref 5–15)
BUN: 31 mg/dL — ABNORMAL HIGH (ref 6–20)
CALCIUM: 8.9 mg/dL (ref 8.9–10.3)
CO2: 29 mmol/L (ref 22–32)
Chloride: 102 mmol/L (ref 101–111)
Creatinine, Ser: 1.4 mg/dL — ABNORMAL HIGH (ref 0.61–1.24)
GFR, EST AFRICAN AMERICAN: 50 mL/min — AB (ref >60)
GFR, EST NON AFRICAN AMERICAN: 43 mL/min — AB (ref >60)
Glucose, Bld: 156 mg/dL — ABNORMAL HIGH (ref 65–99)
POTASSIUM: 3.3 mmol/L — AB (ref 3.5–5.1)
Sodium: 136 mmol/L (ref 135–145)

## 2015-06-11 LAB — CBC WITH DIFFERENTIAL/PLATELET
BASOS PCT: 0 %
Basophils Absolute: 0.1 10*3/uL (ref 0–0.1)
EOS ABS: 0.4 10*3/uL (ref 0–0.7)
Eosinophils Relative: 2 %
HEMATOCRIT: 34.4 % — AB (ref 40.0–52.0)
HEMOGLOBIN: 11.6 g/dL — AB (ref 13.0–18.0)
LYMPHS ABS: 0.8 10*3/uL — AB (ref 1.0–3.6)
Lymphocytes Relative: 3 %
MCH: 31.9 pg (ref 26.0–34.0)
MCHC: 33.8 g/dL (ref 32.0–36.0)
MCV: 94.3 fL (ref 80.0–100.0)
Monocytes Absolute: 0.9 10*3/uL (ref 0.2–1.0)
Monocytes Relative: 4 %
NEUTROS ABS: 23 10*3/uL — AB (ref 1.4–6.5)
NEUTROS PCT: 91 %
Platelets: 245 10*3/uL (ref 150–440)
RBC: 3.65 MIL/uL — AB (ref 4.40–5.90)
RDW: 15.2 % — ABNORMAL HIGH (ref 11.5–14.5)
WBC: 25.2 10*3/uL — AB (ref 3.8–10.6)

## 2015-06-11 LAB — LACTIC ACID, PLASMA: Lactic Acid, Venous: 1.4 mmol/L (ref 0.5–2.0)

## 2015-06-11 LAB — MAGNESIUM: MAGNESIUM: 1.8 mg/dL (ref 1.7–2.4)

## 2015-06-11 LAB — PHOSPHORUS: PHOSPHORUS: 2.5 mg/dL (ref 2.5–4.6)

## 2015-06-11 MED ORDER — AMIODARONE HCL IN DEXTROSE 360-4.14 MG/200ML-% IV SOLN
30.0000 mg/h | INTRAVENOUS | Status: DC
  Administered 2015-06-11: 23.4 mg/h via INTRAVENOUS
  Administered 2015-06-12 – 2015-06-17 (×11): 30 mg/h via INTRAVENOUS
  Filled 2015-06-11 (×28): qty 200

## 2015-06-11 MED ORDER — DORZOLAMIDE HCL 2 % OP SOLN
1.0000 [drp] | Freq: Two times a day (BID) | OPHTHALMIC | Status: DC
  Administered 2015-06-11 – 2015-06-27 (×33): 1 [drp] via OPHTHALMIC

## 2015-06-11 MED ORDER — BRIMONIDINE TARTRATE 0.2 % OP SOLN
1.0000 [drp] | Freq: Two times a day (BID) | OPHTHALMIC | Status: DC
  Administered 2015-06-11 – 2015-06-27 (×32): 1 [drp] via OPHTHALMIC
  Filled 2015-06-11: qty 5

## 2015-06-11 MED ORDER — METOPROLOL TARTRATE 100 MG PO TABS: 100.0000 mg | ORAL_TABLET | Freq: Two times a day (BID) | ORAL | Status: DC

## 2015-06-11 MED ORDER — METOPROLOL TARTRATE 25 MG PO TABS
25.0000 mg | ORAL_TABLET | Freq: Two times a day (BID) | ORAL | Status: DC
  Administered 2015-06-11 – 2015-06-27 (×32): 25 mg via ORAL
  Filled 2015-06-11 (×32): qty 1

## 2015-06-11 MED ORDER — POTASSIUM CHLORIDE 20 MEQ PO PACK
20.0000 meq | PACK | Freq: Two times a day (BID) | ORAL | Status: DC
  Administered 2015-06-11 – 2015-06-12 (×4): 20 meq via ORAL
  Filled 2015-06-11 (×4): qty 1

## 2015-06-11 MED ORDER — AMIODARONE LOAD VIA INFUSION
150.0000 mg | Freq: Once | INTRAVENOUS | Status: AC
  Administered 2015-06-11: 150 mg via INTRAVENOUS
  Filled 2015-06-11: qty 83.34

## 2015-06-11 MED ORDER — TIMOLOL MALEATE 0.25 % OP SOLN
1.0000 [drp] | Freq: Two times a day (BID) | OPHTHALMIC | Status: DC
  Administered 2015-06-11 – 2015-06-27 (×33): 1 [drp] via OPHTHALMIC
  Filled 2015-06-11: qty 5

## 2015-06-11 MED ORDER — AMIODARONE HCL IN DEXTROSE 360-4.14 MG/200ML-% IV SOLN
60.0000 mg/h | INTRAVENOUS | Status: AC
  Administered 2015-06-11 (×2): 60 mg/h via INTRAVENOUS
  Filled 2015-06-11 (×2): qty 200

## 2015-06-11 MED ORDER — TRACE MINERALS CR-CU-MN-SE-ZN 10-1000-500-60 MCG/ML IV SOLN
INTRAVENOUS | Status: AC
  Administered 2015-06-11: 18:00:00 via INTRAVENOUS
  Filled 2015-06-11: qty 1800

## 2015-06-11 MED ORDER — DILTIAZEM HCL 25 MG/5ML IV SOLN
10.0000 mg | Freq: Once | INTRAVENOUS | Status: AC
  Administered 2015-06-11: 10 mg via INTRAVENOUS

## 2015-06-11 MED ORDER — DILTIAZEM HCL 100 MG IV SOLR
5.0000 mg/h | INTRAVENOUS | Status: DC
  Administered 2015-06-11: 15 mg/h via INTRAVENOUS
  Administered 2015-06-11: 10 mg/h via INTRAVENOUS
  Filled 2015-06-11 (×2): qty 100

## 2015-06-11 MED ORDER — LATANOPROST 0.005 % OP SOLN
1.0000 [drp] | Freq: Every day | OPHTHALMIC | Status: DC
  Administered 2015-06-11 – 2015-06-26 (×15): 1 [drp] via OPHTHALMIC
  Filled 2015-06-11: qty 2.5

## 2015-06-11 MED ORDER — DILTIAZEM HCL 25 MG/5ML IV SOLN
10.0000 mg | Freq: Once | INTRAVENOUS | Status: AC
  Administered 2015-06-11: 10 mg via INTRAVENOUS
  Filled 2015-06-11: qty 5

## 2015-06-11 MED ORDER — POTASSIUM CHLORIDE 10 MEQ/100ML IV SOLN
10.0000 meq | INTRAVENOUS | Status: AC
  Administered 2015-06-11 (×2): 10 meq via INTRAVENOUS
  Filled 2015-06-11 (×2): qty 100

## 2015-06-11 NOTE — Consult Note (Signed)
ANTIBIOTIC CONSULT NOTE - FOLLOW UP  Pharmacy Consult for Zosyn Indication: Diverticulitis with perforation  No Known Allergies  Patient Measurements: Height: 5\' 5"  (165.1 cm) Weight: 141 lb 1.5 oz (64 kg) IBW/kg (Calculated) : 61.5   Vital Signs: Temp: 97.7 F (36.5 C) (08/29 1230) Temp Source: Oral (08/29 1230) BP: 119/73 mmHg (08/29 1300) Pulse Rate: 93 (08/29 1300) Intake/Output from previous day: 08/28 0701 - 08/29 0700 In: 2033.7 [I.V.:268.7; NG/GT:20; IV Piggyback:405; TPN:1340] Out: 1450 [Urine:825; Emesis/NG output:625] Intake/Output from this shift: Total I/O In: 952.9 [I.V.:232.9; NG/GT:120; IV Piggyback:150; TPN:450] Out: 200 [Urine:200]  Labs:  Recent Labs  06/09/15 0539 06/10/15 0552 06/11/15 0206  WBC 15.7* 20.4* 25.2*  HGB 12.8* 12.6* 11.6*  PLT 212 263 245  CREATININE 1.49* 1.42* 1.40*   Estimated Creatinine Clearance: 31.1 mL/min (by C-G formula based on Cr of 1.4). No results for input(s): VANCOTROUGH, VANCOPEAK, VANCORANDOM, GENTTROUGH, GENTPEAK, GENTRANDOM, TOBRATROUGH, TOBRAPEAK, TOBRARND, AMIKACINPEAK, AMIKACINTROU, AMIKACIN in the last 72 hours.   Microbiology: Recent Results (from the past 720 hour(s))  MRSA PCR Screening     Status: None   Collection Time: 06/08/15 11:36 PM  Result Value Ref Range Status   MRSA by PCR NEGATIVE NEGATIVE Final    Comment:        The GeneXpert MRSA Assay (FDA approved for NASAL specimens only), is one component of a comprehensive MRSA colonization surveillance program. It is not intended to diagnose MRSA infection nor to guide or monitor treatment for MRSA infections.     Anti-infectives    Start     Dose/Rate Route Frequency Ordered Stop   06/03/15 1000  piperacillin-tazobactam (ZOSYN) IVPB 3.375 g     3.375 g 12.5 mL/hr over 240 Minutes Intravenous 3 times per day 06/02/15 2348     06/03/15 0026  piperacillin-tazobactam (ZOSYN) 3.375 (3-0.375) G injection    Comments:  BRUMGARD, APRIL:  cabinet override      06/03/15 0026 06/03/15 0146   06/02/15 2330  piperacillin-tazobactam (ZOSYN) IVPB 3.375 g     3.375 g 100 mL/hr over 30 Minutes Intravenous  Once 06/02/15 2316 06/03/15 0215      Assessment: 81 y/o M s/p Hartmann's procedure.   Plan:  Will continue Zosyn 3.375 g EI q 8 hours.   Ulice Dash D 06/11/2015,1:08 PM

## 2015-06-11 NOTE — Progress Notes (Signed)
Patient with known history of paroxysmal atrial fibrillation currently in atrial fibrillation rapid ventricular response heart rate between 160s-170s. Received Cardizem 10 mg IV with transient improvement, once again heart rate now elevated back to the 160s. Given persistent tachycardia will transfer to stepdown unit, rebolus Cardizem, Cardizem drip with goal heart rate less than 120

## 2015-06-11 NOTE — Progress Notes (Signed)
MD notified of elevated HR and a-fib RVR on telemetry. Other VSS. Pt is A&O. No c/o chest pains, SOB, or dizziness. Currently resting in bed. Will transfer to stepdown unit.

## 2015-06-11 NOTE — Care Management Important Message (Signed)
Important Message  Patient Details  Name: Jesse Macias MRN: [id number] Date of Birth: 1924/05/11   Medicare Important Message Given:  Yes-second notification given    Darius Bump Allmond 06/11/2015, 2:54 PM

## 2015-06-11 NOTE — Progress Notes (Signed)
Dr. Clayborn Bigness present and gave order to discontinue cardizem drip and losartan. MD also gave order to change metoprolol to 25mg  PO BID with hold parameters.

## 2015-06-11 NOTE — Progress Notes (Signed)
Nutrition Follow-up       INTERVENTION:  PN: continue TPN at current goal rate of 37ml/hr. Planning to run lipids in am   NUTRITION DIAGNOSIS:   Inadequate oral intake related to acute illness, altered GI function as evidenced by  (NPO/CL since admission).    GOAL:   Patient will meet greater than or equal to 90% of their needs    MONITOR:    (Energy Intake, Digestive System, Electrolyte/Renal Profile, Anthropometrics)  REASON FOR ASSESSMENT:   Consult New TPN/TNA  ASSESSMENT:      Pt transferred to ICU for rapid afib   Current Nutrition: NPO   Gastrointestinal Profile:NG tube in place 676ml output last 24 hr Last BM: no stool in ostomy   Medications: reviewed  Electrolyte/Renal Profile and Glucose Profile:   Recent Labs Lab 06/09/15 0539 06/10/15 0552 06/11/15 0206  NA 136 136 136  K 3.6 3.5 3.3*  CL 100* 101 102  CO2 25 28 29   BUN 24* 28* 31*  CREATININE 1.49* 1.42* 1.40*  CALCIUM 9.4 8.9 8.9  MG 1.7 1.5* 1.8  PHOS 3.1 2.3* 2.5  GLUCOSE 88 199* 156*   Protein Profile:  Recent Labs Lab 06/09/15 0539 06/10/15 0552  ALBUMIN 2.6* 2.4*      Weight Trend since Admission: Filed Weights   06/10/15 0419 06/10/15 2300 06/11/15 0500  Weight: 138 lb 1.6 oz (62.642 kg) 128 lb 14.4 oz (58.469 kg) 141 lb 1.5 oz (64 kg)      Diet Order:  Diet NPO time specified Except for: Sips with Meds .TPN (CLINIMIX-E) Adult .TPN (CLINIMIX-E) Adult  Skin:  Reviewed, no issues    Height:   Ht Readings from Last 1 Encounters:  06/03/15 5\' 5"  (1.651 m)    Weight:   Wt Readings from Last 1 Encounters:  06/11/15 141 lb 1.5 oz (64 kg)        BMI:  Body mass index is 23.48 kg/(m^2).  Estimated Nutritional Needs:   Kcal:  1709-2019 kcals (1195, 1.3 AF, 1.1-1.3 IF)   Protein:  67-85 g (1.1-1.4 g/kg)   Fluid:  1525-1830 mL (25-30 ml/kg)   EDUCATION NEEDS:   No education needs identified at this time  [place]. Zenia Resides, [place],  [place] (pager)

## 2015-06-11 NOTE — Progress Notes (Signed)
Surgery Progress Note  S: No acute issues.  Transferred to ICU for rapid a fib O:Blood pressure 99/65, pulse 92, temperature 98.4 F (36.9 C), temperature source Oral, resp. rate 27, height 5\' 5"  (1.651 m), weight 141 lb 1.5 oz (64 kg), SpO2 97 %. GEN: NAD/A&Ox3 ABD: Soft, min tender, nondistended, ostomy pink, no air or stool in bag  A/P 81 yo s/p hartmanns, doing well - appreciate IM management of afib - NG for now - TPN

## 2015-06-11 NOTE — Progress Notes (Signed)
RN made Dr. Margaretmary Eddy aware that patient's heart rate remains in 140's in afib on cardizem drip. MD gave order on phone for amio drip and amio bolus and cardiology consult.

## 2015-06-11 NOTE — Progress Notes (Signed)
md notified of pt elevated hr.

## 2015-06-11 NOTE — Progress Notes (Signed)
Alert to self and place and follows commands. Denies pain. PCA pump in use. VSS. Afebrile. NSR per cardiac monitor on amiodarone drip. 400cc UOP. Family at bedside throughout shift.

## 2015-06-11 NOTE — Progress Notes (Signed)
PARENTERAL NUTRITION CONSULT NOTE - INITIAL  Pharmacy Consult for Electrolyte/Glucose Management Indication: TPN  No Known Allergies  Patient Measurements: Height: 5\' 5"  (165.1 cm) Weight: 128 lb 14.4 oz (58.469 kg) IBW/kg (Calculated) : 61.5 Adjusted Body Weight: na  Usual Weight: na  Vital Signs: Temp: 98.5 F (36.9 C) (08/29 0300) Temp Source: Oral (08/29 0300) BP: 110/74 mmHg (08/29 0400) Pulse Rate: 155 (08/29 0400) Intake/Output from previous day: 08/28 0701 - 08/29 0700 In: 1523.7 [I.V.:208.7; NG/GT:20; IV Piggyback:255; TPN:1040] Out: 1325 [Urine:700; Emesis/NG output:625] Intake/Output from this shift: Total I/O In: 758.7 [I.V.:106.7; NG/GT:20; TPN:632] Out: 575 [Urine:400; Emesis/NG output:175]  Labs:  Recent Labs  06/09/15 0539 06/10/15 0552 06/11/15 0206  WBC 15.7* 20.4* 25.2*  HGB 12.8* 12.6* 11.6*  HCT 38.0* 37.1* 34.4*  PLT 212 263 245     Recent Labs  06/09/15 0539 06/10/15 0548 06/10/15 0552 06/11/15 0206  NA 136  --  136 136  K 3.6  --  3.5 3.3*  CL 100*  --  101 102  CO2 25  --  28 29  GLUCOSE 88  --  199* 156*  BUN 24*  --  28* 31*  CREATININE 1.49*  --  1.42* 1.40*  CALCIUM 9.4  --  8.9 8.9  MG 1.7  --  1.5* 1.8  PHOS 3.1  --  2.3* 2.5  PROT 5.5*  --  5.1*  --   ALBUMIN 2.6*  --  2.4*  --   AST 16  --  23  --   ALT 12*  --  14*  --   ALKPHOS 46  --  46  --   BILITOT 1.2  --  1.5*  --   TRIG  --  119  --   --    Estimated Creatinine Clearance: 29.6 mL/min (by C-G formula based on Cr of 1.4).    Recent Labs  06/10/15 2039 06/11/15 0027 06/11/15 0301  GLUCAP 162* 162* 160*    Medical History: Past Medical History  Diagnosis Date  . Hypertension   . History of hiatal hernia     Medications:  Infusions:  . Marland KitchenTPN (CLINIMIX-E) Adult 75 mL/hr at 06/10/15 1729  . diltiazem (CARDIZEM) infusion 10 mg/hr (06/11/15 0315)  . lactated ringers 10 mL/hr at 06/07/15 1432    Insulin Requirements in the past 24 hours:   Novolog 2 units, FSBS less than 154  Current Nutrition:  Patient to start Clinimix E 5/20% at 57ml/hr  Assessment: Electrolytes currently within normal limits.  Plan:  Will follow up on am labs.  8/28 AM electrolyte panel. Mg 1.5 and PO4 2.3. Magnesium sulfate 2 grams IV once and sodium phosphate 15 mmol IV once ordered. Recheck in AM.  8/29 AM electrolytes WNL except K+ 3.3. KCl 10 mEq x 2 doses ordered. Recheck in AM.  Sim Boast, PharmD, BCPS  06/11/2015

## 2015-06-11 NOTE — Progress Notes (Signed)
[place] at [place] NAME: Jesse Macias    MR#:  [id number]  DATE OF BIRTH:  May 31, 1924  SUBJECTIVE:  CHIEF COMPLAINT:   Chief Complaint  Patient presents with  . Abdominal Pain    Pt c/o pain across lower abd since 2 pm today. States pain has become severe. Pain is more severe in area of previous hernia repair. +n/v. Denies diarrhea.   patient was seen and examined at bedside, got transferred to ICU last night because of A. fib with RVR. Marland Kitchenlethargic from Dilaudid PCA. Son at bedside REVIEW OF SYSTEMS:  Review of Systems  Unable to perform ROS  DRUG ALLERGIES:  No Known Allergies VITALS:  Blood pressure 119/73, pulse 93, temperature 97.7 F (36.5 C), temperature source Oral, resp. rate 30, height 5\' 5"  (1.651 m), weight 64 kg (141 lb 1.5 oz), SpO2 98 %. PHYSICAL EXAMINATION:  Physical Exam  HENT:  Head: Normocephalic and atraumatic.  Eyes: Conjunctivae are normal. Pupils are equal, round, and reactive to light.  Neck: Neck supple. No tracheal deviation present. No thyromegaly present.  Cardiovascular: Normal rate, regular rhythm and normal heart sounds.   Pulmonary/Chest: Effort normal and breath sounds normal. No respiratory distress. He has no wheezes. He exhibits no tenderness.  Abdominal: Soft. Bowel sounds are normal. He exhibits no distension. There is tenderness.  Status post colectomy and colostomy, colostomy bag with serosanguineous fluid  Neurological: No cranial nerve deficit.  Lethargic, arousable   Skin: Skin is warm and dry. No rash noted.   LABORATORY PANEL:   CBC  Recent Labs Lab 06/11/15 0206  WBC 25.2*  HGB 11.6*  HCT 34.4*  PLT 245   ------------------------------------------------------------------------------------------------------------------ Chemistries   Recent Labs Lab 06/10/15 0552 06/11/15 0206  NA 136 136  K 3.5 3.3*  CL 101 102  CO2 28 29  GLUCOSE 199* 156*  BUN 28* 31*   CREATININE 1.42* 1.40*  CALCIUM 8.9 8.9  MG 1.5* 1.8  AST 23  --   ALT 14*  --   ALKPHOS 46  --   BILITOT 1.5*  --    RADIOLOGY:  No results found. ASSESSMENT AND PLAN:  * Uncontrolled HTN:  Probably from anxiety and pain Better today with Dilaudid PCA and anxiolytics when necessary  continue metoprolol to 50 mg QID,  hydralazine, HCTZ and losartan. Adjust as need, also on prn iv hydralazine for SBP > 160, lasix 20 mg  Bid * Acute on chronic a. Fib: Didn't respond well with Cardizem drip. Patient is started on amiodarone drip after a bolus. Cardiology consult is placed to Dr. Clayborn Bigness. Held eliquis  for surgery. Continue metoprolol for rate control. Will consider to resume Eliquis if okay with surgery  * ARF: improving with IVF, monitor. Check BMP in a.m.  * Pain with urination: UA not impressive for infection, foley helped symptoms.  * Hypokalemia: repleted and resolved, check BMP in a.m.  * SOB:  lasix 20 mg IV bid, on HCTZ (very low dose),    * diverticulitis: repeat CT this afternoon shows Perforation in the left lower quadrant likely related to diverticulitis. Patient is status post colectomy and colostomy today-post op day #2  status post central line placement Continue TPN and NG tube   All the records are reviewed and case discussed with Care Management/Social Worker. Management plans discussed with the patient, family and they are in agreement.  CODE STATUS: Full Code  TOTAL CRITICAL CARE TIME TAKING CARE OF THIS  PATIENT: 36minutes.   More than 50% of the time was spent in counseling/coordination of care: YES (d/w son and RN )   Nicholes Mango M.D on 06/11/2015 at 1:50 PM  Between 7am to 6pm - Pager - [phone]  After 6pm go to [email or web address] - password [email or web address] [facility]  [facility]  Office  [phone]  CC:  Primary care physician; No primary care provider on file.

## 2015-06-12 ENCOUNTER — Inpatient Hospital Stay: Payer: Medicare Other

## 2015-06-12 LAB — BASIC METABOLIC PANEL
Anion gap: 7 (ref 5–15)
Anion gap: 8 (ref 5–15)
BUN: 37 mg/dL — AB (ref 6–20)
BUN: 38 mg/dL — ABNORMAL HIGH (ref 6–20)
CALCIUM: 8.9 mg/dL (ref 8.9–10.3)
CALCIUM: 8.9 mg/dL (ref 8.9–10.3)
CHLORIDE: 98 mmol/L — AB (ref 101–111)
CO2: 28 mmol/L (ref 22–32)
CO2: 26 mmol/L (ref 22–32)
CREATININE: 1.33 mg/dL — AB (ref 0.61–1.24)
CREATININE: 1.4 mg/dL — AB (ref 0.61–1.24)
Chloride: 100 mmol/L — ABNORMAL LOW (ref 101–111)
GFR calc Af Amer: 53 mL/min — ABNORMAL LOW (ref >60)
GFR calc Af Amer: 50 mL/min — ABNORMAL LOW (ref >60)
GFR calc non Af Amer: 43 mL/min — ABNORMAL LOW (ref >60)
GFR, EST NON AFRICAN AMERICAN: 46 mL/min — AB (ref >60)
GLUCOSE: 170 mg/dL — AB (ref 65–99)
GLUCOSE: 129 mg/dL — AB (ref 65–99)
Potassium: 3.3 mmol/L — ABNORMAL LOW (ref 3.5–5.1)
Potassium: 3.4 mmol/L — ABNORMAL LOW (ref 3.5–5.1)
SODIUM: 135 mmol/L (ref 135–145)
Sodium: 132 mmol/L — ABNORMAL LOW (ref 135–145)

## 2015-06-12 LAB — CBC WITH DIFFERENTIAL/PLATELET
BASOS ABS: 0.1 10*3/uL (ref 0–0.1)
BASOS PCT: 0 %
EOS ABS: 0.4 10*3/uL (ref 0–0.7)
EOS PCT: 2 %
HEMATOCRIT: 32.2 % — AB (ref 40.0–52.0)
Hemoglobin: 11 g/dL — ABNORMAL LOW (ref 13.0–18.0)
Lymphocytes Relative: 3 %
Lymphs Abs: 0.8 10*3/uL — ABNORMAL LOW (ref 1.0–3.6)
MCH: 31.9 pg (ref 26.0–34.0)
MCHC: 34.1 g/dL (ref 32.0–36.0)
MCV: 93.4 fL (ref 80.0–100.0)
MONO ABS: 1.6 10*3/uL — AB (ref 0.2–1.0)
Monocytes Relative: 6 %
NEUTROS ABS: 23.8 10*3/uL — AB (ref 1.4–6.5)
Neutrophils Relative %: 89 %
PLATELETS: 252 10*3/uL (ref 150–440)
RBC: 3.45 MIL/uL — ABNORMAL LOW (ref 4.40–5.90)
RDW: 15.2 % — AB (ref 11.5–14.5)
WBC: 26.6 10*3/uL — ABNORMAL HIGH (ref 3.8–10.6)

## 2015-06-12 LAB — GLUCOSE, CAPILLARY
GLUCOSE-CAPILLARY: 179 mg/dL — AB (ref 65–99)
GLUCOSE-CAPILLARY: 178 mg/dL — AB (ref 65–99)
GLUCOSE-CAPILLARY: 145 mg/dL — AB (ref 65–99)
Glucose-Capillary: 156 mg/dL — ABNORMAL HIGH (ref 65–99)
Glucose-Capillary: 210 mg/dL — ABNORMAL HIGH (ref 65–99)
Glucose-Capillary: 148 mg/dL — ABNORMAL HIGH (ref 65–99)

## 2015-06-12 LAB — PHOSPHORUS
PHOSPHORUS: 1.5 mg/dL — AB (ref 2.5–4.6)
Phosphorus: 2.4 mg/dL — ABNORMAL LOW (ref 2.5–4.6)

## 2015-06-12 LAB — MAGNESIUM: MAGNESIUM: 1.7 mg/dL (ref 1.7–2.4)

## 2015-06-12 MED ORDER — DEXTROSE 5 % IV SOLN
15.0000 mmol | Freq: Once | INTRAVENOUS | Status: AC
  Administered 2015-06-12: 15 mmol via INTRAVENOUS
  Filled 2015-06-12: qty 5

## 2015-06-12 MED ORDER — FAT EMULSION 20 % IV EMUL
500.0000 mL | INTRAVENOUS | Status: AC
  Administered 2015-06-12: 500 mL via INTRAVENOUS
  Filled 2015-06-12 (×3): qty 500

## 2015-06-12 MED ORDER — TRACE MINERALS CR-CU-MN-SE-ZN 10-1000-500-60 MCG/ML IV SOLN
INTRAVENOUS | Status: AC
  Administered 2015-06-12: 17:00:00 via INTRAVENOUS
  Filled 2015-06-12: qty 1800

## 2015-06-12 MED ORDER — APIXABAN 5 MG PO TABS
5.0000 mg | ORAL_TABLET | Freq: Two times a day (BID) | ORAL | Status: DC
Start: 2015-06-12 — End: 2015-06-14
  Administered 2015-06-12 – 2015-06-13 (×4): 5 mg via ORAL
  Filled 2015-06-12 (×4): qty 1

## 2015-06-12 MED ORDER — IPRATROPIUM-ALBUTEROL 0.5-2.5 (3) MG/3ML IN SOLN
3.0000 mL | RESPIRATORY_TRACT | Status: DC
  Administered 2015-06-12 – 2015-06-14 (×11): 3 mL via RESPIRATORY_TRACT
  Filled 2015-06-12 (×12): qty 3

## 2015-06-12 MED ORDER — POTASSIUM PHOSPHATES 15 MMOLE/5ML IV SOLN
15.0000 mmol | Freq: Once | INTRAVENOUS | Status: AC
  Administered 2015-06-12: 15 mmol via INTRAVENOUS
  Filled 2015-06-12: qty 5

## 2015-06-12 NOTE — Progress Notes (Signed)
Surgery Progress Note  S: No acute issues.  WBC up, HR improved. Awaiting ostomy output O:Blood pressure 148/75, pulse 104, temperature 98.5 F (36.9 C), temperature source Oral, resp. rate 29, height 5\' 5"  (1.651 m), weight 144 lb 2.9 oz (65.4 kg), SpO2 97 %. 200 ng output GEN: NAD/A&Ox3 ABD: soft, min tender, nondistended, incision c/d/i with penrose  WBC 26.6  A/P 81 yo s/p hartmanns, awaiting bowel function - ng for now - appreciate assistance with afib - okay to begin anticoagulation

## 2015-06-12 NOTE — Progress Notes (Signed)
[place] at [place] NAME: Jesse Macias    MR#:  [id number]  DATE OF BIRTH:  June 27, 1924  SUBJECTIVE:  Patient has no complaints except for minimal shortness of breath. No wheezing or rhonchi heart rates better controlled REVIEW OF SYSTEMS:    Review of Systems  Constitutional: Negative for fever, chills and malaise/fatigue.  HENT: Negative for sore throat.   Eyes: Negative for blurred vision.  Respiratory: Positive for shortness of breath. Negative for cough, hemoptysis and wheezing.   Cardiovascular: Negative for chest pain, palpitations and leg swelling.  Gastrointestinal: Positive for abdominal pain. Negative for nausea, vomiting, diarrhea and blood in stool.  Genitourinary: Negative for dysuria.  Musculoskeletal: Negative for back pain.  Neurological: Negative for dizziness, tremors and headaches.  Endo/Heme/Allergies: Does not bruise/bleed easily.    Tolerating Diet: Nothing by mouth On TPN     DRUG ALLERGIES:  No Known Allergies  VITALS:  Blood pressure 125/104, pulse 99, temperature 98.5 F (36.9 C), temperature source Oral, resp. rate 26, height 5\' 5"  (1.651 m), weight 65.4 kg (144 lb 2.9 oz), SpO2 97 %.  PHYSICAL EXAMINATION:   Physical Exam  Constitutional: He is oriented to person, place, and time and well-developed, well-nourished, and in no distress. No distress.  HENT:  Head: Normocephalic.  Eyes: No scleral icterus.  Neck: Normal range of motion. Neck supple. No JVD present. No tracheal deviation present.  Cardiovascular: Regular rhythm and normal heart sounds.  Exam reveals no gallop and no friction rub.   No murmur heard. tachy  Pulmonary/Chest: Effort normal and breath sounds normal. No respiratory distress. He has no wheezes. He has no rales. He exhibits no tenderness.  Abdominal: Soft. He exhibits no distension and no mass. There is tenderness. There is no rebound and no guarding.  JP drain    Musculoskeletal: Normal range of motion. He exhibits no edema.  Neurological: He is alert and oriented to person, place, and time.  Skin: Skin is warm. No rash noted. No erythema.  Psychiatric: Affect and judgment normal.      LABORATORY PANEL:   CBC  Recent Labs Lab 06/12/15 0423  WBC 26.6*  HGB 11.0*  HCT 32.2*  PLT 252   ------------------------------------------------------------------------------------------------------------------  Chemistries   Recent Labs Lab 06/10/15 0552  06/12/15 0423  NA 136  < > 135  K 3.5  < > 3.3*  CL 101  < > 100*  CO2 28  < > 28  GLUCOSE 199*  < > 170*  BUN 28*  < > 37*  CREATININE 1.42*  < > 1.33*  CALCIUM 8.9  < > 8.9  MG 1.5*  < > 1.7  AST 23  --   --   ALT 14*  --   --   ALKPHOS 46  --   --   BILITOT 1.5*  --   --   < > = values in this interval not displayed. ------------------------------------------------------------------------------------------------------------------  Cardiac Enzymes No results for input(s): TROPONINI in the last 168 hours. ------------------------------------------------------------------------------------------------------------------  RADIOLOGY:  No results found.   ASSESSMENT AND PLAN:   81 year old male with recurrent diverticulitis status post Hartmann procedure.  1. Acute on chronic atrophic relation: Patient went into RVR and subsequently placed on a butyrin drip. Heart rate is better controlled. He did not respond well with the cardia is in drip. Cardiology consultation has been placed. Eliquis was initially held for surgery but may be restarted as per surgery consultation. Pharmacy is  assisting with this. Patient will continue metoprolol for rate control and amiodarone for now.  2. Uncontrolled hypertension: Patient's blood pressure has much improved. Continue metoprolol, HCTZ and hydralazine. Patient is also on bilateral PCA.  3. Glaucoma,: Continue atenolol and XALATAN eyedrops  4.  Acute renal failure: Improved with IV fluids.  5. Perforated diverticulitis: Patient is status post Hartman's procedure. As per surgery.        Management plans discussed with the patient and he is in agreement.  CODE STATUS: FULL  TOTAL TIME TAKING CARE OF THIS PATIENT: 30 minutes.     POSSIBLE D/C 3-5 days, DEPENDING ON CLINICAL CONDITION.   Tayten Bergdoll M.D on 06/12/2015 at 12:04 PM  Between 7am to 6pm - Pager - [phone] After 6pm go to [email or web address] - password [email or web address] [facility]  [facility]  Office  [phone]  CC: Primary care physician; No primary care provider on file.

## 2015-06-12 NOTE — Consult Note (Signed)
Reason for Consult: atrial fibrillation Referring Physician: Dr Sheilah Pigeon is an 81 y.o. male.  HPI:  81 year old white male history of hypertension diverticulosis recently had emergency surgery for diverticulitis. Patient is been doing reasonably well postoperatively but developed atrial fibrillation. Patient was treated with diltiazem as well as metoprolol with little improvement and was subsequently switched to amiodarone for when he converted. Patient is still relatively NPO with an NG tube in place but is heart rates much improved denies any chest pain. Patient denies any blackout spells of syncope abdominal pain is improving.  Past Medical History  Diagnosis Date  . Hypertension   . History of hiatal hernia     Past Surgical History  Procedure Laterality Date  . Hernia repair      Two  . Back surgery    . Colectomy with colostomy creation/hartmann procedure N/A 06/09/2015    Procedure: COLECTOMY WITH COLOSTOMY CREATION/HARTMANN PROCEDURE;  Surgeon: Florene Glen, MD;  Location: [facility] ORS;  Service: General;  Laterality: N/A;  . Central venous catheter insertion N/A 06/09/2015    Procedure: INSERTION CENTRAL LINE ADULT;  Surgeon: Florene Glen, MD;  Location: [facility] ORS;  Service: General;  Laterality: N/A;    History reviewed. No pertinent family history.  Social History:  reports that he has never smoked. He does not have any smokeless tobacco history on file. His alcohol and drug histories are not on file.  Allergies: No Known Allergies  Medications: I have reviewed the patient's current medications.  Results for orders placed or performed during the hospital encounter of 06/02/15 (from the past 48 hour(s))  Glucose, capillary     Status: Abnormal   Collection Time: 06/10/15  4:17 PM  Result Value Ref Range   Glucose-Capillary 155 (H) 65 - 99 mg/dL   Comment 1 Notify RN   Glucose, capillary     Status: Abnormal   Collection Time: 06/10/15  4:24 PM  Result  Value Ref Range   Glucose-Capillary 148 (H) 65 - 99 mg/dL  Glucose, capillary     Status: Abnormal   Collection Time: 06/10/15  5:42 PM  Result Value Ref Range   Glucose-Capillary 143 (H) 65 - 99 mg/dL  Glucose, capillary     Status: Abnormal   Collection Time: 06/10/15  8:39 PM  Result Value Ref Range   Glucose-Capillary 162 (H) 65 - 99 mg/dL  Glucose, capillary     Status: Abnormal   Collection Time: 06/11/15 12:27 AM  Result Value Ref Range   Glucose-Capillary 162 (H) 65 - 99 mg/dL  CBC with Differential/Platelet     Status: Abnormal   Collection Time: 06/11/15  2:06 AM  Result Value Ref Range   WBC 25.2 (H) 3.8 - 10.6 K/uL   RBC 3.65 (L) 4.40 - 5.90 MIL/uL   Hemoglobin 11.6 (L) 13.0 - 18.0 g/dL   HCT 34.4 (L) 40.0 - 52.0 %   MCV 94.3 80.0 - 100.0 fL   MCH 31.9 26.0 - 34.0 pg   MCHC 33.8 32.0 - 36.0 g/dL   RDW 15.2 (H) 11.5 - 14.5 %   Platelets 245 150 - 440 K/uL   Neutrophils Relative % 91 %   Neutro Abs 23.0 (H) 1.4 - 6.5 K/uL   Lymphocytes Relative 3 %   Lymphs Abs 0.8 (L) 1.0 - 3.6 K/uL   Monocytes Relative 4 %   Monocytes Absolute 0.9 0.2 - 1.0 K/uL   Eosinophils Relative 2 %   Eosinophils Absolute 0.4 0 -  0.7 K/uL   Basophils Relative 0 %   Basophils Absolute 0.1 0 - 0.1 K/uL  Magnesium     Status: None   Collection Time: 06/11/15  2:06 AM  Result Value Ref Range   Magnesium 1.8 1.7 - 2.4 mg/dL  Phosphorus     Status: None   Collection Time: 06/11/15  2:06 AM  Result Value Ref Range   Phosphorus 2.5 2.5 - 4.6 mg/dL  Basic metabolic panel     Status: Abnormal   Collection Time: 06/11/15  2:06 AM  Result Value Ref Range   Sodium 136 135 - 145 mmol/L   Potassium 3.3 (L) 3.5 - 5.1 mmol/L   Chloride 102 101 - 111 mmol/L   CO2 29 22 - 32 mmol/L   Glucose, Bld 156 (H) 65 - 99 mg/dL   BUN 31 (H) 6 - 20 mg/dL   Creatinine, Ser 1.40 (H) 0.61 - 1.24 mg/dL   Calcium 8.9 8.9 - 10.3 mg/dL   GFR calc non Af Amer 43 (L) >60 mL/min   GFR calc Af Amer 50 (L) >60 mL/min     Comment: (NOTE) The eGFR has been calculated using the CKD EPI equation. This calculation has not been validated in all clinical situations. eGFR's persistently <60 mL/min signify possible Chronic Kidney Disease.    Anion gap 5 5 - 15  Glucose, capillary     Status: Abnormal   Collection Time: 06/11/15  3:01 AM  Result Value Ref Range   Glucose-Capillary 160 (H) 65 - 99 mg/dL  Lactic acid, plasma     Status: None   Collection Time: 06/11/15  6:30 AM  Result Value Ref Range   Lactic Acid, Venous 1.4 0.5 - 2.0 mmol/L  Glucose, capillary     Status: Abnormal   Collection Time: 06/11/15  7:47 AM  Result Value Ref Range   Glucose-Capillary 171 (H) 65 - 99 mg/dL  Glucose, capillary     Status: Abnormal   Collection Time: 06/11/15 11:26 AM  Result Value Ref Range   Glucose-Capillary 197 (H) 65 - 99 mg/dL  Glucose, capillary     Status: Abnormal   Collection Time: 06/11/15  4:36 PM  Result Value Ref Range   Glucose-Capillary 163 (H) 65 - 99 mg/dL  Glucose, capillary     Status: Abnormal   Collection Time: 06/11/15  8:29 PM  Result Value Ref Range   Glucose-Capillary 160 (H) 65 - 99 mg/dL  Glucose, capillary     Status: Abnormal   Collection Time: 06/11/15 11:42 PM  Result Value Ref Range   Glucose-Capillary 175 (H) 65 - 99 mg/dL  Glucose, capillary     Status: Abnormal   Collection Time: 06/12/15  3:56 AM  Result Value Ref Range   Glucose-Capillary 179 (H) 65 - 99 mg/dL  CBC with Differential/Platelet     Status: Abnormal   Collection Time: 06/12/15  4:23 AM  Result Value Ref Range   WBC 26.6 (H) 3.8 - 10.6 K/uL   RBC 3.45 (L) 4.40 - 5.90 MIL/uL   Hemoglobin 11.0 (L) 13.0 - 18.0 g/dL   HCT 32.2 (L) 40.0 - 52.0 %   MCV 93.4 80.0 - 100.0 fL   MCH 31.9 26.0 - 34.0 pg   MCHC 34.1 32.0 - 36.0 g/dL   RDW 15.2 (H) 11.5 - 14.5 %   Platelets 252 150 - 440 K/uL   Neutrophils Relative % 89 %   Neutro Abs 23.8 (H) 1.4 - 6.5 K/uL  Lymphocytes Relative 3 %   Lymphs Abs 0.8 (L)  1.0 - 3.6 K/uL   Monocytes Relative 6 %   Monocytes Absolute 1.6 (H) 0.2 - 1.0 K/uL   Eosinophils Relative 2 %   Eosinophils Absolute 0.4 0 - 0.7 K/uL   Basophils Relative 0 %   Basophils Absolute 0.1 0 - 0.1 K/uL  Magnesium     Status: None   Collection Time: 06/12/15  4:23 AM  Result Value Ref Range   Magnesium 1.7 1.7 - 2.4 mg/dL  Phosphorus     Status: Abnormal   Collection Time: 06/12/15  4:23 AM  Result Value Ref Range   Phosphorus 1.5 (L) 2.5 - 4.6 mg/dL  Basic metabolic panel     Status: Abnormal   Collection Time: 06/12/15  4:23 AM  Result Value Ref Range   Sodium 135 135 - 145 mmol/L   Potassium 3.3 (L) 3.5 - 5.1 mmol/L   Chloride 100 (L) 101 - 111 mmol/L   CO2 28 22 - 32 mmol/L   Glucose, Bld 170 (H) 65 - 99 mg/dL   BUN 37 (H) 6 - 20 mg/dL   Creatinine, Ser 1.33 (H) 0.61 - 1.24 mg/dL   Calcium 8.9 8.9 - 10.3 mg/dL   GFR calc non Af Amer 46 (L) >60 mL/min   GFR calc Af Amer 53 (L) >60 mL/min    Comment: (NOTE) The eGFR has been calculated using the CKD EPI equation. This calculation has not been validated in all clinical situations. eGFR's persistently <60 mL/min signify possible Chronic Kidney Disease.    Anion gap 7 5 - 15  Glucose, capillary     Status: Abnormal   Collection Time: 06/12/15  5:38 AM  Result Value Ref Range   Glucose-Capillary 156 (H) 65 - 99 mg/dL  Glucose, capillary     Status: Abnormal   Collection Time: 06/12/15  7:35 AM  Result Value Ref Range   Glucose-Capillary 178 (H) 65 - 99 mg/dL  Glucose, capillary     Status: Abnormal   Collection Time: 06/12/15 12:01 PM  Result Value Ref Range   Glucose-Capillary 210 (H) 65 - 99 mg/dL    No results found.  Review of Systems  Constitutional: Positive for weight loss and malaise/fatigue.  HENT: Negative.   Respiratory: Negative.   Cardiovascular: Negative.   Gastrointestinal: Positive for nausea and abdominal pain.  Genitourinary: Negative.   Musculoskeletal: Negative.   Skin: Negative.    Neurological: Positive for weakness.  Endo/Heme/Allergies: Negative.   Psychiatric/Behavioral: Negative.    Blood pressure 125/104, pulse 99, temperature 98.5 F (36.9 C), temperature source Oral, resp. rate 26, height 5' 5"  (1.651 m), weight 65.4 kg (144 lb 2.9 oz), SpO2 97 %. Physical Exam  Constitutional: He is oriented to person, place, and time. He appears well-developed and well-nourished.  HENT:  Head: Normocephalic and atraumatic.  Eyes: Conjunctivae and EOM are normal. Pupils are equal, round, and reactive to light.  Neck: Normal range of motion. Neck supple.  Cardiovascular: Normal rate, regular rhythm and normal heart sounds.   Respiratory: Effort normal and breath sounds normal.  GI: He exhibits distension. There is tenderness.  Musculoskeletal: Normal range of motion.  Neurological: He is alert and oriented to person, place, and time. He has normal reflexes.  Skin: Skin is warm.  Psychiatric: He has a normal mood and affect. His behavior is normal.    Assessment/Plan:  atrial fibrillation  postop for  Diverticulitis  hypertension  chronic renal insufficiency  tachycardia  COPD . PLAN  agree with IV amiodarone and switch to p.o.  we need diltiazem  continue low-dose metoprolol for heart rate control  continue postoperative care for diverticulitis surgery  hypertension control with hydralazine metoprolol by  IV insulin therapy 4 hyper glycemia  eyedrops for glaucoma  consider echocardiogram and because of atrial fibrillation  recommend conservative therapy at this point Richel Millspaugh D. 06/12/2015, 12:18 PM

## 2015-06-12 NOTE — Progress Notes (Signed)
PARENTERAL NUTRITION CONSULT NOTE - INITIAL  Pharmacy Consult for Electrolyte/Glucose Management Indication: TPN  No Known Allergies  Patient Measurements: Height: 5\' 5"  (165.1 cm) Weight: 144 lb 2.9 oz (65.4 kg) IBW/kg (Calculated) : 61.5 Adjusted Body Weight: na  Usual Weight: na  Vital Signs: Temp: 98.8 F (37.1 C) (08/30 1600) Temp Source: Axillary (08/30 1600) BP: 122/81 mmHg (08/30 1800) Pulse Rate: 91 (08/30 1800) Intake/Output from previous day: 08/29 0701 - 08/30 0700 In: 3205.4 [P.O.:60; I.V.:500.4; NG/GT:120; IV Piggyback:200; TPN:2325] Out: 1470 [Urine:970; Emesis/NG output:200; Drains:250; Stool:50] Intake/Output from this shift: Total I/O In: 1706 [I.V.:251; NG/GT:200; IV Piggyback:305; TPN:950] Out: 525 [Urine:525]  Labs:  Recent Labs  06/10/15 0552 06/11/15 0206 06/12/15 0423  WBC 20.4* 25.2* 26.6*  HGB 12.6* 11.6* 11.0*  HCT 37.1* 34.4* 32.2*  PLT 263 245 252     Recent Labs  06/10/15 0548  06/10/15 0552 06/11/15 0206 06/12/15 0423 06/12/15 1647  NA  --   < > 136 136 135 132*  K  --   < > 3.5 3.3* 3.3* 3.4*  CL  --   < > 101 102 100* 98*  CO2  --   < > 28 29 28 26   GLUCOSE  --   < > 199* 156* 170* 129*  BUN  --   < > 28* 31* 37* 38*  CREATININE  --   < > 1.42* 1.40* 1.33* 1.40*  CALCIUM  --   < > 8.9 8.9 8.9 8.9  MG  --   --  1.5* 1.8 1.7  --   PHOS  --   < > 2.3* 2.5 1.5* 2.4*  PROT  --   --  5.1*  --   --   --   ALBUMIN  --   --  2.4*  --   --   --   AST  --   --  23  --   --   --   ALT  --   --  14*  --   --   --   ALKPHOS  --   --  46  --   --   --   BILITOT  --   --  1.5*  --   --   --   TRIG 119  --   --   --   --   --   < > = values in this interval not displayed. Estimated Creatinine Clearance: 31.1 mL/min (by C-G formula based on Cr of 1.4).    Recent Labs  06/12/15 0735 06/12/15 1201 06/12/15 1553  GLUCAP 178* 210* 148*    Medical History: Past Medical History  Diagnosis Date  . Hypertension   . History of  hiatal hernia     Medications:  Infusions:  . amiodarone 30 mg/hr (06/12/15 1326)  . Marland KitchenTPN (CLINIMIX-E) Adult 75 mL/hr at 06/12/15 1721   And  . fat emulsion 500 mL (06/12/15 1630)  . lactated ringers 10 mL/hr at 06/12/15 1800    Assessment: K and remain low after Kphos 15 mmol this am x 1.   Plan:  Will order an additional Kphos 15 mmol x 1 and f/u am labs.   Ulice Dash, PharmD Clinical Pharmacist  06/12/2015

## 2015-06-12 NOTE — Progress Notes (Signed)
PARENTERAL NUTRITION CONSULT NOTE - INITIAL  Pharmacy Consult for Electrolyte/Glucose Management Indication: TPN  No Known Allergies  Patient Measurements: Height: 5\' 5"  (165.1 cm) Weight: 144 lb 2.9 oz (65.4 kg) IBW/kg (Calculated) : 61.5 Adjusted Body Weight: na  Usual Weight: na  Vital Signs: Temp: 98.6 F (37 C) (08/30 0352) Temp Source: Oral (08/30 0352) BP: 143/64 mmHg (08/30 0604) Pulse Rate: 96 (08/30 0604) Intake/Output from previous day: 08/29 0701 - 08/30 0700 In: 3130.4 [P.O.:60; I.V.:500.4; NG/GT:120; IV Piggyback:200; TPN:2250] Out: 1470 [Urine:970; Emesis/NG output:200; Drains:250; Stool:50] Intake/Output from this shift: Total I/O In: [place] [P.O.:60; IV Piggyback:50; TPN:1425] Out: 870 [Urine:570; Drains:250; Stool:50]  Labs:  Recent Labs  06/10/15 0552 06/11/15 0206 06/12/15 0423  WBC 20.4* 25.2* 26.6*  HGB 12.6* 11.6* 11.0*  HCT 37.1* 34.4* 32.2*  PLT 263 245 252     Recent Labs  06/10/15 0548 06/10/15 0552 06/11/15 0206 06/12/15 0423  NA  --  136 136 135  K  --  3.5 3.3* 3.3*  CL  --  101 102 100*  CO2  --  28 29 28   GLUCOSE  --  199* 156* 170*  BUN  --  28* 31* 37*  CREATININE  --  1.42* 1.40* 1.33*  CALCIUM  --  8.9 8.9 8.9  MG  --  1.5* 1.8 1.7  PHOS  --  2.3* 2.5 1.5*  PROT  --  5.1*  --   --   ALBUMIN  --  2.4*  --   --   AST  --  23  --   --   ALT  --  14*  --   --   ALKPHOS  --  46  --   --   BILITOT  --  1.5*  --   --   TRIG 119  --   --   --    Estimated Creatinine Clearance: 32.8 mL/min (by C-G formula based on Cr of 1.33).    Recent Labs  06/11/15 2342 06/12/15 0356 06/12/15 0538  GLUCAP 175* 179* 156*    Medical History: Past Medical History  Diagnosis Date  . Hypertension   . History of hiatal hernia     Medications:  Infusions:  . Marland KitchenTPN (CLINIMIX-E) Adult 75 mL/hr at 06/12/15 0600  . amiodarone 16.2 mg/hr (06/12/15 0600)  . lactated ringers Stopped (06/12/15 0403)    Insulin Requirements in the  past 24 hours:  Novolog 2 units, FSBS less than 154  Current Nutrition:  Patient to start Clinimix E 5/20% at 49ml/hr  Assessment: Electrolytes currently within normal limits.  Plan:  Will follow up on am labs.  8/28 AM electrolyte panel. Mg 1.5 and PO4 2.3. Magnesium sulfate 2 grams IV once and sodium phosphate 15 mmol IV once ordered. Recheck in AM.  8/29 AM electrolytes WNL except K+ 3.3. KCl 10 mEq x 2 doses ordered. Recheck in AM.  0830 AM: K 3.3, phos 1.5, ordered potassium phosphate 15 mmol IV x 1 and will recheck labs this afternoon.  Darris Staiger A. [place], [place].D.  Clinical Pharmacist  06/12/2015

## 2015-06-12 NOTE — Progress Notes (Signed)
A&O to person and place. Confused to situation at times. Denies pain. VSS. NSR-Stach per cardiac montior on amiodarone drip. Afebrile. Dressing to abdomen intact with no drainage. Family visited throughout shift.

## 2015-06-12 NOTE — Care Management Note (Signed)
Case Management Note  Patient Details  Name: Jesse Macias MRN: [id number] Date of Birth: [birth year]  Subjective/Objective:   Transfer from 2 C due to Afib with RVR.  S/P Hartmanns 8/27 secondary to diverticular perforation. . Currently on a Amnioderone gtt. Following.                Action/Plan:   Expected Discharge Date:                  Expected Discharge Plan:     In-House Referral:     Discharge planning Services     Post Acute Care Choice:    Choice offered to:     DME Arranged:    DME Agency:     HH Arranged:    HH Agency:     Status of Service:  In process, will continue to follow  Medicare Important Message Given:  Yes-second notification given Date Medicare IM Given:    Medicare IM give by:    Date Additional Medicare IM Given:    Additional Medicare Important Message give by:     If discussed at [place] of Stay Meetings, dates discussed:    Additional Comments:  Jolly Mango, RN 06/12/2015, 1:31 PM

## 2015-06-12 NOTE — Progress Notes (Signed)
MEDICATION RELATED CONSULT NOTE - FOLLOW UP   Pharmacy Consult for Apixaban (Eliquis) Indication: Apixaban for Afib  No Known Allergies  Patient Measurements: Height: 5\' 5"  (165.1 cm) Weight: 144 lb 2.9 oz (65.4 kg) IBW/kg (Calculated) : 61.5 Adjusted Body Weight:  Vital Signs: Temp: 98.1 F (36.7 C) (08/30 1235) Temp Source: Oral (08/30 1235) BP: 125/104 mmHg (08/30 1200) Pulse Rate: 99 (08/30 1200)  Labs:  Recent Labs  06/10/15 0552 06/11/15 0206 06/12/15 0423  WBC 20.4* 25.2* 26.6*  HGB 12.6* 11.6* 11.0*  HCT 37.1* 34.4* 32.2*  PLT 263 245 252  CREATININE 1.42* 1.40* 1.33*  MG 1.5* 1.8 1.7  PHOS 2.3* 2.5 1.5*  ALBUMIN 2.4*  --   --   PROT 5.1*  --   --   AST 23  --   --   ALT 14*  --   --   ALKPHOS 46  --   --   BILITOT 1.5*  --   --    Estimated Creatinine Clearance: 32.8 mL/min (by C-G formula based on Cr of 1.33).   Microbiology: Recent Results (from the past 720 hour(s))  MRSA PCR Screening     Status: None   Collection Time: 06/08/15 11:36 PM  Result Value Ref Range Status   MRSA by PCR NEGATIVE NEGATIVE Final    Comment:        The GeneXpert MRSA Assay (FDA approved for NASAL specimens only), is one component of a comprehensive MRSA colonization surveillance program. It is not intended to diagnose MRSA infection nor to guide or monitor treatment for MRSA infections.     Medications:  Scheduled:  . apixaban  5 mg Oral BID  . brimonidine  1 drop Both Eyes BID  . dorzolamide  1 drop Both Eyes BID  . hydrALAZINE  25 mg Oral 3 times per day  . hydrochlorothiazide  12.5 mg Oral Daily  . HYDROmorphone PCA 0.3 mg/mL   Intravenous 6 times per day  . hypromellose   Both Eyes QHS  . insulin aspart  0-24 Units Subcutaneous 6 times per day  . ipratropium-albuterol  3 mL Nebulization Q4H  . latanoprost  1 drop Both Eyes QHS  . metoprolol tartrate  25 mg Oral BID  . piperacillin-tazobactam (ZOSYN)  IV  3.375 g Intravenous 3 times per day  .  potassium chloride  20 mEq Oral BID  . potassium phosphate IVPB (mmol)  15 mmol Intravenous Once  . timolol  1 drop Both Eyes BID   PRN: [DISCONTINUED] acetaminophen **OR** acetaminophen, diphenhydrAMINE **OR** diphenhydrAMINE, hydrALAZINE, LORazepam, menthol-cetylpyridinium, metoprolol, naloxone **AND** sodium chloride, ondansetron **OR** ondansetron (ZOFRAN) IV  Assessment: Apixaban: Patient previously on Apixaban 2.5 mg po BID chronically for anticoagulation due to atrial fibrillation. Medication was withheld upon admission due to surgery.  Per Surgery note today , 06/12/15, OK to begin anticoagulation  Plan:  Apixaban: Will start apixaban 5 mg PO BID (note: patient's age >24yo but Wt> 60 kg and Scr < 1.5.  Will continue to monitor CBC and Scr during admission.  Pharmacy will continue to follow.  Chinita Greenland PharmD Clinical Pharmacist 06/12/2015 12:57 PM

## 2015-06-13 LAB — CBC WITH DIFFERENTIAL/PLATELET
Basophils Absolute: 0.2 10*3/uL — ABNORMAL HIGH (ref 0–0.1)
Basophils Relative: 1 %
EOS ABS: 0.5 10*3/uL (ref 0–0.7)
EOS PCT: 2 %
HCT: 33.1 % — ABNORMAL LOW (ref 40.0–52.0)
Hemoglobin: 11.4 g/dL — ABNORMAL LOW (ref 13.0–18.0)
LYMPHS ABS: 1 10*3/uL (ref 1.0–3.6)
Lymphocytes Relative: 4 %
MCH: 32.5 pg (ref 26.0–34.0)
MCHC: 34.5 g/dL (ref 32.0–36.0)
MCV: 94.3 fL (ref 80.0–100.0)
MONO ABS: 2 10*3/uL — AB (ref 0.2–1.0)
Monocytes Relative: 8 %
NEUTROS PCT: 85 %
Neutro Abs: 21 10*3/uL — ABNORMAL HIGH (ref 1.4–6.5)
PLATELETS: 256 10*3/uL (ref 150–440)
RBC: 3.51 MIL/uL — AB (ref 4.40–5.90)
RDW: 15.6 % — AB (ref 11.5–14.5)
WBC: 24.7 10*3/uL — AB (ref 3.8–10.6)

## 2015-06-13 LAB — GLUCOSE, CAPILLARY
GLUCOSE-CAPILLARY: 150 mg/dL — AB (ref 65–99)
GLUCOSE-CAPILLARY: 157 mg/dL — AB (ref 65–99)
GLUCOSE-CAPILLARY: 161 mg/dL — AB (ref 65–99)
GLUCOSE-CAPILLARY: 172 mg/dL — AB (ref 65–99)
GLUCOSE-CAPILLARY: 182 mg/dL — AB (ref 65–99)
Glucose-Capillary: 139 mg/dL — ABNORMAL HIGH (ref 65–99)

## 2015-06-13 LAB — BASIC METABOLIC PANEL
ANION GAP: 5 (ref 5–15)
Anion gap: 7 (ref 5–15)
BUN: 37 mg/dL — AB (ref 6–20)
BUN: 33 mg/dL — AB (ref 6–20)
CALCIUM: 8.6 mg/dL — AB (ref 8.9–10.3)
CALCIUM: 6.8 mg/dL — AB (ref 8.9–10.3)
CHLORIDE: 97 mmol/L — AB (ref 101–111)
CO2: 28 mmol/L (ref 22–32)
CO2: 21 mmol/L — ABNORMAL LOW (ref 22–32)
CREATININE: 1.47 mg/dL — AB (ref 0.61–1.24)
Chloride: 109 mmol/L (ref 101–111)
Creatinine, Ser: 1.1 mg/dL (ref 0.61–1.24)
GFR calc Af Amer: 47 mL/min — ABNORMAL LOW (ref >60)
GFR calc Af Amer: >60 mL/min (ref >60)
GFR calc non Af Amer: 40 mL/min — ABNORMAL LOW (ref >60)
GFR calc non Af Amer: 57 mL/min — ABNORMAL LOW (ref >60)
Glucose, Bld: 144 mg/dL — ABNORMAL HIGH (ref 65–99)
Glucose, Bld: 106 mg/dL — ABNORMAL HIGH (ref 65–99)
Potassium: 3.4 mmol/L — ABNORMAL LOW (ref 3.5–5.1)
Potassium: 2.8 mmol/L — CL (ref 3.5–5.1)
SODIUM: 132 mmol/L — AB (ref 135–145)
SODIUM: 135 mmol/L (ref 135–145)

## 2015-06-13 LAB — MAGNESIUM: MAGNESIUM: 1.5 mg/dL — AB (ref 1.7–2.4)

## 2015-06-13 LAB — SURGICAL PATHOLOGY

## 2015-06-13 LAB — PHOSPHORUS: Phosphorus: 3.3 mg/dL (ref 2.5–4.6)

## 2015-06-13 MED ORDER — POTASSIUM CHLORIDE 10 MEQ/50ML IV SOLN
10.0000 meq | INTRAVENOUS | Status: AC
  Administered 2015-06-13 (×6): 10 meq via INTRAVENOUS
  Filled 2015-06-13 (×6): qty 50

## 2015-06-13 MED ORDER — TRACE MINERALS CR-CU-MN-SE-ZN 10-1000-500-60 MCG/ML IV SOLN
INTRAVENOUS | Status: AC
  Administered 2015-06-13: 18:00:00 via INTRAVENOUS
  Filled 2015-06-13: qty 1800

## 2015-06-13 MED ORDER — MAGNESIUM SULFATE 2 GM/50ML IV SOLN
2.0000 g | Freq: Once | INTRAVENOUS | Status: AC
  Administered 2015-06-13: 2 g via INTRAVENOUS
  Filled 2015-06-13: qty 50

## 2015-06-13 MED ORDER — POTASSIUM CHLORIDE 10 MEQ/100ML IV SOLN
10.0000 meq | INTRAVENOUS | Status: AC
  Administered 2015-06-13 (×4): 10 meq via INTRAVENOUS
  Filled 2015-06-13 (×4): qty 100

## 2015-06-13 MED ORDER — MAGNESIUM SULFATE 4 GM/100ML IV SOLN
4.0000 g | Freq: Once | INTRAVENOUS | Status: AC
  Administered 2015-06-13: 4 g via INTRAVENOUS
  Filled 2015-06-13: qty 100

## 2015-06-13 NOTE — Progress Notes (Signed)
Report called to [facility] pt going to [phone]

## 2015-06-13 NOTE — Progress Notes (Signed)
PARENTERAL NUTRITION CONSULT NOTE - INITIAL  Pharmacy Consult for Electrolyte/Glucose Management Indication: TPN  No Known Allergies  Patient Measurements: Height: 5\' 5"  (165.1 cm) Weight: 147 lb 4.3 oz (66.8 kg) IBW/kg (Calculated) : 61.5 Adjusted Body Weight: na  Usual Weight: na  Vital Signs: Temp: 98.5 F (36.9 C) (08/31 0400) Temp Source: Oral (08/31 0100) BP: 128/69 mmHg (08/31 0600) Pulse Rate: 93 (08/31 0600) Intake/Output from previous day: 08/30 0701 - 08/31 0700 In: 3521 [I.V.:251; NG/GT:290; IV Piggyback:405; TPN:2450] Out: 1800 [Urine:1300; Emesis/NG output:500] Intake/Output from this shift: Total I/O In: 1665 [Other:125; NG/GT:90; IV Piggyback:100; TPN:1350] Out: 1015 [Urine:715; Emesis/NG output:300]  Labs:  Recent Labs  06/11/15 0206 06/12/15 0423 06/13/15 0439  WBC 25.2* 26.6* 24.7*  HGB 11.6* 11.0* 11.4*  HCT 34.4* 32.2* 33.1*  PLT 245 252 256     Recent Labs  06/11/15 0206 06/12/15 0423 06/12/15 1647 06/13/15 0439  NA 136 135 132* 132*  K 3.3* 3.3* 3.4* 3.4*  CL 102 100* 98* 97*  CO2 29 28 26 28   GLUCOSE 156* 170* 129* 144*  BUN 31* 37* 38* 37*  CREATININE 1.40* 1.33* 1.40* 1.47*  CALCIUM 8.9 8.9 8.9 8.6*  MG 1.8 1.7  --  1.5*  PHOS 2.5 1.5* 2.4* 3.3   Estimated Creatinine Clearance: 29.6 mL/min (by C-G formula based on Cr of 1.47).    Recent Labs  06/12/15 2002 06/13/15 0016 06/13/15 0350  GLUCAP 145* 182* 161*    Medical History: Past Medical History  Diagnosis Date  . Hypertension   . History of hiatal hernia     Medications:  Infusions:  . amiodarone 30 mg/hr (06/13/15 0600)  . Marland KitchenTPN (CLINIMIX-E) Adult 75 mL/hr at 06/13/15 0600   And  . fat emulsion Stopped (06/13/15 0200)  . lactated ringers Stopped (06/13/15 0354)    Assessment: K 3.3, Mg 1.5, phos 3.3.   Plan:  Ordered KCl 10 mEq IV x 4 and magnesium 4 gm IV x 1, will recheck potassium this afternoon and all electrolytes tomorrow morning.   Jesse Macias  A. Jordan Hawks, PharmD Clinical Pharmacist  06/13/2015

## 2015-06-13 NOTE — Progress Notes (Signed)
[place] Progress Note Patient Name: Jesse Macias DOB: June 27, 1924 MRN: [id number]   Date of Service  06/13/2015  HPI/Events of Note  RN notified that K is 2.8. Mg 1.5 this AM w/o replacement.  eICU Interventions  KCl 96mEq IV total & Mag Sulfate 2gm IV.     Intervention Category Major Interventions: Electrolyte abnormality - evaluation and management  Tera Partridge 06/13/2015, 4:19 PM

## 2015-06-13 NOTE — Progress Notes (Signed)
MEDICATION RELATED CONSULT NOTE - FOLLOW UP   Pharmacy Consult for Apixaban (Eliquis) Indication: Apixaban for Afib  No Known Allergies  Patient Measurements: Height: 5\' 5"  (165.1 cm) Weight: 147 lb 4.3 oz (66.8 kg) IBW/kg (Calculated) : 61.5   Vital Signs: Temp: 98.5 F (36.9 C) (08/31 0400) Temp Source: Oral (08/31 0100) BP: 139/65 mmHg (08/31 0833) Pulse Rate: 93 (08/31 0600)  Labs:  Recent Labs  06/11/15 0206 06/12/15 0423 06/12/15 1647 06/13/15 0439  WBC 25.2* 26.6*  --  24.7*  HGB 11.6* 11.0*  --  11.4*  HCT 34.4* 32.2*  --  33.1*  PLT 245 252  --  256  CREATININE 1.40* 1.33* 1.40* 1.47*  MG 1.8 1.7  --  1.5*  PHOS 2.5 1.5* 2.4* 3.3   Estimated Creatinine Clearance: 29.6 mL/min (by C-G formula based on Cr of 1.47).   Microbiology: Recent Results (from the past 720 hour(s))  MRSA PCR Screening     Status: None   Collection Time: 06/08/15 11:36 PM  Result Value Ref Range Status   MRSA by PCR NEGATIVE NEGATIVE Final    Comment:        The GeneXpert MRSA Assay (FDA approved for NASAL specimens only), is one component of a comprehensive MRSA colonization surveillance program. It is not intended to diagnose MRSA infection nor to guide or monitor treatment for MRSA infections.     Medications:  Scheduled:  . apixaban  5 mg Oral BID  . brimonidine  1 drop Both Eyes BID  . dorzolamide  1 drop Both Eyes BID  . hydrALAZINE  25 mg Oral 3 times per day  . hydrochlorothiazide  12.5 mg Oral Daily  . HYDROmorphone PCA 0.3 mg/mL   Intravenous 6 times per day  . hypromellose   Both Eyes QHS  . insulin aspart  0-24 Units Subcutaneous 6 times per day  . ipratropium-albuterol  3 mL Nebulization Q4H  . latanoprost  1 drop Both Eyes QHS  . metoprolol tartrate  25 mg Oral BID  . piperacillin-tazobactam (ZOSYN)  IV  3.375 g Intravenous 3 times per day  . potassium chloride  10 mEq Intravenous Q1 Hr x 4  . timolol  1 drop Both Eyes BID   PRN: [DISCONTINUED]  acetaminophen **OR** acetaminophen, diphenhydrAMINE **OR** diphenhydrAMINE, hydrALAZINE, LORazepam, menthol-cetylpyridinium, metoprolol, naloxone **AND** sodium chloride, ondansetron **OR** ondansetron (ZOFRAN) IV  Assessment: Apixaban: Patient previously on Apixaban 2.5 mg po BID chronically for anticoagulation due to atrial fibrillation. Medication was withheld upon admission due to surgery.  Per Surgery note today , 06/12/15, OK to begin anticoagulation  Plan:  Will continue apixaban 5 mg po bid for now. Patient's SCr today is 1.47 so will follow closely and adjust apixaban if SCr continues to increase.  Pharmacy will continue to follow.  Chinita Greenland PharmD Clinical Pharmacist 06/13/2015 11:13 AM

## 2015-06-13 NOTE — Progress Notes (Signed)
Nutrition Follow-up    INTERVENTION:  PN: Continue TPN at current rate to meet nutritional needs.  Pharmacy following electrolytes Coordination of care: Await surgery recommendations regarding starting po diet.    NUTRITION DIAGNOSIS:   Inadequate oral intake related to acute illness, altered GI function as evidenced by  (NPO/CL since admission).    GOAL:   Patient will meet greater than or equal to 90% of their needs    MONITOR:    (Energy Intake, Digestive System, Electrolyte/Renal Profile, Anthropometrics)  REASON FOR ASSESSMENT:   Consult New TPN/TNA  ASSESSMENT:     Current Nutrition: NPO TPN of 5%AA/20% dextrose at goal rate of 25ml/hr   Gastrointestinal Profile: NG tube with 574ml output noted Last BM: ostomy with 64ml output  Urine oupput: 1357ml noted last 24 hr  Medications: reviewed  Electrolyte/Renal Profile and Glucose Profile:   Recent Labs Lab 06/11/15 0206 06/12/15 0423 06/12/15 1647 06/13/15 0439  NA 136 135 132* 132*  K 3.3* 3.3* 3.4* 3.4*  CL 102 100* 98* 97*  CO2 29 28 26 28   BUN 31* 37* 38* 37*  CREATININE 1.40* 1.33* 1.40* 1.47*  CALCIUM 8.9 8.9 8.9 8.6*  MG 1.8 1.7  --  1.5*  PHOS 2.5 1.5* 2.4* 3.3  GLUCOSE 156* 170* 129* 144*   Protein Profile:  Recent Labs Lab 06/09/15 0539 06/10/15 0552  ALBUMIN 2.6* 2.4*     Weight Trend since Admission: Filed Weights   06/11/15 0500 06/12/15 0500 06/13/15 0500  Weight: 141 lb 1.5 oz (64 kg) 144 lb 2.9 oz (65.4 kg) 147 lb 4.3 oz (66.8 kg)      Diet Order:  Diet NPO time specified Except for: Sips with Meds TPN (CLINIMIX-E) Adult  Skin:  Reviewed, no issues   Height:   Ht Readings from Last 1 Encounters:  06/03/15 5\' 5"  (1.651 m)    Weight:   Wt Readings from Last 1 Encounters:  06/13/15 147 lb 4.3 oz (66.8 kg)       BMI:  Body mass index is 24.51 kg/(m^2).  Estimated Nutritional Needs:   Kcal:  1709-2019 kcals (1195, 1.3 AF, 1.1-1.3 IF)   Protein:   67-85 g (1.1-1.4 g/kg)   Fluid:  1525-1830 mL (25-30 ml/kg)   EDUCATION NEEDS:   No education needs identified at this time  [place]. Zenia Resides, [place], [place] (pager)

## 2015-06-13 NOTE — Evaluation (Signed)
Physical Therapy Evaluation Patient Details Name: Jesse Macias MRN: [id number] DOB: 1924-08-20 Today's Date: 06/13/2015   History of Present Illness  presented to ER with acute abdominal/LLQ pain; admitted with diverticulitis with microperforation.  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 Post-op course complicated by transfer to CCU due to afib with RVR (requiring amioderone drip).  Clinical Impression  Upon evaluation, patient alert and oriented, follows all commands.  Demonstrates strength and ROM grossly WFL and symmetrical in all extremities.  Midline incision, colostomy clean and dry (small output noted in colostomy pouch).  Pain well-controlled at 2-3/10 throughout session without use of PCA pump.  Vitals stable and WFL; no orthostasis noted with transition to upright.  Currently requiring min/mod assist for bed mobility, sit/stand, basic transfers and very short-distance gait (bed/chair x5') with RW.  Very short, shuffling steps; limited balance reactions in A/P plane.  Minimal increase in pain with activity, but does self-report fatigue/SOB (BORG 6-7/10).  Will continue to progress activity as tolerated. Would benefit from skilled PT to address above deficits and promote optimal return to PLOF; recommend transition to STR upon discharge from acute hospitalization.     Follow Up Recommendations SNF    Equipment Recommendations  Rolling walker with 5" wheels    Recommendations for Other Services       Precautions / Restrictions Precautions Precautions: Fall Restrictions Weight Bearing Restrictions: No      Mobility  Bed Mobility Overal bed mobility: Needs Assistance Bed Mobility: Supine to Sit     Supine to sit: Min assist;Mod assist        Transfers Overall transfer level: Needs assistance Equipment used: Rolling walker (2 wheeled) Transfers: Sit to/from Stand Sit to Stand:  Min assist;Mod assist            Ambulation/Gait Ambulation/Gait assistance: Min assist;Mod assist Ambulation Distance (Feet): 5 Feet Assistive device: Rolling walker (2 wheeled)       General Gait Details: very short, shuffling steps, limited postural extension.  Increased posterior weight shift; limited balance/righting reactions in A/P plane.  Stairs            Wheelchair Mobility    Modified Rankin (Stroke Patients Only)       Balance Overall balance assessment: Needs assistance Sitting-balance support: No upper extremity supported;Feet supported Sitting balance-Leahy Scale: Good     Standing balance support: No upper extremity supported Standing balance-Leahy Scale: Fair                               Pertinent Vitals/Pain Pain Assessment: 0-10 Pain Score: 2  Pain Location: abdomen Pain Descriptors / Indicators: Aching Pain Intervention(s): Limited activity within patient's tolerance;Monitored during session;Repositioned;PCA encouraged    Home Living Family/patient expects to be discharged to:: Private residence Living Arrangements: Spouse/significant other Available Help at Discharge: Family Type of Home: House Home Access: Stairs to enter Entrance Stairs-Rails: None Entrance Stairs-Number of Steps: 2 Home Layout: One level        Prior Function Level of Independence: Independent         Comments: Indep with household and community mobility; active in outdoor garden.  Denies fall history.     Hand Dominance        Extremity/Trunk Assessment   Upper Extremity Assessment: Overall WFL for tasks assessed           Lower Extremity Assessment: Overall WFL for tasks assessed  Communication   Communication: No difficulties  Cognition Arousal/Alertness: Awake/alert Behavior During Therapy: WFL for tasks assessed/performed Overall Cognitive Status: Within Functional Limits for tasks assessed                       General Comments General comments (skin integrity, edema, etc.): midline abdominal incision (covered with abd pad); L LQ colostomy; L chest central line    Exercises Other Exercises Other Exercises: Supine LE therex, 1x10, AROM for muscular strength/endurance: ankle pumps, quad sets, heel slides, hip abduct/adduct. (8 min)      Assessment/Plan    PT Assessment Patient needs continued PT services  PT Diagnosis Difficulty walking;Generalized weakness;Acute pain   PT Problem List Decreased strength;Decreased range of motion;Decreased activity tolerance;Decreased balance;Decreased mobility;Decreased coordination;Decreased knowledge of use of DME;Decreased safety awareness;Decreased knowledge of precautions;Pain  PT Treatment Interventions DME instruction;Gait training;Stair training;Functional mobility training;Therapeutic exercise;Therapeutic activities;Patient/family education;Balance training   PT Goals (Current goals can be found in the Care Plan section) Acute Rehab PT Goals Patient Stated Goal: "to try to get up" PT Goal Formulation: With patient Time For Goal Achievement: 06/27/15 Potential to Achieve Goals: Good    Frequency Min 2X/week   Barriers to discharge Decreased caregiver support      Co-evaluation               End of Session Equipment Utilized During Treatment: Gait belt Activity Tolerance: Patient tolerated treatment well Patient left: in chair;with call bell/phone within reach;with nursing/sitter in room;with family/visitor present Nurse Communication: Mobility status         Time: [id number] PT Time Calculation (min) (ACUTE ONLY): 23 min   Charges:   PT Evaluation $Initial PT Evaluation Tier I: 1 Procedure PT Treatments $Therapeutic Exercise: 8-22 mins   PT G Codes:        Raef Sprigg H. Owens Shark, PT, DPT, NCS 06/13/2015, 9:09 AM [phone]

## 2015-06-13 NOTE — Progress Notes (Signed)
PARENTERAL NUTRITION CONSULT NOTE - INITIAL  Pharmacy Consult for Electrolyte/Glucose Management Indication: TPN  No Known Allergies  Patient Measurements: Height: 5\' 5"  (165.1 cm) Weight: 147 lb 4.3 oz (66.8 kg) IBW/kg (Calculated) : 61.5 Adjusted Body Weight: na  Usual Weight: na  Vital Signs: Temp: 97.7 F (36.5 C) (08/31 0800) Temp Source: Oral (08/31 0800) BP: 114/65 mmHg (08/31 1100) Pulse Rate: 78 (08/31 1100) Intake/Output from previous day: 08/30 0701 - 08/31 0700 In: 3521 [I.V.:251; NG/GT:290; IV Piggyback:405; TPN:2450] Out: 1800 [Urine:1300; Emesis/NG output:500] Intake/Output from this shift:    Labs:  Recent Labs  06/11/15 0206 06/12/15 0423 06/13/15 0439  WBC 25.2* 26.6* 24.7*  HGB 11.6* 11.0* 11.4*  HCT 34.4* 32.2* 33.1*  PLT 245 252 256     Recent Labs  06/11/15 0206 06/12/15 0423 06/12/15 1647 06/13/15 0439 06/13/15 1500  NA 136 135 132* 132* 135  K 3.3* 3.3* 3.4* 3.4* 2.8*  CL 102 100* 98* 97* 109  CO2 29 28 26 28  21*  GLUCOSE 156* 170* 129* 144* 106*  BUN 31* 37* 38* 37* 33*  CREATININE 1.40* 1.33* 1.40* 1.47* 1.10  CALCIUM 8.9 8.9 8.9 8.6* 6.8*  MG 1.8 1.7  --  1.5*  --   PHOS 2.5 1.5* 2.4* 3.3  --    Estimated Creatinine Clearance: 39.6 mL/min (by C-G formula based on Cr of 1.1).    Recent Labs  06/13/15 0350 06/13/15 0818 06/13/15 1208  GLUCAP 161* 172* 150*    Medical History: Past Medical History  Diagnosis Date  . Hypertension   . History of hiatal hernia     Medications:  Infusions:  . Marland KitchenTPN (CLINIMIX-E) Adult    . amiodarone 30 mg/hr (06/13/15 1319)  . lactated ringers Stopped (06/13/15 0354)  . Marland KitchenTPN (CLINIMIX-E) Adult 75 mL/hr at 06/13/15 0600    Assessment: 8/31 am labs: K 3.3, Mg 1.5, phos 3.3.  8/31 Labs at 1500:  K 2.8   Plan:  Ordered KCl 10 mEq IV x 4 and magnesium 4 gm IV x 1, will recheck potassium this afternoon and all electrolytes tomorrow morning.   8/31 at 1650:  Dr. Ashok Cordia has ordered  KCL 56meq IV x 1 replacement and Magnesium 2 gram IV x 1. Follow labs in am.  Chinita Greenland PharmD Clinical Pharmacist 06/13/2015 4:50 PM

## 2015-06-13 NOTE — Progress Notes (Signed)
[place] [facility] [place] NAME: Jesse Macias    MR#:  [id number]  DATE OF BIRTH:  [birth year]  SUBJECTIVE:  Patient sitting in chair this morning without any issues overnight. Patient's heart rate is better controlled. REVIEW OF SYSTEMS:    Review of Systems  Constitutional: Negative for fever, chills and malaise/fatigue.  HENT: Negative for sore throat.   Eyes: Negative for blurred vision.  Respiratory: Negative for cough, hemoptysis, shortness of breath and wheezing.   Cardiovascular: Negative for chest pain, palpitations and leg swelling.  Gastrointestinal: Negative for nausea, vomiting, abdominal pain, diarrhea and blood in stool.  Genitourinary: Negative for dysuria.  Musculoskeletal: Negative for back pain.  Neurological: Negative for dizziness, tremors and headaches.  Endo/Heme/Allergies: Does not bruise/bleed easily.    Tolerating Diet:  On TPN     DRUG ALLERGIES:  No Known Allergies  VITALS:  Blood pressure 114/65, pulse 78, temperature 97.7 F (36.5 C), temperature source Oral, resp. rate 27, height 5\' 5"  (1.651 m), weight 66.8 kg (147 lb 4.3 oz), SpO2 98 %.  PHYSICAL EXAMINATION:   Physical Exam  Constitutional: He is oriented to person, place, and time and well-developed, well-nourished, and in no distress. No distress.  HENT:  Head: Normocephalic.  Eyes: No scleral icterus.  Neck: Normal range of motion. Neck supple. No JVD present. No tracheal deviation present.  Cardiovascular: Normal rate, regular rhythm and normal heart sounds.  Exam reveals no gallop and no friction rub.   No murmur heard. Pulmonary/Chest: Effort normal and breath sounds normal. No respiratory distress. He has no wheezes. He has no rales. He exhibits no tenderness.  Abdominal: Soft. He exhibits no distension and no mass. There is tenderness. There is no rebound and no guarding.  Penrose drain  Musculoskeletal: Normal range of motion. He  exhibits no edema.  Neurological: He is alert and oriented to person, place, and time.  Skin: Skin is warm. No rash noted. No erythema.  Psychiatric: Affect and judgment normal.      LABORATORY PANEL:   CBC  Recent Labs Lab 06/13/15 0439  WBC 24.7*  HGB 11.4*  HCT 33.1*  PLT 256   ------------------------------------------------------------------------------------------------------------------  Chemistries   Recent Labs Lab 06/10/15 0552  06/13/15 0439  NA 136  < > 132*  K 3.5  < > 3.4*  CL 101  < > 97*  CO2 28  < > 28  GLUCOSE 199*  < > 144*  BUN 28*  < > 37*  CREATININE 1.42*  < > 1.47*  CALCIUM 8.9  < > 8.6*  MG 1.5*  < > 1.5*  AST 23  --   --   ALT 14*  --   --   ALKPHOS 46  --   --   BILITOT 1.5*  --   --   < > = values in this interval not displayed. ------------------------------------------------------------------------------------------------------------------  Cardiac Enzymes No results for input(s): TROPONINI in the last 168 hours. ------------------------------------------------------------------------------------------------------------------  RADIOLOGY:  Dg Chest 1 View  06/12/2015   CLINICAL DATA:  Shortness of breath  EXAM: CHEST  1 VIEW  COMPARISON:  06/09/2015  FINDINGS: Left central line tip is in the SVC. NG tube enters the stomach. Mild cardiomegaly. No confluent airspace opacities or effusions. No acute bony abnormality.  IMPRESSION: Mild cardiomegaly.  No active disease.   Electronically Signed   By: Rolm Baptise M.D.   On: 06/12/2015 12:55     ASSESSMENT AND PLAN:  81 year old male with recurrent diverticulitis status post Hartmann procedure.  1. 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 Heart rate is better controlled. He did not respond well with the diltiazam drip. Cardiology consultation is following patient. Elquis was initially held for surgery but has beenrestarted  as per surgery consultation. Pharmacy is assisting with this. Patient will continue metoprolol for rate control and amiodarone for now. Once patient is eating amiodarone can be changed to oral.  2. Uncontrolled hypertension: Patient's blood pressure has much improved. Continue metoprolol, HCTZ and hydralazine.  3. Glaucoma,: Continue atenolol and XALATAN eyedrops  4. Acute renal failure: Improved with IV fluids.  5. Perforated diverticulitis: Patient is status post Hartman's procedure. As per surgery.        Management plans discussed with the patient and family and they are in agreement.  CODE STATUS: FULL  TOTAL TIME TAKING CARE OF THIS PATIENT: 25 minutes.     POSSIBLE D/C 3-5 days, DEPENDING ON CLINICAL CONDITION.   Oriah Leinweber M.D on 06/13/2015 at 1:06 PM  Between 7am to 6pm - Pager - [phone] After 6pm go to [email or web address] - password [email or web address] [email or web address]  [place] [facility]  Office  [phone]  CC: Primary care physician; No primary care provider on file.

## 2015-06-13 NOTE — Consult Note (Signed)
ANTIBIOTIC CONSULT NOTE - FOLLOW UP  Pharmacy Consult for Zosyn Indication: Diverticulitis with perforation  No Known Allergies  Patient Measurements: Height: 5\' 5"  (165.1 cm) Weight: 147 lb 4.3 oz (66.8 kg) IBW/kg (Calculated) : 61.5   Vital Signs: Temp: 98.5 F (36.9 C) (08/31 0400) Temp Source: Oral (08/31 0100) BP: 139/65 mmHg (08/31 0833) Pulse Rate: 93 (08/31 0600) Intake/Output from previous day: 08/30 0701 - 08/31 0700 In: 3521 [I.V.:251; NG/GT:290; IV Piggyback:405; TPN:2450] Out: 1800 [Urine:1300; Emesis/NG output:500] Intake/Output from this shift:    Labs:  Recent Labs  06/11/15 0206 06/12/15 0423 06/12/15 1647 06/13/15 0439  WBC 25.2* 26.6*  --  24.7*  HGB 11.6* 11.0*  --  11.4*  PLT 245 252  --  256  CREATININE 1.40* 1.33* 1.40* 1.47*   Estimated Creatinine Clearance: 29.6 mL/min (by C-G formula based on Cr of 1.47). No results for input(s): VANCOTROUGH, VANCOPEAK, VANCORANDOM, GENTTROUGH, GENTPEAK, GENTRANDOM, TOBRATROUGH, TOBRAPEAK, TOBRARND, AMIKACINPEAK, AMIKACINTROU, AMIKACIN in the last 72 hours.   Microbiology: Recent Results (from the past 720 hour(s))  MRSA PCR Screening     Status: None   Collection Time: 06/08/15 11:36 PM  Result Value Ref Range Status   MRSA by PCR NEGATIVE NEGATIVE Final    Comment:        The GeneXpert MRSA Assay (FDA approved for NASAL specimens only), is one component of a comprehensive MRSA colonization surveillance program. It is not intended to diagnose MRSA infection nor to guide or monitor treatment for MRSA infections.     Anti-infectives    Start     Dose/Rate Route Frequency Ordered Stop   06/03/15 1000  piperacillin-tazobactam (ZOSYN) IVPB 3.375 g     3.375 g 12.5 mL/hr over 240 Minutes Intravenous 3 times per day 06/02/15 2348     06/03/15 0026  piperacillin-tazobactam (ZOSYN) 3.375 (3-0.375) G injection    Comments:  BRUMGARD, APRIL: cabinet override      06/03/15 0026 06/03/15 0146   06/02/15 2330  piperacillin-tazobactam (ZOSYN) IVPB 3.375 g     3.375 g 100 mL/hr over 30 Minutes Intravenous  Once 06/02/15 2316 06/03/15 0215      Assessment: 81 y/o M s/p Hartmann's procedure.   Plan:  Will continue Zosyn 3.375 g EI q 8 hours.   Ulice Dash D 06/13/2015,11:26 AM

## 2015-06-13 NOTE — Progress Notes (Signed)
Dr. Benjie Karvonen notified of pt's potassium of 2.8. Dr. Benjie Karvonen ordered to have pharmacy monitor electrolytes. Order previously entered. Icey Tello E 4:48 PM 06/13/2015

## 2015-06-13 NOTE — Progress Notes (Signed)
Surgery Progress Note  S: No acute issues.  + stool O: Blood pressure 114/65, pulse 78, temperature 97.7 F (36.5 C), temperature source Oral, resp. rate 27, height 5\' 5"  (1.651 m), weight 147 lb 4.3 oz (66.8 kg), SpO2 98 %. GEN: NAD/A&Ox3 ABD: soft, min tender, nondistended, ostomy with stool  Labs:  WBC 24.7  A/P 81 yo s/p hartmanns for diverticulitis, doing well - d/c ng  - okay to send to floor from surgical standpoint

## 2015-06-14 ENCOUNTER — Inpatient Hospital Stay: Payer: Medicare Other

## 2015-06-14 LAB — CBC WITH DIFFERENTIAL/PLATELET
BAND NEUTROPHILS: 1 % (ref 0–10)
BASOS PCT: 0 % (ref 0–1)
Basophils Absolute: 0 10*3/uL (ref 0.0–0.1)
Blasts: 0 %
EOS ABS: 0.6 10*3/uL (ref 0.0–0.7)
EOS PCT: 3 % (ref 0–5)
HCT: 30.2 % — ABNORMAL LOW (ref 40.0–52.0)
Hemoglobin: 10.2 g/dL — ABNORMAL LOW (ref 13.0–18.0)
LYMPHS ABS: 1 10*3/uL (ref 0.7–4.0)
LYMPHS PCT: 5 % — AB (ref 12–46)
MCH: 31.2 pg (ref 26.0–34.0)
MCHC: 33.6 g/dL (ref 32.0–36.0)
MCV: 92.8 fL (ref 80.0–100.0)
MONO ABS: 2.2 10*3/uL — AB (ref 0.1–1.0)
MONOS PCT: 11 % (ref 3–12)
Metamyelocytes Relative: 0 %
Myelocytes: 1 %
NEUTROS ABS: 16.1 10*3/uL — AB (ref 1.7–7.7)
NEUTROS PCT: 79 % — AB (ref 43–77)
NRBC: 0 /100{WBCs}
OTHER: 0 %
PLATELETS: 271 10*3/uL (ref 150–440)
Promyelocytes Absolute: 0 %
RBC: 3.26 MIL/uL — ABNORMAL LOW (ref 4.40–5.90)
RDW: 15.8 % — ABNORMAL HIGH (ref 11.5–14.5)
Smear Review: ADEQUATE
WBC: 19.9 10*3/uL — ABNORMAL HIGH (ref 3.8–10.6)

## 2015-06-14 LAB — GLUCOSE, CAPILLARY
GLUCOSE-CAPILLARY: 148 mg/dL — AB (ref 65–99)
GLUCOSE-CAPILLARY: 153 mg/dL — AB (ref 65–99)
GLUCOSE-CAPILLARY: 167 mg/dL — AB (ref 65–99)
Glucose-Capillary: 169 mg/dL — ABNORMAL HIGH (ref 65–99)
Glucose-Capillary: 131 mg/dL — ABNORMAL HIGH (ref 65–99)

## 2015-06-14 LAB — BASIC METABOLIC PANEL
ANION GAP: 6 (ref 5–15)
BUN: 43 mg/dL — ABNORMAL HIGH (ref 6–20)
CALCIUM: 8.6 mg/dL — AB (ref 8.9–10.3)
CHLORIDE: 98 mmol/L — AB (ref 101–111)
CO2: 26 mmol/L (ref 22–32)
CREATININE: 1.62 mg/dL — AB (ref 0.61–1.24)
GFR calc non Af Amer: 36 mL/min — ABNORMAL LOW (ref >60)
GFR, EST AFRICAN AMERICAN: 42 mL/min — AB (ref >60)
GLUCOSE: 134 mg/dL — AB (ref 65–99)
Potassium: 4.1 mmol/L (ref 3.5–5.1)
Sodium: 130 mmol/L — ABNORMAL LOW (ref 135–145)

## 2015-06-14 LAB — MAGNESIUM: MAGNESIUM: 2.2 mg/dL (ref 1.7–2.4)

## 2015-06-14 LAB — PHOSPHORUS: Phosphorus: 2.4 mg/dL — ABNORMAL LOW (ref 2.5–4.6)

## 2015-06-14 MED ORDER — APIXABAN 2.5 MG PO TABS
2.5000 mg | ORAL_TABLET | Freq: Two times a day (BID) | ORAL | Status: DC
  Administered 2015-06-14 – 2015-06-15 (×4): 2.5 mg via ORAL
  Filled 2015-06-14 (×4): qty 1

## 2015-06-14 MED ORDER — SODIUM PHOSPHATE 3 MMOLE/ML IV SOLN
12.0000 mmol | Freq: Once | INTRAVENOUS | Status: AC
  Administered 2015-06-14: 12 mmol via INTRAVENOUS
  Filled 2015-06-14: qty 4

## 2015-06-14 MED ORDER — FAT EMULSION 20 % IV EMUL
500.0000 mL | INTRAVENOUS | Status: DC
  Administered 2015-06-14: 500 mL via INTRAVENOUS
  Filled 2015-06-14 (×3): qty 500

## 2015-06-14 MED ORDER — TRACE MINERALS CR-CU-MN-SE-ZN 10-1000-500-60 MCG/ML IV SOLN
INTRAVENOUS | Status: DC
  Administered 2015-06-14: 18:00:00 via INTRAVENOUS
  Filled 2015-06-14: qty 1800

## 2015-06-14 MED ORDER — INSULIN ASPART 100 UNIT/ML ~~LOC~~ SOLN
0.0000 [IU] | Freq: Four times a day (QID) | SUBCUTANEOUS | Status: DC
  Administered 2015-06-14: 1 [IU] via SUBCUTANEOUS
  Administered 2015-06-14: 3 [IU] via SUBCUTANEOUS
  Administered 2015-06-14: 2 [IU] via SUBCUTANEOUS
  Administered 2015-06-14: 1 [IU] via SUBCUTANEOUS
  Administered 2015-06-15 – 2015-06-16 (×5): 2 [IU] via SUBCUTANEOUS
  Administered 2015-06-16: 3 [IU] via SUBCUTANEOUS
  Administered 2015-06-16: 2 [IU] via SUBCUTANEOUS
  Administered 2015-06-17 (×2): 3 [IU] via SUBCUTANEOUS
  Administered 2015-06-17: 5 [IU] via SUBCUTANEOUS
  Administered 2015-06-17: 3 [IU] via SUBCUTANEOUS
  Administered 2015-06-18: 2 [IU] via SUBCUTANEOUS
  Administered 2015-06-18 (×2): 3 [IU] via SUBCUTANEOUS
  Administered 2015-06-18: 2 [IU] via SUBCUTANEOUS
  Administered 2015-06-19: 3 [IU] via SUBCUTANEOUS
  Filled 2015-06-14: qty 1
  Filled 2015-06-14: qty 3
  Filled 2015-06-14 (×2): qty 2
  Filled 2015-06-14: qty 5
  Filled 2015-06-14 (×2): qty 3
  Filled 2015-06-14 (×3): qty 2
  Filled 2015-06-14: qty 3
  Filled 2015-06-14: qty 1
  Filled 2015-06-14: qty 2
  Filled 2015-06-14: qty 3
  Filled 2015-06-14 (×4): qty 2
  Filled 2015-06-14 (×2): qty 3

## 2015-06-14 MED ORDER — IPRATROPIUM-ALBUTEROL 0.5-2.5 (3) MG/3ML IN SOLN
3.0000 mL | RESPIRATORY_TRACT | Status: DC | PRN
  Administered 2015-06-15 – 2015-06-16 (×3): 3 mL via RESPIRATORY_TRACT
  Filled 2015-06-14 (×3): qty 3

## 2015-06-14 MED ORDER — SODIUM CHLORIDE 0.9 % IJ SOLN
10.0000 mL | INTRAMUSCULAR | Status: DC | PRN
  Administered 2015-06-14: 10 mL via INTRAVENOUS
  Filled 2015-06-14: qty 10

## 2015-06-14 MED ORDER — INSULIN ASPART 100 UNIT/ML ~~LOC~~ SOLN: 0.0000 [IU] | SUBCUTANEOUS | Status: DC

## 2015-06-14 MED ORDER — SODIUM CHLORIDE 0.9 % IV SOLN
INTRAVENOUS | Status: DC
  Administered 2015-06-14 – 2015-06-15 (×2): via INTRAVENOUS

## 2015-06-14 MED ORDER — HYDROMORPHONE HCL 1 MG/ML IJ SOLN
0.5000 mg | INTRAMUSCULAR | Status: DC | PRN
  Administered 2015-06-14 – 2015-06-24 (×10): 0.5 mg via INTRAVENOUS
  Filled 2015-06-14 (×10): qty 1

## 2015-06-14 NOTE — Progress Notes (Signed)
The patient's PCA pump did not have SpO2 chamber with it, this RN called supply and [employer] to obtain the correct cord & connection for this part of the pump. However, none were available per supply or [employer]. Therefore, in order to have continuous SpO2 monitored correctly, this RN applied former telemetry leads with the SpO2 component so that this can be monitored. Telemetry clerk was notified of this and patients continuous SpO2 is being monitored and projected on telemetry screens.

## 2015-06-14 NOTE — Care Management Important Message (Signed)
Important Message  Patient Details  Name: Jesse Macias MRN: [id number] Date of Birth: 09-06-1924   Medicare Important Message Given:  Yes-third notification given    [name] 06/14/2015, 1:31 PM

## 2015-06-14 NOTE — Progress Notes (Addendum)
PARENTERAL NUTRITION CONSULT NOTE - Follow up  Pharmacy Consult for Electrolyte/Glucose Management Indication: TPN  No Known Allergies  Patient Measurements: Height: 5\' 5"  (165.1 cm) Weight: 152 lb 12.8 oz (69.31 kg) IBW/kg (Calculated) : 61.5 Adjusted Body Weight: na  Usual Weight: na  Vital Signs: Temp: 98.1 F (36.7 C) (09/01 0516) Temp Source: Oral (09/01 0516) BP: 142/70 mmHg (09/01 0516) Pulse Rate: 84 (09/01 0516) Intake/Output from previous day: 08/31 0701 - 09/01 0700 In: 1235.8 [I.V.:336.3; IV Piggyback:200; TPN:699.5] Out: 1150 [Urine:1125; Stool:25] Intake/Output from this shift:    Labs:  Recent Labs  06/12/15 0423 06/13/15 0439 06/14/15 0506  WBC 26.6* 24.7* 19.9*  HGB 11.0* 11.4* 10.2*  HCT 32.2* 33.1* 30.2*  PLT 252 256 271     Recent Labs  06/12/15 0423 06/12/15 1647 06/13/15 0439 06/13/15 1500 06/14/15 0506  NA 135 132* 132* 135 130*  K 3.3* 3.4* 3.4* 2.8* 4.1  CL 100* 98* 97* 109 98*  CO2 28 26 28  21* 26  GLUCOSE 170* 129* 144* 106* 134*  BUN 37* 38* 37* 33* 43*  CREATININE 1.33* 1.40* 1.47* 1.10 1.62*  CALCIUM 8.9 8.9 8.6* 6.8* 8.6*  MG 1.7  --  1.5*  --  2.2  PHOS 1.5* 2.4* 3.3  --  2.4*   Estimated Creatinine Clearance: 26.9 mL/min (by C-G formula based on Cr of 1.62).    Recent Labs  06/13/15 1208 06/13/15 1951 06/13/15 2351  GLUCAP 150* 139* 157*    Medical History: Past Medical History  Diagnosis Date  . Hypertension   . History of hiatal hernia     Medications:  Infusions:  . Marland KitchenTPN (CLINIMIX-E) Adult 75 mL/hr at 06/13/15 2126  . amiodarone 30 mg/hr (06/14/15 0103)  . lactated ringers Stopped (06/13/15 0354)    Assessment: 8/31 am labs: K 3.3, Mg 1.5, phos 3.3.  8/31 Labs at 1500:  K 2.8   Plan:  Ordered KCl 10 mEq IV x 4 and magnesium 4 gm IV x 1, will recheck potassium this afternoon and all electrolytes tomorrow morning.   8/31 at 1650:  Dr. Ashok Cordia has ordered KCL 33meq IV x 1 replacement and  Magnesium 2 gram IV x 1. Follow labs in am.  0901 AM: Na 130, K 4.1, Phos 2.4, Mg 2.2. Ordered sodium phosphate 12 mmol IV x 1. Will follow up electrolytes tomorrow morning.   Nathan A. Jordan Hawks, PharmD Clinical Pharmacist 06/14/2015 7:17 AM   Addendum:  BG 139-167 on SSI, within goal range. No hx of DM noted. Currently ordered SSI 0-24 units 6x per day.  Will change SSI scale to 0-9 (sensitive scale) q6h.   Rayna Sexton, PharmD, BCPS Clinical Pharmacist 06/14/2015 8:18 AM

## 2015-06-14 NOTE — Clinical Social Work Note (Signed)
Clinical Social Work Assessment  Patient Details  Name: Jesse Macias MRN: [id number] Date of Birth: 31-Oct-1923  Date of referral:  06/14/15               Reason for consult:  Facility Placement                Permission sought to share information with:    Permission granted to share information::  Yes, Verbal Permission Granted  Name::     [name] [address] [place]::  [facility]F facilities  Relationship::     Contact Information:     Housing/Transportation Living arrangements for the past 2 months:  [place] of Information:  Patient, Spouse, Medical Team Patient Interpreter Needed:  None Criminal Activity/Legal Involvement Pertinent to Current Situation/Hospitalization:  No - Comment as needed Significant Relationships:  Spouse Lives with:  Spouse Do you feel safe going back to the place where you live?  No Need for family participation in patient care:  Yes (Comment)  Care giving concerns:  Pt's wife stated that she would like to see if pt will improve enough to return home after DC, but she is willing to consider SNF placement.     Social Worker assessment / plan:  CSW spoke to pt.  He was oriented when speaking to CSW, however he fell during assessment.  CSW was able to speak to pt's wife who confirmed that the lived together.  CSW explained what SNF placement was and explained that that was the recommendation for the pt at this time.  She stated that she would like time to look at the facilities before making a decision about possible placement.  CSW will also wait until pt has improved more before sending out a bed search.  CSW will start FL2.  Employment status:  Disabled (Comment on whether or not currently receiving Disability), Retired Forensic scientist:  Medicare PT Recommendations:  [place] / Referral to community resources:  [place]  Patient/Family's Response to care:  Pt and his wife would like  to go home, however are open to the idea of SNF placement if home is not the best option.  Patient/Family's Understanding of and Emotional Response to Diagnosis, Current Treatment, and Prognosis:  Pt's wife thanked CSW and verbalized her understand of SNF placement.  Emotional Assessment Appearance:    Attitude/Demeanor/Rapport:   (appropriate) Affect (typically observed):  Appropriate Orientation:  Oriented to Self, Oriented to Place, Oriented to  Time, Oriented to Situation Alcohol / Substance use:  Never Used Psych involvement (Current and /or in the community):  No (Comment)  Discharge Needs  Concerns to be addressed:  Care Coordination, Other (Comment Required (pt currently still needs medical work up) Readmission within the last 30 days:  No Current discharge risk:  None Barriers to Discharge:  Continued Medical Work up   [employer], LCSW 06/14/2015, 3:39 PM

## 2015-06-14 NOTE — Progress Notes (Signed)
Skin verified upon transfer by Cyril Mourning.

## 2015-06-14 NOTE — Progress Notes (Signed)
MEDICATION RELATED CONSULT NOTE - FOLLOW UP   Pharmacy Consult for Apixaban (Eliquis) Indication: Apixaban for Afib  No Known Allergies  Patient Measurements: Height: 5\' 5"  (165.1 cm) Weight: 152 lb 12.8 oz (69.31 kg) IBW/kg (Calculated) : 61.5   Vital Signs: Temp: 98.9 F (37.2 C) (09/01 0759) Temp Source: Oral (09/01 0759) BP: 130/66 mmHg (09/01 0759) Pulse Rate: 89 (09/01 0759)  Labs:  Recent Labs  06/12/15 0423 06/12/15 1647 06/13/15 0439 06/13/15 1500 06/14/15 0506  WBC 26.6*  --  24.7*  --  19.9*  HGB 11.0*  --  11.4*  --  10.2*  HCT 32.2*  --  33.1*  --  30.2*  PLT 252  --  256  --  271  CREATININE 1.33* 1.40* 1.47* 1.10 1.62*  MG 1.7  --  1.5*  --  2.2  PHOS 1.5* 2.4* 3.3  --  2.4*   Estimated Creatinine Clearance: 26.9 mL/min (by C-G formula based on Cr of 1.62).   Microbiology: Recent Results (from the past 720 hour(s))  MRSA PCR Screening     Status: None   Collection Time: 06/08/15 11:36 PM  Result Value Ref Range Status   MRSA by PCR NEGATIVE NEGATIVE Final    Comment:        The GeneXpert MRSA Assay (FDA approved for NASAL specimens only), is one component of a comprehensive MRSA colonization surveillance program. It is not intended to diagnose MRSA infection nor to guide or monitor treatment for MRSA infections.     Medications:  Scheduled:  . apixaban  5 mg Oral BID  . brimonidine  1 drop Both Eyes BID  . dorzolamide  1 drop Both Eyes BID  . hydrALAZINE  25 mg Oral 3 times per day  . hydrochlorothiazide  12.5 mg Oral Daily  . HYDROmorphone PCA 0.3 mg/mL   Intravenous 6 times per day  . hypromellose   Both Eyes QHS  . insulin aspart  0-24 Units Subcutaneous 6 times per day  . ipratropium-albuterol  3 mL Nebulization Q4H  . latanoprost  1 drop Both Eyes QHS  . metoprolol tartrate  25 mg Oral BID  . piperacillin-tazobactam (ZOSYN)  IV  3.375 g Intravenous 3 times per day  . sodium phosphate  Dextrose 5% IVPB  12 mmol Intravenous  Once  . timolol  1 drop Both Eyes BID   PRN: [DISCONTINUED] acetaminophen **OR** acetaminophen, diphenhydrAMINE **OR** diphenhydrAMINE, hydrALAZINE, LORazepam, menthol-cetylpyridinium, metoprolol, naloxone **AND** sodium chloride, ondansetron **OR** ondansetron (ZOFRAN) IV, sodium chloride  Assessment: Apixaban: Patient previously on Apixaban 2.5 mg po BID chronically for anticoagulation due to atrial fibrillation. Medication was withheld upon admission due to surgery.  Per Surgery note today , 06/12/15, OK to begin anticoagulation  Plan:  Current orders for apixaban 5 mg po bid. Patient's SCr today is 1.62. Will adjust apixaban to 2.5 mg PO BID based on SCr >1.5 and age >80 years.   Pharmacy will continue to follow.    Rayna Sexton, PharmD, BCPS Clinical Pharmacist 06/14/2015 8:09 AM

## 2015-06-14 NOTE — Progress Notes (Signed)
Surgery Progress Note  S:  Feels worse, increased pain O: Blood pressure 130/66, pulse 89, temperature 98.9 F (37.2 C), temperature source Oral, resp. rate 28, height 5\' 5"  (1.651 m), weight 152 lb 12.8 oz (69.31 kg), SpO2 100 %. GEN: NAD/A&Ox3 ABD: soft, mild tender to palpation, nondistended, ostomy pink with some output  WBC 19.9  A/P 81 yo s/p hartmanns.  Some increased pain. - pain control, encouraged PCA - PT - CXR to look for leukocytosis

## 2015-06-14 NOTE — Care Management (Signed)
Patient with complicated surgical hospital course trasfewrred to ICU from [facility] due to a fib with RVR.  Transferred out of ICU to 2A  On 8/31.  He is on an amiodarone drip, TPN and PCA for pain.  Skilled nursing facility placement has been recommended

## 2015-06-14 NOTE — Progress Notes (Signed)
[place] at [place] NAME: Jesse Macias    MR#:  [id number]  DATE OF BIRTH:  Feb 28, 1924  SUBJECTIVE:  About the same. Patient's heart rate is better controlled. family at bedside. He seems lethargic and sleepy. REVIEW OF SYSTEMS:    Review of Systems  Constitutional: Negative for fever, chills and malaise/fatigue.  HENT: Negative for sore throat.   Eyes: Negative for blurred vision.  Respiratory: Negative for cough, hemoptysis, shortness of breath and wheezing.   Cardiovascular: Negative for chest pain, palpitations and leg swelling.  Gastrointestinal: Negative for nausea, vomiting, abdominal pain, diarrhea and blood in stool.  Genitourinary: Negative for dysuria.  Musculoskeletal: Negative for back pain.  Neurological: Negative for dizziness, tremors and headaches.  Endo/Heme/Allergies: Does not bruise/bleed easily.    Tolerating Diet:  On TPN DRUG ALLERGIES:  No Known Allergies VITALS:  Blood pressure 130/66, pulse 89, temperature 98.6 F (37 C), temperature source Oral, resp. rate 28, height 5\' 5"  (1.651 m), weight 69.31 kg (152 lb 12.8 oz), SpO2 100 %. PHYSICAL EXAMINATION:   Physical Exam  Constitutional: He is oriented to person, place, and time and well-developed, well-nourished, and in no distress. No distress.  HENT:  Head: Normocephalic.  Eyes: No scleral icterus.  Neck: Normal range of motion. Neck supple. No JVD present. No tracheal deviation present.  Cardiovascular: Normal rate, regular rhythm and normal heart sounds.  Exam reveals no gallop and no friction rub.   No murmur heard. Pulmonary/Chest: Effort normal and breath sounds normal. No respiratory distress. He has no wheezes. He has no rales. He exhibits no tenderness.  Abdominal: Soft. He exhibits no distension and no mass. There is tenderness. There is no rebound and no guarding.  Penrose drain  Musculoskeletal: Normal range of motion. He exhibits no  edema.  Neurological: He is alert and oriented to person, place, and time.  Skin: Skin is warm. No rash noted. No erythema.  Psychiatric: Affect and judgment normal.   LABORATORY PANEL:   CBC  Recent Labs Lab 06/14/15 0506  WBC 19.9*  HGB 10.2*  HCT 30.2*  PLT 271   ------------------------------------------------------------------------------------------------------------------  Chemistries   Recent Labs Lab 06/10/15 0552  06/14/15 0506  NA 136  < > 130*  K 3.5  < > 4.1  CL 101  < > 98*  CO2 28  < > 26  GLUCOSE 199*  < > 134*  BUN 28*  < > 43*  CREATININE 1.42*  < > 1.62*  CALCIUM 8.9  < > 8.6*  MG 1.5*  < > 2.2  AST 23  --   --   ALT 14*  --   --   ALKPHOS 46  --   --   BILITOT 1.5*  --   --   < > = values in this interval not displayed. ------------------------------------------------------------------------------------------------------------------  Cardiac Enzymes No results for input(s): TROPONINI in the last 168 hours. ------------------------------------------------------------------------------------------------------------------  RADIOLOGY:  Dg Chest Port 1 View  06/14/2015   CLINICAL DATA:  Shortness of breath.  EXAM: PORTABLE CHEST - 1 VIEW  COMPARISON:  06/12/2015 chest radiograph.  FINDINGS: Left subclavian central venous catheter terminates in the middle third of the superior vena cava. Stable cardiomediastinal silhouette with top-normal heart size. No pneumothorax. No pleural effusion. Stable mild biapical pleural-parenchymal scarring. Stable mild left basilar atelectasis. No new lung opacity. No pulmonary edema.  IMPRESSION: Stable mild left basilar atelectasis. Otherwise no active disease in the chest.   Electronically  Signed   By: Ilona Sorrel M.D.   On: 06/14/2015 09:54   ASSESSMENT AND PLAN:   81 year old male with recurrent diverticulitis status post Hartmann procedure.  1. Rapid a.fib: now Heart rate is better controlled. Cardiology seen and  following. Elquis is restarted. Pharmacy is assisting with this. Patient will continue metoprolol for rate control and amiodarone for now. Once patient is eating amiodarone can be changed to oral.  2. Uncontrolled hypertension: Patient's blood pressure has much improved. Continue metoprolol, HCTZ and hydralazine.  3. Glaucoma,: Continue atenolol and XALATAN eyedrops  4. Acute renal failure: Improved with IV fluids.  5. Perforated diverticulitis: Patient is status post Hartman's procedure. As per surgery.    Management plans discussed with the patient and family and they are in agreement.  CODE STATUS: FULL  TOTAL TIME TAKING CARE OF THIS PATIENT: 15 minutes.     D/C plans per primary team.   Max Sane M.D on 06/14/2015 at 1:29 PM  Between 7am to 6pm - Pager - [phone] After 6pm go to [email or web address] - password [email or web address] [facility]  [place] [facility]  Office  [phone]  CC:  Primary care physician; No primary care provider on file.

## 2015-06-14 NOTE — Progress Notes (Signed)
Physical Therapy Treatment Patient Details Name: Jesse Macias MRN: [id number] DOB: 06-Sep-1924 Today's Date: 06/14/2015    History of Present Illness presented to ER with acute abdominal/LLQ pain; admitted with diverticulitis with microperforation.  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 Post-op course complicated by transfer to CCU due to afib with RVR (requiring amioderone drip).    PT Comments    Pt demonstrates good range of motion and strength with bed exercises. Pt eager to sit edge of bed requiring Mod A. Pt experiences dizziness that does not resolve sitting edge of bed and becomes fatigued post 5 minutes wishing to lie back down. Pt does show great effort to assist with bed mobility; greater difficulty repositioning in bed primarily due to numerous lines. It is noted that patient tends to hold breath or mouth breathe with exercises/activity occasionally increasing respiratory rate up to 40; pt educated on pursed lip breathing and encouraged to breathe in this manner. Continue PT to progress strength, endurance and all functional mobility.   Follow Up Recommendations  SNF     Equipment Recommendations  Rolling walker with 5" wheels    Recommendations for Other Services       Precautions / Restrictions Restrictions Weight Bearing Restrictions: No    Mobility  Bed Mobility Overal bed mobility: Needs Assistance Bed Mobility: Supine to Sit     Supine to sit: Mod assist     General bed mobility comments: Dependent for repositioning in bed  Transfers                 General transfer comment: unable due to dizziness with sit that does not resolve  Ambulation/Gait                 Stairs            Wheelchair Mobility    Modified Rankin (Stroke Patients Only)       Balance   Sitting-balance support: Bilateral upper extremity supported Sitting  balance-Leahy Scale: Good                              Cognition Arousal/Alertness: Awake/alert Behavior During Therapy: WFL for tasks assessed/performed Overall Cognitive Status: Within Functional Limits for tasks assessed                      Exercises General Exercises - Lower Extremity Ankle Circles/Pumps: AROM;Both;20 reps;Supine Quad Sets: Strengthening;Both;20 reps;Supine Gluteal Sets: Strengthening;Both;20 reps;Supine Short Arc Quad: AROM;Both;20 reps;Supine Heel Slides: AROM;Both;20 reps;Supine (resist into extension) Hip ABduction/ADduction: AROM;Both;AAROM;20 reps;Supine Other Exercises Other Exercises: sitting edge of bed x 5 min    General Comments        Pertinent Vitals/Pain Pain Assessment: No/denies pain    Home Living                      Prior Function            PT Goals (current goals can now be found in the care plan section) Progress towards PT goals: Progressing toward goals    Frequency  Min 2X/week    PT Plan Current plan remains appropriate    Co-evaluation             End of Session   Activity Tolerance: Patient limited by fatigue (limited by dizziness)       Time: [id number] PT Time Calculation (min) (ACUTE ONLY): 29  min  Charges:  $Therapeutic Exercise: 8-22 mins $Therapeutic Activity: 8-22 mins                    G Codes:      [name] 06/14/2015, 4:06 PM

## 2015-06-15 ENCOUNTER — Inpatient Hospital Stay: Payer: Medicare Other

## 2015-06-15 LAB — URINALYSIS COMPLETE WITH MICROSCOPIC (ARMC ONLY)
Bilirubin Urine: NEGATIVE
GLUCOSE, UA: 150 mg/dL — AB
Ketones, ur: NEGATIVE mg/dL
LEUKOCYTES UA: NEGATIVE
NITRITE: NEGATIVE
PH: 5 (ref 5.0–8.0)
Protein, ur: 30 mg/dL — AB
SPECIFIC GRAVITY, URINE: 1.021 (ref 1.005–1.030)
SQUAMOUS EPITHELIAL / LPF: NONE SEEN

## 2015-06-15 LAB — CBC WITH DIFFERENTIAL/PLATELET
BASOS PCT: 0 %
Basophils Absolute: 0 10*3/uL (ref 0–0.1)
EOS ABS: 0.5 10*3/uL (ref 0–0.7)
EOS PCT: 2 %
HCT: 31.3 % — ABNORMAL LOW (ref 40.0–52.0)
Hemoglobin: 10.5 g/dL — ABNORMAL LOW (ref 13.0–18.0)
LYMPHS ABS: 0.5 10*3/uL — AB (ref 1.0–3.6)
Lymphocytes Relative: 2 %
MCH: 31.3 pg (ref 26.0–34.0)
MCHC: 33.7 g/dL (ref 32.0–36.0)
MCV: 92.9 fL (ref 80.0–100.0)
MONO ABS: 1.8 10*3/uL — AB (ref 0.2–1.0)
Monocytes Relative: 7 %
NEUTROS PCT: 89 %
Neutro Abs: 23 10*3/uL — ABNORMAL HIGH (ref 1.4–6.5)
PLATELETS: 300 10*3/uL (ref 150–440)
RBC: 3.37 MIL/uL — ABNORMAL LOW (ref 4.40–5.90)
RDW: 15.9 % — AB (ref 11.5–14.5)
WBC: 25.8 10*3/uL — AB (ref 3.8–10.6)

## 2015-06-15 LAB — BASIC METABOLIC PANEL
Anion gap: 7 (ref 5–15)
BUN: 44 mg/dL — AB (ref 6–20)
CALCIUM: 8.5 mg/dL — AB (ref 8.9–10.3)
CHLORIDE: 96 mmol/L — AB (ref 101–111)
CO2: 25 mmol/L (ref 22–32)
CREATININE: 1.47 mg/dL — AB (ref 0.61–1.24)
GFR calc non Af Amer: 40 mL/min — ABNORMAL LOW (ref >60)
GFR, EST AFRICAN AMERICAN: 47 mL/min — AB (ref >60)
GLUCOSE: 164 mg/dL — AB (ref 65–99)
Potassium: 3.4 mmol/L — ABNORMAL LOW (ref 3.5–5.1)
Sodium: 128 mmol/L — ABNORMAL LOW (ref 135–145)

## 2015-06-15 LAB — MAGNESIUM: MAGNESIUM: 1.8 mg/dL (ref 1.7–2.4)

## 2015-06-15 LAB — PROTEIN / CREATININE RATIO, URINE
CREATININE, URINE: 62 mg/dL
Protein Creatinine Ratio: 1.23 mg/mg{Cre} — ABNORMAL HIGH (ref 0.00–0.15)
Total Protein, Urine: 76 mg/dL

## 2015-06-15 LAB — GLUCOSE, CAPILLARY
GLUCOSE-CAPILLARY: 165 mg/dL — AB (ref 65–99)
Glucose-Capillary: 168 mg/dL — ABNORMAL HIGH (ref 65–99)
Glucose-Capillary: 187 mg/dL — ABNORMAL HIGH (ref 65–99)

## 2015-06-15 LAB — PHOSPHORUS: Phosphorus: 2.8 mg/dL (ref 2.5–4.6)

## 2015-06-15 LAB — TSH: TSH: 4.223 u[IU]/mL (ref 0.350–4.500)

## 2015-06-15 LAB — SODIUM, URINE, RANDOM: Sodium, Ur: 21 mmol/L

## 2015-06-15 MED ORDER — SODIUM CHLORIDE 0.9 % IV SOLN
INTRAVENOUS | Status: DC
  Administered 2015-06-15 – 2015-06-22 (×5): via INTRAVENOUS

## 2015-06-15 MED ORDER — CALCIUM CARBONATE ANTACID 500 MG PO CHEW
1.0000 | CHEWABLE_TABLET | Freq: Three times a day (TID) | ORAL | Status: DC | PRN
  Administered 2015-06-15 – 2015-06-16 (×2): 200 mg via ORAL
  Filled 2015-06-15 (×2): qty 1

## 2015-06-15 MED ORDER — TRACE MINERALS CR-CU-MN-SE-ZN 10-1000-500-60 MCG/ML IV SOLN
INTRAVENOUS | Status: DC
  Administered 2015-06-15: 18:00:00 via INTRAVENOUS
  Filled 2015-06-15: qty 1800

## 2015-06-15 MED ORDER — ALUM & MAG HYDROXIDE-SIMETH 200-200-20 MG/5ML PO SUSP
30.0000 mL | Freq: Four times a day (QID) | ORAL | Status: DC | PRN
  Administered 2015-06-15: 30 mL via ORAL
  Filled 2015-06-15: qty 30

## 2015-06-15 MED ORDER — POTASSIUM CHLORIDE 10 MEQ/100ML IV SOLN
10.0000 meq | INTRAVENOUS | Status: AC
  Administered 2015-06-15 (×4): 10 meq via INTRAVENOUS
  Filled 2015-06-15 (×4): qty 100

## 2015-06-15 NOTE — Progress Notes (Signed)
Surgery progress note  S: No acute issues.  Weak with PT O:Blood pressure 152/80, pulse 83, temperature 98.7 F (37.1 C), temperature source Oral, resp. rate 26, height 5\' 5"  (1.651 m), weight 159 lb (72.122 kg), SpO2 100 %. GEN: NAD/A&Ox3 ABD: soft, min tender, nondistended, incision c/d/i, ostomy pink with some stool  WBC 25.8  A/P 81 yo s/p hartmanns, less pain.   - PT important - Npo for now - TPN

## 2015-06-15 NOTE — Progress Notes (Signed)
Paged Dr. Rexene Edison. Patient complaining of bad heart burn and hiccups that won't stop. Acknowledged by MD and he will put in orders.

## 2015-06-15 NOTE — Progress Notes (Signed)
[facility] [place] NAME: Jesse Macias    MR#:  [id number]  DATE OF BIRTH:  05-Dec-1923  SUBJECTIVE:  Sodium worsening, leukocytosis worsening.  Wife at bedside.  Looks somewhat lethargic REVIEW OF SYSTEMS:    Review of Systems  Constitutional: Negative for fever, chills and malaise/fatigue.  HENT: Negative for sore throat.   Eyes: Negative for blurred vision.  Respiratory: Negative for cough, hemoptysis, shortness of breath and wheezing.   Cardiovascular: Negative for chest pain, palpitations and leg swelling.  Gastrointestinal: Negative for nausea, vomiting, abdominal pain, diarrhea and blood in stool.  Genitourinary: Negative for dysuria.  Musculoskeletal: Negative for back pain.  Neurological: Negative for dizziness, tremors and headaches.  Endo/Heme/Allergies: Does not bruise/bleed easily.    Tolerating Diet: On TPN DRUG ALLERGIES:  No Known Allergies VITALS:  Blood pressure 142/68, pulse 75, temperature 97.6 F (36.4 C), temperature source Oral, resp. rate 20, height 5\' 5"  (1.651 m), weight 72.122 kg (159 lb), SpO2 98 %. PHYSICAL EXAMINATION:   Physical Exam  Constitutional: He is well-developed, well-nourished, and in no distress. No distress.  HENT:  Head: Normocephalic.  Eyes: No scleral icterus.  Neck: Normal range of motion. Neck supple. No JVD present. No tracheal deviation present.  Cardiovascular: Normal rate, regular rhythm and normal heart sounds.  Exam reveals no gallop and no friction rub.   No murmur heard. Pulmonary/Chest: Effort normal and breath sounds normal. No respiratory distress. He has no wheezes. He has no rales. He exhibits no tenderness.  Abdominal: Soft. He exhibits no distension and no mass. There is tenderness. There is no rebound and no guarding.  Penrose drain  Musculoskeletal: Normal range of motion. He exhibits no edema.  Neurological:  Lethargic  Skin: Skin is warm. No rash noted.  No erythema.  Psychiatric: Affect and judgment normal.   LABORATORY PANEL:   CBC  Recent Labs Lab 06/15/15 0441  WBC 25.8*  HGB 10.5*  HCT 31.3*  PLT 300   ------------------------------------------------------------------------------------------------------------------  Chemistries   Recent Labs Lab 06/10/15 0552  06/15/15 0441  NA 136  < > 128*  K 3.5  < > 3.4*  CL 101  < > 96*  CO2 28  < > 25  GLUCOSE 199*  < > 164*  BUN 28*  < > 44*  CREATININE 1.42*  < > 1.47*  CALCIUM 8.9  < > 8.5*  MG 1.5*  < > 1.8  AST 23  --   --   ALT 14*  --   --   ALKPHOS 46  --   --   BILITOT 1.5*  --   --   < > = values in this interval not displayed. ------------------------------------------------------------------------------------------------------------------  Cardiac Enzymes No results for input(s): TROPONINI in the last 168 hours. ------------------------------------------------------------------------------------------------------------------  RADIOLOGY:  Dg Chest Port 1 View  06/14/2015   CLINICAL DATA:  Shortness of breath.  EXAM: PORTABLE CHEST - 1 VIEW  COMPARISON:  06/12/2015 chest radiograph.  FINDINGS: Left subclavian central venous catheter terminates in the middle third of the superior vena cava. Stable cardiomediastinal silhouette with top-normal heart size. No pneumothorax. No pleural effusion. Stable mild biapical pleural-parenchymal scarring. Stable mild left basilar atelectasis. No new lung opacity. No pulmonary edema.  IMPRESSION: Stable mild left basilar atelectasis. Otherwise no active disease in the chest.   Electronically Signed   By: Ilona Sorrel M.D.   On: 06/14/2015 09:54   ASSESSMENT AND PLAN:  81 year old male with recurrent  diverticulitis status post Hartmann procedure.  1. Rapid a.fib: now Heart rate is better controlled. Cardiology seen and following. Elquis is restarted. Pharmacy is assisting with this. continue metoprolol for rate control and  amiodarone for now. Once patient is eating amiodarone can be changed to oral.  2. Uncontrolled hypertension: Patient's blood pressure has much improved. Continue metoprolol, HCTZ and hydralazine.  3. Hyponatremia: Now down to 128, consult nephrology for fluid and electrolyte management  4. Acute renal failure: Improved with IV fluids.  Nephrology consultation  5. Perforated diverticulitis: Patient is status post Hartman's procedure. As per surgery.  6.  Worsening leukocytosis: We will consult infectious disease, monitor white count, patient is afebrile  7.  Chest congestion: somewhat fluid overload, stop IV fluids, he is on TPN,Lasix as needed, caution due to renal failure, Chest x-ray looked okay with no acute pathology   8. Glaucoma,: Continue atenolol and XALATAN eyedrops   Management plans discussed with the patient, Dr. Rexene Edison , family and they are in agreement.  CODE STATUS: FULL  TOTAL TIME TAKING CARE OF THIS PATIENT: 15 minutes.     D/C plans per primary team.   Max Sane M.D on 06/15/2015 at 1:34 PM  Between 7am to 6pm - Pager - [phone] After 6pm go to [email or web address] - password [email or web address]  [facility]  Office  [phone]  CC:  Primary care physician; No primary care provider on file.

## 2015-06-15 NOTE — Progress Notes (Signed)
Pt's lungs still sounding coarse with wheezing,  Respiratory called to assess pt.  Neb treatment given, pt reported some improvement. Still able to cough up clear/thick sputum. Will continue to monitor since several fluids have been taken down, NS/fat emulsion. Jesse Macias

## 2015-06-15 NOTE — Progress Notes (Signed)
PARENTERAL NUTRITION CONSULT NOTE - Follow up  Pharmacy Consult for Electrolyte/Glucose Management Indication: TPN  No Known Allergies  Patient Measurements: Height: 5\' 5"  (165.1 cm) Weight: 159 lb (72.122 kg) IBW/kg (Calculated) : 61.5 Adjusted Body Weight: na  Usual Weight: na  Vital Signs: Temp: 98 F (36.7 C) (09/02 0440) Temp Source: Oral (09/02 0440) BP: 168/74 mmHg (09/02 0440) Pulse Rate: 85 (09/02 0440) Intake/Output from previous day: 09/01 0701 - 09/02 0700 In: 436.8 [I.V.:132.8; IV Piggyback:304] Out: 950 [Urine:950] Intake/Output from this shift:    Labs:  Recent Labs  06/13/15 0439 06/14/15 0506 06/15/15 0441  WBC 24.7* 19.9* 25.8*  HGB 11.4* 10.2* 10.5*  HCT 33.1* 30.2* 31.3*  PLT 256 271 300     Recent Labs  06/13/15 0439 06/13/15 1500 06/14/15 0506 06/15/15 0441  NA 132* 135 130* 128*  K 3.4* 2.8* 4.1 3.4*  CL 97* 109 98* 96*  CO2 28 21* 26 25  GLUCOSE 144* 106* 134* 164*  BUN 37* 33* 43* 44*  CREATININE 1.47* 1.10 1.62* 1.47*  CALCIUM 8.6* 6.8* 8.6* 8.5*  MG 1.5*  --  2.2 1.8  PHOS 3.3  --  2.4* 2.8   Estimated Creatinine Clearance: 29.6 mL/min (by C-G formula based on Cr of 1.47).    Recent Labs  06/14/15 1609 06/14/15 2349 06/15/15 0442  GLUCAP 131* 148* 168*    Medical History: Past Medical History  Diagnosis Date  . Hypertension   . History of hiatal hernia     Medications:  Infusions:  . amiodarone 30 mg/hr (06/14/15 2359)  . Marland KitchenTPN (CLINIMIX-E) Adult 75 mL/hr at 06/14/15 1900   And  . fat emulsion Stopped (06/15/15 0432)    Assessment: 8/31 am labs: K 3.3, Mg 1.5, phos 3.3.  8/31 Labs at 1500:  K 2.8   Plan:  Ordered KCl 10 mEq IV x 4 and magnesium 4 gm IV x 1, will recheck potassium this afternoon and all electrolytes tomorrow morning.   8/31 at 1650:  Dr. Ashok Cordia has ordered KCL 58meq IV x 1 replacement and Magnesium 2 gram IV x 1. Follow labs in am.  0901 AM: Na 130, K 4.1, Phos 2.4, Mg 2.2. Ordered  sodium phosphate 12 mmol IV x 1. Will follow up electrolytes tomorrow morning.   0902 AM: Na 128, K 3.4, Phos 2.8, Mg 1.8. Renal function appears to be at baseline. Ordered KCl 10 mEq IV x 4 doses and will follow up tomorrow morning.  Shelvie Salsberry A. Jordan Hawks, PharmD Clinical Pharmacist 06/15/2015 6:22 AM

## 2015-06-15 NOTE — Consult Note (Signed)
[facility] Infectious Disease     Reason for Consult:Leukocytosis     Referring Physician: Max Sane Date of Admission:  06/02/2015   Active Problems:   Acute diverticulitis   Perforated diverticulum of large intestine   Ileus   HPI: Jesse Macias is a 81 y.o. male admitted 8.20 with LLQ abd pain, nv.  CT showed diverticulitis. He underwent Hartmans surgery on 8/27 for perforation. No abscess was found. He had been on zosyn from admission and has remained on it however he has persistent leukocytosis but no fevers.  Last CT done 8/26 preop. He is on TPN through a L chest central line. He has a foley cath in. He reports feeling weak but no vomiting, min cough, is on O2 currently but not at home. He has some consistent abd pain but not really progressing. No skin lesions.    Past Medical History  Diagnosis Date  . Hypertension   . History of hiatal hernia    Past Surgical History  Procedure Laterality Date  . Hernia repair      Two  . Back surgery    . Colectomy with colostomy creation/hartmann procedure N/A 06/09/2015    Procedure: COLECTOMY WITH COLOSTOMY CREATION/HARTMANN PROCEDURE;  Surgeon: Florene Glen, MD;  Location: [facility] ORS;  Service: General;  Laterality: N/A;  . Central venous catheter insertion N/A 06/09/2015    Procedure: INSERTION CENTRAL LINE ADULT;  Surgeon: Florene Glen, MD;  Location: [facility] ORS;  Service: General;  Laterality: N/A;   Social History  Substance Use Topics  . Smoking status: Never Smoker   . Smokeless tobacco: None  . Alcohol Use: None   History reviewed. No pertinent family history.  Allergies: No Known Allergies  Current antibiotics: Antibiotics Given (last 72 hours)    Date/Time Action Medication Dose Rate   06/12/15 2058 Given   piperacillin-tazobactam (ZOSYN) IVPB 3.375 g 3.375 g 12.5 mL/hr   06/13/15 0354 Given   piperacillin-tazobactam (ZOSYN) IVPB 3.375 g 3.375 g 12.5 mL/hr   06/13/15 1316 Given   piperacillin-tazobactam  (ZOSYN) IVPB 3.375 g 3.375 g 12.5 mL/hr   06/13/15 2123 Given   piperacillin-tazobactam (ZOSYN) IVPB 3.375 g 3.375 g 12.5 mL/hr   06/14/15 0516 Given   piperacillin-tazobactam (ZOSYN) IVPB 3.375 g 3.375 g 12.5 mL/hr   06/14/15 1137 Given   piperacillin-tazobactam (ZOSYN) IVPB 3.375 g 3.375 g 12.5 mL/hr   06/14/15 2103 Given   piperacillin-tazobactam (ZOSYN) IVPB 3.375 g 3.375 g 12.5 mL/hr   06/15/15 0310 Given   piperacillin-tazobactam (ZOSYN) IVPB 3.375 g 3.375 g 12.5 mL/hr   06/15/15 1203 Given   piperacillin-tazobactam (ZOSYN) IVPB 3.375 g 3.375 g 12.5 mL/hr      MEDICATIONS: . apixaban  2.5 mg Oral BID  . brimonidine  1 drop Both Eyes BID  . dorzolamide  1 drop Both Eyes BID  . hydrALAZINE  25 mg Oral 3 times per day  . hypromellose   Both Eyes QHS  . insulin aspart  0-9 Units Subcutaneous 4 times per day  . latanoprost  1 drop Both Eyes QHS  . metoprolol tartrate  25 mg Oral BID  . piperacillin-tazobactam (ZOSYN)  IV  3.375 g Intravenous 3 times per day  . timolol  1 drop Both Eyes BID    Review of Systems - 11 systems reviewed and negative per HPI   OBJECTIVE: Temp:  [97.6 F (36.4 C)-98.7 F (37.1 C)] 97.6 F (36.4 C) (09/02 1152) Pulse Rate:  [75-85] 75 (09/02 1152) Resp:  [  12-26] 20 (09/02 1152) BP: (125-170)/(57-80) 142/68 mmHg (09/02 1152) SpO2:  [96 %-100 %] 98 % (09/02 1152) Weight:  [72.122 kg (159 lb)] 72.122 kg (159 lb) (09/02 0440) Physical Exam  Constitutional: very frail, chronically ill appearing HENT:  Mouth/Throat: Oropharynx is clear and dry . No oropharyngeal exudate.  Cardiovascular: Normal rate, regular rhythm and normal heart sounds.  Pulmonary/Chest: Effort normal bil rhonchi  Abdominal: Soft. abd is distended, he has midline incision with staples in place but relatively dry and only some ss drainage from around the penrose drains. Mild diffuse ttp Lymphadenopathy: He has no cervical adenopathy.  Neurological: He is alert and oriented to  person, place, and time.  Skin: Skin is warm and dry. Mult ecchymosis Access - L chest CVC FOley cath   LABS: Results for orders placed or performed during the hospital encounter of 06/02/15 (from the past 48 hour(s))  Glucose, capillary     Status: Abnormal   Collection Time: 06/13/15  7:51 PM  Result Value Ref Range   Glucose-Capillary 139 (H) 65 - 99 mg/dL   Comment 1 Notify RN   Glucose, capillary     Status: Abnormal   Collection Time: 06/13/15 11:51 PM  Result Value Ref Range   Glucose-Capillary 157 (H) 65 - 99 mg/dL   Comment 1 Notify RN   Glucose, capillary     Status: Abnormal   Collection Time: 06/14/15  4:10 AM  Result Value Ref Range   Glucose-Capillary 167 (H) 65 - 99 mg/dL  CBC with Differential/Platelet     Status: Abnormal   Collection Time: 06/14/15  5:06 AM  Result Value Ref Range   WBC 19.9 (H) 3.8 - 10.6 K/uL   RBC 3.26 (L) 4.40 - 5.90 MIL/uL   Hemoglobin 10.2 (L) 13.0 - 18.0 g/dL   HCT 30.2 (L) 40.0 - 52.0 %   MCV 92.8 80.0 - 100.0 fL   MCH 31.2 26.0 - 34.0 pg   MCHC 33.6 32.0 - 36.0 g/dL   RDW 15.8 (H) 11.5 - 14.5 %   Platelets 271 150 - 440 K/uL   Neutrophils Relative % 79 (H) 43 - 77 %   Lymphocytes Relative 5 (L) 12 - 46 %   Monocytes Relative 11 3 - 12 %   Eosinophils Relative 3 0 - 5 %   Basophils Relative 0 0 - 1 %   Band Neutrophils 1 0 - 10 %   Metamyelocytes Relative 0 %   Myelocytes 1 %   Promyelocytes Absolute 0 %   Blasts 0 %   nRBC 0 0 /100 WBC   Other 0 %   Neutro Abs 16.1 (H) 1.7 - 7.7 K/uL   Lymphs Abs 1.0 0.7 - 4.0 K/uL   Monocytes Absolute 2.2 (H) 0.1 - 1.0 K/uL   Eosinophils Absolute 0.6 0.0 - 0.7 K/uL   Basophils Absolute 0.0 0.0 - 0.1 K/uL   RBC Morphology MIXED RBC POPULATION    Smear Review PLATELETS APPEAR ADEQUATE   Magnesium     Status: None   Collection Time: 06/14/15  5:06 AM  Result Value Ref Range   Magnesium 2.2 1.7 - 2.4 mg/dL  Phosphorus     Status: Abnormal   Collection Time: 06/14/15  5:06 AM  Result  Value Ref Range   Phosphorus 2.4 (L) 2.5 - 4.6 mg/dL  Basic metabolic panel     Status: Abnormal   Collection Time: 06/14/15  5:06 AM  Result Value Ref Range   Sodium 130 (  L) 135 - 145 mmol/L   Potassium 4.1 3.5 - 5.1 mmol/L   Chloride 98 (L) 101 - 111 mmol/L   CO2 26 22 - 32 mmol/L   Glucose, Bld 134 (H) 65 - 99 mg/dL   BUN 43 (H) 6 - 20 mg/dL   Creatinine, Ser 1.62 (H) 0.61 - 1.24 mg/dL   Calcium 8.6 (L) 8.9 - 10.3 mg/dL   GFR calc non Af Amer 36 (L) >60 mL/min   GFR calc Af Amer 42 (L) >60 mL/min    Comment: (NOTE) The eGFR has been calculated using the CKD EPI equation. This calculation has not been validated in all clinical situations. eGFR's persistently <60 mL/min signify possible Chronic Kidney Disease.    Anion gap 6 5 - 15  Glucose, capillary     Status: Abnormal   Collection Time: 06/14/15  7:20 AM  Result Value Ref Range   Glucose-Capillary 153 (H) 65 - 99 mg/dL   Comment 1 Notify RN   Glucose, capillary     Status: Abnormal   Collection Time: 06/14/15 10:56 AM  Result Value Ref Range   Glucose-Capillary 169 (H) 65 - 99 mg/dL   Comment 1 Notify RN   Glucose, capillary     Status: Abnormal   Collection Time: 06/14/15  4:09 PM  Result Value Ref Range   Glucose-Capillary 131 (H) 65 - 99 mg/dL   Comment 1 Notify RN   Glucose, capillary     Status: Abnormal   Collection Time: 06/14/15 11:49 PM  Result Value Ref Range   Glucose-Capillary 148 (H) 65 - 99 mg/dL   Comment 1 Notify RN   CBC with Differential/Platelet     Status: Abnormal   Collection Time: 06/15/15  4:41 AM  Result Value Ref Range   WBC 25.8 (H) 3.8 - 10.6 K/uL   RBC 3.37 (L) 4.40 - 5.90 MIL/uL   Hemoglobin 10.5 (L) 13.0 - 18.0 g/dL   HCT 31.3 (L) 40.0 - 52.0 %   MCV 92.9 80.0 - 100.0 fL   MCH 31.3 26.0 - 34.0 pg   MCHC 33.7 32.0 - 36.0 g/dL   RDW 15.9 (H) 11.5 - 14.5 %   Platelets 300 150 - 440 K/uL   Neutrophils Relative % 89 %   Lymphocytes Relative 2 %   Monocytes Relative 7 %    Eosinophils Relative 2 %   Basophils Relative 0 %   Neutro Abs 23.0 (H) 1.4 - 6.5 K/uL   Lymphs Abs 0.5 (L) 1.0 - 3.6 K/uL   Monocytes Absolute 1.8 (H) 0.2 - 1.0 K/uL   Eosinophils Absolute 0.5 0 - 0.7 K/uL   Basophils Absolute 0.0 0 - 0.1 K/uL   Smear Review MORPHOLOGY UNREMARKABLE     Comment: PLATELETS APPEAR ADEQUATE  Magnesium     Status: None   Collection Time: 06/15/15  4:41 AM  Result Value Ref Range   Magnesium 1.8 1.7 - 2.4 mg/dL  Phosphorus     Status: None   Collection Time: 06/15/15  4:41 AM  Result Value Ref Range   Phosphorus 2.8 2.5 - 4.6 mg/dL  Basic metabolic panel     Status: Abnormal   Collection Time: 06/15/15  4:41 AM  Result Value Ref Range   Sodium 128 (L) 135 - 145 mmol/L   Potassium 3.4 (L) 3.5 - 5.1 mmol/L   Chloride 96 (L) 101 - 111 mmol/L   CO2 25 22 - 32 mmol/L   Glucose, Bld 164 (H) 65 -  99 mg/dL   BUN 44 (H) 6 - 20 mg/dL   Creatinine, Ser 1.47 (H) 0.61 - 1.24 mg/dL   Calcium 8.5 (L) 8.9 - 10.3 mg/dL   GFR calc non Af Amer 40 (L) >60 mL/min   GFR calc Af Amer 47 (L) >60 mL/min    Comment: (NOTE) The eGFR has been calculated using the CKD EPI equation. This calculation has not been validated in all clinical situations. eGFR's persistently <60 mL/min signify possible Chronic Kidney Disease.    Anion gap 7 5 - 15  Glucose, capillary     Status: Abnormal   Collection Time: 06/15/15  4:42 AM  Result Value Ref Range   Glucose-Capillary 168 (H) 65 - 99 mg/dL   Comment 1 Notify RN   Glucose, capillary     Status: Abnormal   Collection Time: 06/15/15 11:51 AM  Result Value Ref Range   Glucose-Capillary 165 (H) 65 - 99 mg/dL   Comment 1 Notify RN    No components found for: ESR, C REACTIVE PROTEIN MICRO: Recent Results (from the past 720 hour(s))  MRSA PCR Screening     Status: None   Collection Time: 06/08/15 11:36 PM  Result Value Ref Range Status   MRSA by PCR NEGATIVE NEGATIVE Final    Comment:        The GeneXpert MRSA Assay  (FDA approved for NASAL specimens only), is one component of a comprehensive MRSA colonization surveillance program. It is not intended to diagnose MRSA infection nor to guide or monitor treatment for MRSA infections.     IMAGING: Ct Abdomen Pelvis Wo Contrast  06/02/2015   CLINICAL DATA:  Left lower quadrant pain  EXAM: CT ABDOMEN AND PELVIS WITHOUT CONTRAST  TECHNIQUE: Multidetector CT imaging of the abdomen and pelvis was performed following the standard protocol without IV contrast.  COMPARISON:  09/28/2008  FINDINGS: BODY WALL: Status post right inguinal hernia repair.  LOWER CHEST:  Gastroesophageal reflux with small hiatal hernia and prominent circumferential lower esophageal thickening. No surrounding adenopathy.  Cluster of nodules in the right middle lobe appear postinflammatory.  ABDOMEN/PELVIS:  Liver: Incidental cyst in the caudate lobe. No significant findings.  Biliary: No evidence of biliary obstruction or stone.  Pancreas: Unremarkable.  Spleen: Unremarkable.  Adrenals: Unremarkable.  Kidneys and ureters: No hydronephrosis or stone. Smooth bilateral renal atrophy. 22 mm cyst in the upper pole right kidney.  Bladder: Bladder wall thickening with cellules, chronic outlet obstruction.  Reproductive: Symmetric moderate enlargement of the prostate.  Bowel: No obstruction. No appendicitis.Distal colonic diverticulosis with focal active inflammation at the descending sigmoid junction where there is extraluminal gas. Small bubbles of pneumoperitoneum present in the left abdomen. No indication of abscess.  Retroperitoneum: No mass or adenopathy.  Vascular: No acute abnormality.  OSSEOUS: No acute abnormalities. Calcification ventral to the left hip could be soft tissue or bursal.  Critical Value/emergent results were called by telephone at the time of interpretation on 06/02/2015 at 11:01 pm to Dr. Lavonia Drafts , who verbally acknowledged these results.  IMPRESSION: 1. Perforated diverticulitis  at the sigmoid descending junction with small pneumoperitoneum. No fluid collection. 2. Marked thickening of the distal esophagus with small hiatal hernia. This could reflect esophagitis or neoplasm. 3. Chronic bladder outlet obstruction.   Electronically Signed   By: Monte Fantasia M.D.   On: 06/02/2015 23:02   Dg Chest 1 View  06/12/2015   CLINICAL DATA:  Shortness of breath  EXAM: CHEST  1 VIEW  COMPARISON:  06/09/2015  FINDINGS: Left central line tip is in the SVC. NG tube enters the stomach. Mild cardiomegaly. No confluent airspace opacities or effusions. No acute bony abnormality.  IMPRESSION: Mild cardiomegaly.  No active disease.   Electronically Signed   By: Rolm Baptise M.D.   On: 06/12/2015 12:55   X-ray Chest Pa Or Ap  06/09/2015   CLINICAL DATA:  Central line placement.  EXAM: CHEST  1 VIEW  COMPARISON:  06/05/2015  FINDINGS: New left subclavian central venous line has its tip in the mid superior vena cava. No pneumothorax.  Mild medial right lung base atelectasis. No lung consolidation or edema.  Orogastric tube is stable passing below the diaphragm into the stomach.  IMPRESSION: 1. Left subclavian central venous line tip lies in the mid superior vena cava. No pneumothorax. No other change from the prior exam. No acute findings in the lungs.   Electronically Signed   By: Lajean Manes M.D.   On: 06/09/2015 11:55   Dg Abd 1 View  06/06/2015   CLINICAL DATA:  Ileus  EXAM: ABDOMEN - 1 VIEW  COMPARISON:  06/05/2015  FINDINGS: Persistent small bowel dilatation is noted although some mild decrease in the small bowel diameter is noted. Contrast material is again seen throughout colon. No free air is seen. Mild degenerative changes of the lumbar spine are noted.  IMPRESSION: Slight improvement in the degree of small bowel dilatation   Electronically Signed   By: Inez Catalina M.D.   On: 06/06/2015 12:06   Dg Abd 1 View  06/05/2015   CLINICAL DATA:  Abdominal pain, NG tube placement.  EXAM: ABDOMEN  - 1 VIEW  COMPARISON:  CT 06/02/2015  FINDINGS: Nasogastric tube is present with tip over the stomach in the left upper quadrant and side-port in the region of the gastroesophageal junction. This could be advanced another 6 cm.  Examination demonstrates air and contrast throughout the colon. There are several air-filled dilated small bowel loops in the central abdomen measuring up to 4.3 cm in diameter likely secondary ileus due to patient's known acute sigmoid diverticulitis with perforation. There mild degenerate changes of the spine and hips. Pelvic phleboliths are present.  IMPRESSION: Centralized air-filled dilated small bowel loops likely secondary ileus to patient's known acute diverticulitis with perforation.  Nasogastric tube with tip over the gastric fundus in the left upper quadrant and side-port in the region of the gastroesophageal junction. This could be advanced another 6 cm.   Electronically Signed   By: Marin Olp M.D.   On: 06/05/2015 10:03   Ct Abdomen Pelvis W Contrast  06/08/2015   CLINICAL DATA:  History of diverticulitis. AP are onset severe worsening left lower quadrant pain. Nausea, emesis. Unable to drink contrast.  EXAM: CT ABDOMEN AND PELVIS WITH CONTRAST  TECHNIQUE: Multidetector CT imaging of the abdomen and pelvis was performed using the standard protocol following bolus administration of intravenous contrast.  CONTRAST:  100 cc Omnipaque 300  COMPARISON:  06/02/2015  FINDINGS: Lower chest: Coronary artery calcifications are present. Heart size is normal. The lung bases are unremarkable.  Upper abdomen: The gallbladder is present. Within the caudate lobe there is a cyst measuring 1.6 cm. No suspicious liver lesions. No focal abnormality identified within the spleen, pancreas, or adrenal glands. The gallbladder is present.  Gastrointestinal tract: The stomach has a normal appearance. There is diffuse dilatation of small bowel loops with transition zone best localized to the left  lower quadrant. Findings are consistent with ileus.  Contrast reaches the sigmoid and rectum. The distal small bowel loops are normal in caliber.  Within the left lower quadrant there is a small air-fluid collection which measures 5.1 x 3.2 cm. This is adjacent to a segment of sigmoid colon contain numerous diverticula and thickened wall. Findings are consistent with perforated segment of sigmoid colon related to diverticulitis. There is likely secondary inflammation of small bowel loops accounting for the functional obstruction.  Pelvis: Urinary bladder contains Foley catheter. The bladder wall appears slightly thickened and may be secondarily inflamed. There is a small amount of free pelvic fluid. Prostate gland appears slightly prominent in size. Seminal vesicles have a normal appearance.  Retroperitoneum: There is atherosclerosis of the abdominal aorta. No aneurysm.  Abdominal wall: Unremarkable.  Osseous structures: Schmorl's nodes and mild degenerative changes in the spine. No suspicious lytic or blastic lesions are identified.  IMPRESSION: 1. Perforation in the left lower quadrant related to diverticulitis. 2. Secondary inflammation of left lower quadrant small bowel loops creating ileus of small bowel loops. No obstruction. 3. Thickened bladder wall, likely secondarily inflamed. 4. Coronary artery disease. 5. Small liver cyst. 6. Abdominal aortic atherosclerosis. 7. Prostatic enlargement. 8. Critical Value/emergent results were called by telephone at the time of interpretation on 06/08/2015 at 3:07 pm to Dr. Phoebe Perch , who verbally acknowledged these results.   Electronically Signed   By: Nolon Nations M.D.   On: 06/08/2015 15:07   Dg Chest Port 1 View  06/14/2015   CLINICAL DATA:  Shortness of breath.  EXAM: PORTABLE CHEST - 1 VIEW  COMPARISON:  06/12/2015 chest radiograph.  FINDINGS: Left subclavian central venous catheter terminates in the middle third of the superior vena cava. Stable  cardiomediastinal silhouette with top-normal heart size. No pneumothorax. No pleural effusion. Stable mild biapical pleural-parenchymal scarring. Stable mild left basilar atelectasis. No new lung opacity. No pulmonary edema.  IMPRESSION: Stable mild left basilar atelectasis. Otherwise no active disease in the chest.   Electronically Signed   By: Ilona Sorrel M.D.   On: 06/14/2015 09:54   Dg Chest Port 1 View  06/05/2015   CLINICAL DATA:  NG tube placement  EXAM: PORTABLE CHEST - 1 VIEW  COMPARISON:  None.  FINDINGS: NG tube is seen entering the stomach with the tip in the fundus. Mild cardiomegaly. No confluent airspace opacities or effusions. No acute bony abnormality.  IMPRESSION: No active disease.   Electronically Signed   By: Rolm Baptise M.D.   On: 06/05/2015 10:04   Dg Abd 2 Views  06/15/2015   CLINICAL DATA:  Inpatient. Recent surgery. Status post Hartmann's for diverticulitis. Reflux.  EXAM: ABDOMEN - 2 VIEW  COMPARISON:  CT of the abdomen and pelvis 06/08/2015 and 06/06/2015  FINDINGS: Dilated small bowel loops are identified throughout the central abdomen. There is paucity of large bowel gas. Surgical clips overlie the lower central pelvis. Left lower quadrant ostomy. No free intraperitoneal air.  IMPRESSION: 1. Postoperative changes. 2. No evidence for free intraperitoneal air. 3. Persistent small bowel dilatation consistent with ileus or obstruction.   Electronically Signed   By: Nolon Nations M.D.   On: 06/15/2015 14:06    Assessment:   Jesse Macias is a 81 y.o. male with persistent leukocytosis in a patient now s/p Hartmans for perforated diverticuli performed 8/27.  He has been on zosyn since admit. He has no fevers nor HD instability. He had cxr done 9/1 with only atelectasis, abd xray 9/2 with sb dilatation but no free air.  He is on TPN through CVC, and has a foley in place Possible sources of the increased wbc include intrabd abscess, wound infection, line infection  (bacteremia/candidemia since on TPN)., UTI with foley in place, C diff.  Recommendations Cont zosyn Check BCX, UA UCX If worsens would check CT abd pelvis to eval for abd fluid collection post op. If stool output increases would check C diff. Thank you very much for allowing me to participate in the care of this patient. Please call with questions.   Cheral Marker. Ola Spurr, MD

## 2015-06-15 NOTE — Progress Notes (Addendum)
MD, Dr. Jannifer Franklin notified. Pt's lung sound "wet."  Pt is coughing and able to cough up phlegm. Pt has a lot of different fluids running; tpn, ns, fat emulsion, iv abx.  Requesting to stop NS.  MD ordered to stop saline, continue to monitor.  Do not want to give lasix at this time due to BUN/Crea.  Will continue to monitor. Jesse Macias

## 2015-06-15 NOTE — Care Management CHF Note (Signed)
CM spoke with patient and his wife approx 11:30 AM. PCA for pain, IVF and Fats have been discontinued.  Patient's oral mucosa extremely dry.  He is experiencing "terrible heartburn type feeling" and is not able to participate in the converstation.  Informed patient is passing stool through ostomy.  Have attempted to reach out to surgeon to discuss npo status. Nephrology reconsult for acute on chronic renal failure and hyponatremia

## 2015-06-15 NOTE — Progress Notes (Signed)
Paged Dr. Rexene Edison with DG abd results. Per MD, no need for NG tube right now as patient not currently experiencing nausea or vomiting. Patient up in chair once today, will get up again a second time.

## 2015-06-15 NOTE — Consult Note (Signed)
[facility] CONSULT NOTE    Date: 06/15/2015                  Patient Name:  Jesse Macias  MRN: [id number]  DOB: March 23, 1924  Age / Sex: 81 y.o., male         PCP: No primary care provider on file.                 Service Requesting Consult: Internal Medicine/Dr. Max Sane                 Reason for Consult: Recent acute renal failure/ hyponatremia            History of Present Illness: Patient is a 81 y.o. male with a PMHx of hypertension and glaucoma, who was admitted to [facility] on 06/02/2015 for evaluation of left lower quadrant pain.  E was ultimately found to have perforated diverticulitis.  He underwent Hartman's procedure on 06/09/15 for perforation.  Colostomy was placed.  He is currently n.p.o. Status and is receiving TPN. The patient has had fluctuating renal function over the course of the hospitalization.  The patient appears to have chronic kidney disease with a baseline creatinine of 1.28.creatinine has been fluctuating a bit over the past several days and is currently up to 1.47.  He has also developed hyponatremia.  He is receiving TPN that has a very low amount of sodium. He was also on hydrochlorothiazide however this has been discontinued.  The patient's oral mucosa were found to be quite dry.   Medications: Outpatient medications: Prescriptions prior to admission  Medication Sig Dispense Refill Last Dose  . brimonidine (ALPHAGAN) 0.2 % ophthalmic solution Place 2 drops into both eyes daily.   06/02/2015 at Unknown time  . dorzolamide (TRUSOPT) 2 % ophthalmic solution Place 2 drops into both eyes daily.   06/02/2015 at Unknown time  . latanoprost (XALATAN) 0.005 % ophthalmic solution Place 2 drops into both eyes daily.   06/02/2015 at Unknown time  . losartan-hydrochlorothiazide (HYZAAR) 100-12.5 MG per tablet Take 1 tablet by mouth daily.   06/02/2015 at Unknown time  . timolol (TIMOPTIC) 0.25 % ophthalmic solution Place 2 drops into both eyes daily.    06/02/2015 at Unknown time  . TOPROL XL 200 MG 24 hr tablet Take 1 tablet by mouth daily.   06/02/2015 at Unknown time    Current medications: Current Facility-Administered Medications  Medication Dose Route Frequency Provider Last Rate Last Dose  . Marland KitchenTPN (CLINIMIX-E) Adult   Intravenous Continuous TPN Marlyce Huge, MD      . acetaminophen (TYLENOL) suppository 650 mg  650 mg Rectal Q6H PRN Marlyce Huge, MD      . alum & mag hydroxide-simeth (MAALOX/MYLANTA) 200-200-20 MG/5ML suspension 30 mL  30 mL Oral Q6H PRN Marlyce Huge, MD   30 mL at 06/15/15 1331  . amiodarone (NEXTERONE PREMIX) 360 MG/200ML (1.8 mg/mL) IV infusion  30 mg/hr Intravenous Continuous Nicholes Mango, MD 16.7 mL/hr at 06/15/15 1115 30 mg/hr at 06/15/15 1115  . apixaban (ELIQUIS) tablet 2.5 mg  2.5 mg Oral BID Max Sane, MD   2.5 mg at 06/15/15 0744  . brimonidine (ALPHAGAN) 0.2 % ophthalmic solution 1 drop  1 drop Both Eyes BID Marlyce Huge, MD   1 drop at 06/15/15 0746  . calcium carbonate (TUMS - dosed in mg elemental calcium) chewable tablet 200 mg of elemental calcium  1 tablet Oral TID PRN Marlyce Huge, MD   200 mg of  elemental calcium at 06/15/15 1331  . dorzolamide (TRUSOPT) 2 % ophthalmic solution 1 drop  1 drop Both Eyes BID Marlyce Huge, MD   1 drop at 06/15/15 0746  . TPN (CLINIMIX-E) Adult   Intravenous Continuous TPN Florene Glen, MD 75 mL/hr at 06/14/15 1900     And  . fat emulsion 20 % infusion 500 mL  500 mL Intravenous Continuous TPN Florene Glen, MD   Stopped at 06/15/15 [phone]  . hydrALAZINE (APRESOLINE) injection 10 mg  10 mg Intravenous Q6H PRN Max Sane, MD   10 mg at 06/12/15 0406  . hydrALAZINE (APRESOLINE) tablet 25 mg  25 mg Oral 3 times per day Max Sane, MD   25 mg at 06/15/15 1331  . HYDROmorphone (DILAUDID) injection 0.5 mg  0.5 mg Intravenous Q4H PRN Marlyce Huge, MD   0.5 mg at 06/15/15 [id number]  . hypromellose (GENTEAL) 0.3 %  ophthalmic ointment   Both Eyes QHS Clayburn Pert, MD      . insulin aspart (novoLOG) injection 0-9 Units  0-9 Units Subcutaneous 4 times per day Florene Glen, MD   2 Units at 06/15/15 1203  . ipratropium-albuterol (DUONEB) 0.5-2.5 (3) MG/3ML nebulizer solution 3 mL  3 mL Nebulization Q4H PRN Bettey Costa, MD   3 mL at 06/15/15 0423  . latanoprost (XALATAN) 0.005 % ophthalmic solution 1 drop  1 drop Both Eyes QHS Marlyce Huge, MD   1 drop at 06/14/15 2104  . LORazepam (ATIVAN) injection 0.5 mg  0.5 mg Intravenous Q6H PRN Nicholes Mango, MD      . menthol-cetylpyridinium (CEPACOL) lozenge 3 mg  1 lozenge Oral PRN Florene Glen, MD      . metoprolol (LOPRESSOR) injection 5 mg  5 mg Intravenous Q4H PRN Nicholes Mango, MD   5 mg at 06/11/15 0540  . metoprolol tartrate (LOPRESSOR) tablet 25 mg  25 mg Oral BID Yolonda Kida, MD   25 mg at 06/15/15 0744  . ondansetron (ZOFRAN-ODT) disintegrating tablet 4 mg  4 mg Oral Q6H PRN Marlyce Huge, MD       Or  . ondansetron [facility]) injection 4 mg  4 mg Intravenous Q6H PRN Marlyce Huge, MD   4 mg at 06/13/15 1345  . piperacillin-tazobactam (ZOSYN) IVPB 3.375 g  3.375 g Intravenous 3 times per day Marlyce Huge, MD   3.375 g at 06/15/15 1203  . sodium chloride 0.9 % injection 10 mL  10 mL Intravenous PRN Marlyce Huge, MD   10 mL at 06/14/15 0516  . timolol (TIMOPTIC) 0.25 % ophthalmic solution 1 drop  1 drop Both Eyes BID Marlyce Huge, MD   1 drop at 06/15/15 0746      Allergies: No Known Allergies    Past Medical History: Past Medical History  Diagnosis Date  . Hypertension   . History of hiatal hernia      Past Surgical History: Past Surgical History  Procedure Laterality Date  . Hernia repair      Two  . Back surgery    . Colectomy with colostomy creation/hartmann procedure N/A 06/09/2015    Procedure: COLECTOMY WITH COLOSTOMY CREATION/HARTMANN PROCEDURE;  Surgeon: Florene Glen, MD;   Location: [facility] ORS;  Service: General;  Laterality: N/A;  . Central venous catheter insertion N/A 06/09/2015    Procedure: INSERTION CENTRAL LINE ADULT;  Surgeon: Florene Glen, MD;  Location: [facility] ORS;  Service: General;  Laterality: N/A;     Family History: History reviewed. No pertinent  family history.   Social History: Social History   Social History  . Marital Status: Married    Spouse Name: N/A  . Number of Children: N/A  . Years of Education: N/A   Occupational History  . Not on file.   Social History Main Topics  . Smoking status: Never Smoker   . Smokeless tobacco: Not on file  . Alcohol Use: Not on file  . Drug Use: Not on file  . Sexual Activity: Not on file   Other Topics Concern  . Not on file   Social History Narrative     Review of Systems: Review of Systems  Constitutional: Positive for weight loss and malaise/fatigue. Negative for fever and chills.  HENT: Negative for tinnitus.   Eyes: Negative for blurred vision and double vision.  Respiratory: Negative for cough, hemoptysis and sputum production.   Cardiovascular: Negative for chest pain, palpitations and orthopnea.  Gastrointestinal: Positive for nausea, vomiting and abdominal pain. Negative for heartburn.  Genitourinary: Negative for dysuria and urgency.  Musculoskeletal: Negative for myalgias and neck pain.  Skin: Negative for itching and rash.  Neurological: Negative for dizziness, speech change, focal weakness and headaches.  Endo/Heme/Allergies: Negative for environmental allergies and polydipsia. Does not bruise/bleed easily.  Psychiatric/Behavioral: Negative for depression. The patient is not nervous/anxious.       Vital Signs: Blood pressure 142/68, pulse 75, temperature 97.6 F (36.4 C), temperature source Oral, resp. rate 20, height 5\' 5"  (1.651 m), weight 72.122 kg (159 lb), SpO2 98 %.  Weight trends: Filed Weights   06/13/15 0500 06/14/15 0423 06/15/15 0440  Weight: 66.8  kg (147 lb 4.3 oz) 69.31 kg (152 lb 12.8 oz) 72.122 kg (159 lb)    Physical Exam: General: NAD, resting in bed  Head: Normocephalic, atraumatic.  Eyes: Anicteric, EOMI  Nose: Mucous membranes moist, not inflammed, nonerythematous.  Throat/oral: Oral mucosa very dry  Neck: Supple trachea midline  Lungs:  Normal respiratory effort. Clear to auscultation BL without crackles or wheezes.  Heart: S1S2 no rubs  Abdomen:  Large abdominal dressing in place, colostomy in place, scant BS.  Extremities: No pretibial edema.  Neurologic: A&O X3, Motor strength is 5/5 in the all 4 extremities  Skin: No visible rashes, scars.    Lab results: Basic Metabolic Panel:  Recent Labs Lab 06/11/15 0206 06/12/15 0423  06/13/15 0439 06/13/15 1500 06/14/15 0506 06/15/15 0441  NA 136 135  < > 132* 135 130* 128*  K 3.3* 3.3*  < > 3.4* 2.8* 4.1 3.4*  CL 102 100*  < > 97* 109 98* 96*  CO2 29 28  < > 28 21* 26 25  GLUCOSE 156* 170*  < > 144* 106* 134* 164*  BUN 31* 37*  < > 37* 33* 43* 44*  CREATININE 1.40* 1.33*  < > 1.47* 1.10 1.62* 1.47*  CALCIUM 8.9 8.9  < > 8.6* 6.8* 8.6* 8.5*  MG 1.8 1.7  --  1.5*  --  2.2 1.8  PHOS 2.5 1.5*  < > 3.3  --  2.4* 2.8  < > = values in this interval not displayed.  Liver Function Tests:  Recent Labs Lab 06/09/15 0539 06/10/15 0552  AST 16 23  ALT 12* 14*  ALKPHOS 46 46  BILITOT 1.2 1.5*  PROT 5.5* 5.1*  ALBUMIN 2.6* 2.4*   No results for input(s): LIPASE, AMYLASE in the last 168 hours. No results for input(s): AMMONIA in the last 168 hours.  CBC:  Recent Labs Lab  06/11/15 0206 06/12/15 0423 06/13/15 0439 06/14/15 0506 06/15/15 0441  WBC 25.2* 26.6* 24.7* 19.9* 25.8*  NEUTROABS 23.0* 23.8* 21.0* 16.1* 23.0*  HGB 11.6* 11.0* 11.4* 10.2* 10.5*  HCT 34.4* 32.2* 33.1* 30.2* 31.3*  MCV 94.3 93.4 94.3 92.8 92.9  PLT 245 252 256 271 300    Cardiac Enzymes: No results for input(s): CKTOTAL, CKMB, CKMBINDEX, TROPONINI in the last 168  hours.  BNP: Invalid input(s): POCBNP  CBG:  Recent Labs Lab 06/14/15 1056 06/14/15 1609 06/14/15 2349 06/15/15 0442 06/15/15 1151  GLUCAP 169* 131* 148* 168* 165*    Microbiology: Results for orders placed or performed during the hospital encounter of 06/02/15  MRSA PCR Screening     Status: None   Collection Time: 06/08/15 11:36 PM  Result Value Ref Range Status   MRSA by PCR NEGATIVE NEGATIVE Final    Comment:        The GeneXpert MRSA Assay (FDA approved for NASAL specimens only), is one component of a comprehensive MRSA colonization surveillance program. It is not intended to diagnose MRSA infection nor to guide or monitor treatment for MRSA infections.     Coagulation Studies: No results for input(s): LABPROT, INR in the last 72 hours.  Urinalysis: No results for input(s): COLORURINE, LABSPEC, PHURINE, GLUCOSEU, HGBUR, BILIRUBINUR, KETONESUR, PROTEINUR, UROBILINOGEN, NITRITE, LEUKOCYTESUR in the last 72 hours.  Invalid input(s): APPERANCEUR    Imaging: Dg Chest Port 1 View  06/14/2015   CLINICAL DATA:  Shortness of breath.  EXAM: PORTABLE CHEST - 1 VIEW  COMPARISON:  06/12/2015 chest radiograph.  FINDINGS: Left subclavian central venous catheter terminates in the middle third of the superior vena cava. Stable cardiomediastinal silhouette with top-normal heart size. No pneumothorax. No pleural effusion. Stable mild biapical pleural-parenchymal scarring. Stable mild left basilar atelectasis. No new lung opacity. No pulmonary edema.  IMPRESSION: Stable mild left basilar atelectasis. Otherwise no active disease in the chest.   Electronically Signed   By: Ilona Sorrel M.D.   On: 06/14/2015 09:54   Dg Abd 2 Views  06/15/2015   CLINICAL DATA:  Inpatient. Recent surgery. Status post Hartmann's for diverticulitis. Reflux.  EXAM: ABDOMEN - 2 VIEW  COMPARISON:  CT of the abdomen and pelvis 06/08/2015 and 06/06/2015  FINDINGS: Dilated small bowel loops are identified  throughout the central abdomen. There is paucity of large bowel gas. Surgical clips overlie the lower central pelvis. Left lower quadrant ostomy. No free intraperitoneal air.  IMPRESSION: 1. Postoperative changes. 2. No evidence for free intraperitoneal air. 3. Persistent small bowel dilatation consistent with ileus or obstruction.   Electronically Signed   By: Nolon Nations M.D.   On: 06/15/2015 14:06      Assessment & Plan: Pt is a 81 y.o. yo male with a PMHX of hypertension and glaucoma, was admitted to [facility] on 06/02/2015 with perforated diverticulits.   1.  Acute renal failure, multifactorial. 2.  CKD stage III baseline Cr 1.2 3.  Hyponatremia. 4.  Anemia of CKD/blood loss. 5.  Perforated diverticulits s/p Hartman's procedure.   Plan:  We were consulted for reevaluation management of acute renal failure in the setting of chronic kidney disease stage III as well as hyponatremia. In regards to acute renal failure this is likely multifactorial with contributions from relative hypovolemia as well as use of hydrochlorothiazide.  Agree with discontinuation of hydrochlorothiazide.  Wewill check a renal ultrasound to make sure there is no underlying hydronephrosis. In regards to his hyponatremia we note that the TPN is  a very low sodium content.  However it appears that TPN contents cannot be readily changed easily.  Therefore we will add 0.9 normal saline at 30 cc per hour as the patient did have some shortness of breath with high rates of  Saline.  Further plan to be determined as patient progresses.

## 2015-06-15 NOTE — Progress Notes (Signed)
Nutrition Follow-up     INTERVENTION:  PN: Continue TPN of 5% AA/20% dextrose at goal rate of 51ml/hr to meet nutritional needs.  Discussed low Na with Dr. Holley Raring and Lattie Haw in Pharmacy.     NUTRITION DIAGNOSIS:   Inadequate oral intake related to acute illness, altered GI function as evidenced by  (NPO/CL since admission), nutrition being addressed with TPN    GOAL:   Patient will meet greater than or equal to 90% of their needs    MONITOR:    (Energy Intake, Digestive System, Electrolyte/Renal Profile, Anthropometrics)  REASON FOR ASSESSMENT:   Consult New TPN/TNA  ASSESSMENT:      Pt with reflux, hiccups.  Abdominal xray ordered.    Current Nutrition: NPO, TPN continues at 27ml/hr, lipids ran yesterday. Spoke with RN, Maddie and shortness of breath much improved today   Gastrointestinal Profile: noted abdomen soft, nondistended, ostomy with some stool noted (143ml output documented since last shift). NG tube currently not in place (removed on 8/31)    Medications: Na phosphate, KCL  Electrolyte/Renal Profile and Glucose Profile:   Recent Labs Lab 06/13/15 0439 06/13/15 1500 06/14/15 0506 06/15/15 0441  NA 132* 135 130* 128*  K 3.4* 2.8* 4.1 3.4*  CL 97* 109 98* 96*  CO2 28 21* 26 25  BUN 37* 33* 43* 44*  CREATININE 1.47* 1.10 1.62* 1.47*  CALCIUM 8.6* 6.8* 8.6* 8.5*  MG 1.5*  --  2.2 1.8  PHOS 3.3  --  2.4* 2.8  GLUCOSE 144* 106* 134* 164*   Protein Profile:  Recent Labs Lab 06/09/15 0539 06/10/15 0552  ALBUMIN 2.6* 2.4*      Weight Trend since Admission: Filed Weights   06/13/15 0500 06/14/15 0423 06/15/15 0440  Weight: 147 lb 4.3 oz (66.8 kg) 152 lb 12.8 oz (69.31 kg) 159 lb (72.122 kg)   Noted wt increase  Urine output: 957ml last 24 hr noted   Diet Order:  Diet NPO time specified Except for: Sips with Meds TPN (CLINIMIX-E) Adult .TPN (CLINIMIX-E) Adult  Skin:  Reviewed, no issues  Last BM:  8/24  Height:   Ht Readings  from Last 1 Encounters:  06/03/15 5\' 5"  (1.651 m)    Weight:   Wt Readings from Last 1 Encounters:  06/15/15 159 lb (72.122 kg)    Ideal Body Weight:     BMI:  Body mass index is 26.46 kg/(m^2).  Estimated Nutritional Needs:   Kcal:  1709-2019 kcals (1195, 1.3 AF, 1.1-1.3 IF)   Protein:  67-85 g (1.1-1.4 g/kg)   Fluid:  1525-1830 mL (25-30 ml/kg)   EDUCATION NEEDS:   No education needs identified at this time  [place]. Zenia Resides, [place], [place] (pager)

## 2015-06-16 ENCOUNTER — Inpatient Hospital Stay: Payer: Medicare Other

## 2015-06-16 LAB — CBC WITH DIFFERENTIAL/PLATELET
Basophils Absolute: 0.1 10*3/uL (ref 0–0.1)
Basophils Relative: 0 %
Eosinophils Absolute: 0.1 10*3/uL (ref 0–0.7)
HCT: 27.9 % — ABNORMAL LOW (ref 40.0–52.0)
HEMOGLOBIN: 9.5 g/dL — AB (ref 13.0–18.0)
LYMPHS ABS: 0.8 10*3/uL — AB (ref 1.0–3.6)
MCH: 31.5 pg (ref 26.0–34.0)
MCHC: 34.2 g/dL (ref 32.0–36.0)
MCV: 92 fL (ref 80.0–100.0)
Monocytes Absolute: 2.7 10*3/uL — ABNORMAL HIGH (ref 0.2–1.0)
Neutro Abs: 26.2 10*3/uL — ABNORMAL HIGH (ref 1.4–6.5)
Platelets: 315 10*3/uL (ref 150–440)
RBC: 3.03 MIL/uL — AB (ref 4.40–5.90)
RDW: 15.5 % — ABNORMAL HIGH (ref 11.5–14.5)
WBC: 29.9 10*3/uL — AB (ref 3.8–10.6)

## 2015-06-16 LAB — PHOSPHORUS: Phosphorus: 2.5 mg/dL (ref 2.5–4.6)

## 2015-06-16 LAB — BASIC METABOLIC PANEL
ANION GAP: 5 (ref 5–15)
ANION GAP: 6 (ref 5–15)
BUN: 44 mg/dL — ABNORMAL HIGH (ref 6–20)
BUN: 48 mg/dL — ABNORMAL HIGH (ref 6–20)
CHLORIDE: 97 mmol/L — AB (ref 101–111)
CHLORIDE: 95 mmol/L — AB (ref 101–111)
CO2: 26 mmol/L (ref 22–32)
CO2: 24 mmol/L (ref 22–32)
CREATININE: 1.28 mg/dL — AB (ref 0.61–1.24)
Calcium: 8.4 mg/dL — ABNORMAL LOW (ref 8.9–10.3)
Calcium: 8.3 mg/dL — ABNORMAL LOW (ref 8.9–10.3)
Creatinine, Ser: 1.32 mg/dL — ABNORMAL HIGH (ref 0.61–1.24)
GFR calc non Af Amer: 46 mL/min — ABNORMAL LOW (ref >60)
GFR calc non Af Amer: 48 mL/min — ABNORMAL LOW (ref >60)
GFR, EST AFRICAN AMERICAN: 53 mL/min — AB (ref >60)
GFR, EST AFRICAN AMERICAN: 55 mL/min — AB (ref >60)
GLUCOSE: 157 mg/dL — AB (ref 65–99)
Glucose, Bld: 205 mg/dL — ABNORMAL HIGH (ref 65–99)
POTASSIUM: 4 mmol/L (ref 3.5–5.1)
Potassium: 3.3 mmol/L — ABNORMAL LOW (ref 3.5–5.1)
SODIUM: 125 mmol/L — AB (ref 135–145)
Sodium: 128 mmol/L — ABNORMAL LOW (ref 135–145)

## 2015-06-16 LAB — GLUCOSE, CAPILLARY
GLUCOSE-CAPILLARY: 193 mg/dL — AB (ref 65–99)
GLUCOSE-CAPILLARY: 242 mg/dL — AB (ref 65–99)
Glucose-Capillary: 154 mg/dL — ABNORMAL HIGH (ref 65–99)
Glucose-Capillary: 173 mg/dL — ABNORMAL HIGH (ref 65–99)

## 2015-06-16 LAB — CORTISOL: Cortisol, Plasma: 33.3 ug/dL

## 2015-06-16 LAB — MAGNESIUM: Magnesium: 1.6 mg/dL — ABNORMAL LOW (ref 1.7–2.4)

## 2015-06-16 MED ORDER — APIXABAN 5 MG PO TABS
5.0000 mg | ORAL_TABLET | Freq: Two times a day (BID) | ORAL | Status: DC
  Administered 2015-06-16 – 2015-06-18 (×6): 5 mg via ORAL
  Filled 2015-06-16 (×6): qty 1

## 2015-06-16 MED ORDER — IPRATROPIUM-ALBUTEROL 0.5-2.5 (3) MG/3ML IN SOLN
RESPIRATORY_TRACT | Status: AC
  Administered 2015-06-16: 3 mL via RESPIRATORY_TRACT
  Filled 2015-06-16: qty 3

## 2015-06-16 MED ORDER — IOHEXOL 240 MG/ML SOLN
25.0000 mL | INTRAMUSCULAR | Status: AC
  Administered 2015-06-16: 25 mL via ORAL

## 2015-06-16 MED ORDER — M.V.I. ADULT IV INJ
INJECTION | INTRAVENOUS | Status: AC
Start: 2015-06-16 — End: 2015-06-17
  Administered 2015-06-16: 18:00:00 via INTRAVENOUS
  Filled 2015-06-16: qty 1800

## 2015-06-16 MED ORDER — METHYLPREDNISOLONE SODIUM SUCC 125 MG IJ SOLR
60.0000 mg | Freq: Four times a day (QID) | INTRAMUSCULAR | Status: DC
  Administered 2015-06-16 – 2015-06-17 (×5): 60 mg via INTRAVENOUS
  Filled 2015-06-16 (×5): qty 2

## 2015-06-16 MED ORDER — FUROSEMIDE 10 MG/ML IJ SOLN
40.0000 mg | Freq: Once | INTRAMUSCULAR | Status: AC
  Administered 2015-06-16: 40 mg via INTRAVENOUS
  Filled 2015-06-16: qty 4

## 2015-06-16 MED ORDER — IPRATROPIUM-ALBUTEROL 0.5-2.5 (3) MG/3ML IN SOLN: 3.0000 mL | RESPIRATORY_TRACT | Status: DC | PRN

## 2015-06-16 MED ORDER — POTASSIUM CHLORIDE 10 MEQ/100ML IV SOLN
10.0000 meq | INTRAVENOUS | Status: AC
  Administered 2015-06-16 (×4): 10 meq via INTRAVENOUS
  Filled 2015-06-16 (×4): qty 100

## 2015-06-16 MED ORDER — MAGNESIUM SULFATE 4 GM/100ML IV SOLN
4.0000 g | Freq: Once | INTRAVENOUS | Status: AC
  Administered 2015-06-16: 4 g via INTRAVENOUS
  Filled 2015-06-16: qty 100

## 2015-06-16 MED ORDER — POTASSIUM CHLORIDE CRYS ER 20 MEQ PO TBCR: 40.0000 meq | EXTENDED_RELEASE_TABLET | Freq: Once | ORAL | Status: DC

## 2015-06-16 MED ORDER — IPRATROPIUM-ALBUTEROL 0.5-2.5 (3) MG/3ML IN SOLN
3.0000 mL | RESPIRATORY_TRACT | Status: DC
  Administered 2015-06-16 – 2015-06-17 (×7): 3 mL via RESPIRATORY_TRACT
  Filled 2015-06-16 (×7): qty 3

## 2015-06-16 NOTE — Progress Notes (Signed)
Surgery Progress Note  S: No acute changes.  Not progressing as expected O:Blood pressure 149/63, pulse 80, temperature 98.2 F (36.8 C), temperature source Oral, resp. rate 25, height 5\' 5"  (1.651 m), weight 156 lb 4.8 oz (70.897 kg), SpO2 97 %. GEN: NAD, A&Ox3, weak ABD: soft, min tender, mild distention, wound with penrose, minimal drainage.  Ostomy pink  WBC 29.9  A/P 81 yo s/p hartmanns, not improving clinically.  WBC continuing to improve - PT - OOB to chair - CT abdomen/pelvis

## 2015-06-16 NOTE — Progress Notes (Signed)
Jesse Macias NAME: Jesse Macias    MR#:  [id number]  DATE OF BIRTH:  [birth year]  SUBJECTIVE:  Sodium 128, leukocytosis worsening.  Wife at bedside.  Breathing seems worse, also wheezing REVIEW OF SYSTEMS:    Review of Systems  Constitutional: Negative for fever, chills and malaise/fatigue.  HENT: Negative for sore throat.   Eyes: Negative for blurred vision.  Respiratory: Positive for cough, shortness of breath and wheezing. Negative for hemoptysis.   Cardiovascular: Negative for chest pain, palpitations and leg swelling.  Gastrointestinal: Positive for abdominal pain. Negative for nausea, vomiting, diarrhea and blood in stool.  Genitourinary: Negative for dysuria.  Musculoskeletal: Negative for back pain.  Neurological: Negative for dizziness, tremors and headaches.  Endo/Heme/Allergies: Does not bruise/bleed easily.    Tolerating Diet: On TPN DRUG ALLERGIES:  No Known Allergies VITALS:  Blood pressure 126/60, pulse 85, temperature 97.3 F (36.3 C), temperature source Oral, resp. rate 24, height 5\' 5"  (1.651 m), weight 70.897 kg (156 lb 4.8 oz), SpO2 99 %. PHYSICAL EXAMINATION:   Physical Exam  Constitutional: He is well-developed, well-nourished, and in no distress. No distress.  HENT:  Head: Normocephalic.  Eyes: No scleral icterus.  Neck: Normal range of motion. Neck supple. No JVD present. No tracheal deviation present.  Cardiovascular: Normal rate, regular rhythm and normal heart sounds.  Exam reveals no gallop and no friction rub.   No murmur heard. Pulmonary/Chest: Accessory muscle usage present. Tachypnea noted. He is in respiratory distress. He has decreased breath sounds. He has wheezes. He has rales. He exhibits no tenderness.  Abdominal: Soft. He exhibits no distension and no mass. There is tenderness. There is no rebound and no guarding.  Penrose drain  Musculoskeletal: Normal range of motion. He  exhibits no edema.  Skin: Skin is warm. No rash noted. No erythema.  Psychiatric: Affect and judgment normal.   LABORATORY PANEL:   CBC  Recent Labs Lab 06/16/15 0450  WBC 29.9*  HGB 9.5*  HCT 27.9*  PLT 315   ------------------------------------------------------------------------------------------------------------------  Chemistries   Recent Labs Lab 06/10/15 0552  06/16/15 0450  NA 136  < > 128*  K 3.5  < > 3.3*  CL 101  < > 97*  CO2 28  < > 26  GLUCOSE 199*  < > 157*  BUN 28*  < > 44*  CREATININE 1.42*  < > 1.32*  CALCIUM 8.9  < > 8.4*  MG 1.5*  < > 1.6*  AST 23  --   --   ALT 14*  --   --   ALKPHOS 46  --   --   BILITOT 1.5*  --   --   < > = values in this interval not displayed. ------------------------------------------------------------------------------------------------------------------  Cardiac Enzymes No results for input(s): TROPONINI in the last 168 hours. ------------------------------------------------------------------------------------------------------------------  RADIOLOGY:  Ct Abdomen Pelvis Wo Contrast  06/16/2015   CLINICAL DATA:  Elevated white blood cell count. History of perforated diverticulitis with Hartmann's pouch 06/09/2015. LEFT lower quadrant colostomy.  EXAM: CT ABDOMEN AND PELVIS WITHOUT CONTRAST  TECHNIQUE: Multidetector CT imaging of the abdomen and pelvis was performed following the standard protocol without IV contrast.  COMPARISON:  CT 06/10/2015  FINDINGS: Lower chest: Mild bibasilar effusions.  Hepatobiliary: Small hepatic cysts in the caudate lobe. No biliary duct dilatation. The gallbladder is normal.  Pancreas: Pancreas is normal. No ductal dilatation. No pancreatic inflammation.  Spleen: Normal spleen  Adrenals/urinary tract: Adrenal glands  are normal. Simple fluid attenuation lesion upper pole of the RIGHT kidney. No renal obstruction. Foley catheter within the bladder. Small amount a gas within the bladder likely related  to catheterization.  Stomach/Bowel: Stomach and duodenum are normal. The proximal small bowel mildly dilated 3.4 cm. There is poor progression of the oral contrast. The distal small bowel is collapsed over a fairly long segment leading up to the terminal ileum. No transition point identified. The ascending transverse and proximal descending colon are collapsed. Colostomy appears normal in LEFT lower quadrant. Hartmann's pouch is intact.  Small amount fluid in LEFT lower quadrant along of small bowel loops (image 62, series 2). There is mild enhancement of the peritoneal surface at this level. This fluid collection is not well organized. This is site of perforated diverticulitis on comparison CT  Vascular/Lymphatic: Abdominal aorta is normal caliber with atherosclerotic calcification. There is no retroperitoneal or periportal lymphadenopathy. No pelvic lymphadenopathy.  Reproductive: Prostate normal.  Musculoskeletal: No aggressive osseous lesion.  Other: There is a midline surgical drain extending vertically within the subcutaneous tissue. No evidence abscess.  IMPRESSION: 1. Poor progression of the oral contrast through the small bowel with a caliber change from mildly dilated proximal small bowel to collapsed distal small bowel and colon. Favor small bowel ileus over obstruction however consider follow-up radiographs to evaluate for potential developing obstruction. 2. Small amount free fluid in the LEFT lower quadrant does have thin enhancing rim but is not well organized. The findings suggest mild peritonitis over abscess. Recommend attention on follow-up. This was the site of perforated diverticulitis. 3. Hartmann's pouch appears normal. 4. Bilateral pleural effusions.   Electronically Signed   By: Suzy Bouchard M.D.   On: 06/16/2015 10:57   Dg Abd 2 Views  06/15/2015   CLINICAL DATA:  Inpatient. Recent surgery. Status post Hartmann's for diverticulitis. Reflux.  EXAM: ABDOMEN - 2 VIEW  COMPARISON:  CT of  the abdomen and pelvis 06/08/2015 and 06/06/2015  FINDINGS: Dilated small bowel loops are identified throughout the central abdomen. There is paucity of large bowel gas. Surgical clips overlie the lower central pelvis. Left lower quadrant ostomy. No free intraperitoneal air.  IMPRESSION: 1. Postoperative changes. 2. No evidence for free intraperitoneal air. 3. Persistent small bowel dilatation consistent with ileus or obstruction.   Electronically Signed   By: Nolon Nations M.D.   On: 06/15/2015 14:06   ASSESSMENT AND PLAN:  81 year old male with recurrent diverticulitis status post Hartmann procedure.  1. Rapid a.fib: now Heart rate is better controlled. Cardiology seen and following. Elquis is restarted. Pharmacy is assisting with this. continue metoprolol for rate control and amiodarone for now. Once patient is eating amiodarone can be changed to oral.  2. Uncontrolled hypertension: Patient's blood pressure has much improved. Continue metoprolol, HCTZ and hydralazine.  3. Hyponatremia: Now down to 128, appreciate nephrology input for fluid and electrolyte management, started on normal saline at 30 cc an hour  4. Acute respiratory failure: He is using accessory muscles of respiration and seem to be in distress.  He is also tachypneic and wheezing throughout both lungs - Likely from bilateral pleural effusion, and fluid overload.  Try 1 dose of Lasix.  Also add IV steroids at this time,  nebulizer to be schedule every 4 hours for now.  He is +13.8 L.   5. Perforated diverticulitis: Patient is status post Hartman's procedure. As per surgery.  Repeat CT scan today Worrisome for small bowel ileus, ? Mild peritonitis at the site  of perforated diverticulitis. Bilateral pleural effusions.  6.  Worsening leukocytosis: Appreciate infectious disease input, worsening white count, patient is afebrile.  Continue Zosyn   7. Acute renal failure: Improved with IV fluids.  Nephrology consultation appreciated    8. Glaucoma: Continue atenolol and XALATAN eyedrops   Management plans discussed with the patient, Dr. Rexene Edison , family and they are in agreement.  CODE STATUS: FULL  TOTAL TIME (CRITICAL CARE) TAKING CARE OF THIS PATIENT: 35 minutes.   His breathing seems worrisome and is at high risk for cardiorespiratory arrest - may need CCU transfer if worsens   D/C plans per primary team.   Max Sane M.D on 06/16/2015 at 11:49 AM  Between 7am to 6pm - Pager - [phone] After 6pm go to [email or web address] - password [email or web address] [facility]  [facility]  Office  [phone]  CC:  Primary care physician; No primary care provider on file.

## 2015-06-16 NOTE — Progress Notes (Signed)
MEDICATION RELATED CONSULT NOTE - FOLLOW UP   Pharmacy Consult for Apixaban (Eliquis) Indication: Apixaban for Afib  No Known Allergies  Patient Measurements: Height: 5\' 5"  (165.1 cm) Weight: 156 lb 4.8 oz (70.897 kg) IBW/kg (Calculated) : 61.5   Vital Signs: Temp: 97.3 F (36.3 C) (09/03 0811) Temp Source: Oral (09/03 0811) BP: 126/60 mmHg (09/03 0811) Pulse Rate: 85 (09/03 0811)  Labs:  Recent Labs  06/14/15 0506 06/15/15 0441 06/15/15 1545 06/16/15 0450  WBC 19.9* 25.8*  --  29.9*  HGB 10.2* 10.5*  --  9.5*  HCT 30.2* 31.3*  --  27.9*  PLT 271 300  --  315  CREATININE 1.62* 1.47*  --  1.32*  LABCREA  --   --  62  --   MG 2.2 1.8  --  1.6*  PHOS 2.4* 2.8  --  2.5   Estimated Creatinine Clearance: 33 mL/min (by C-G formula based on Cr of 1.32).   Microbiology: Recent Results (from the past 720 hour(s))  MRSA PCR Screening     Status: None   Collection Time: 06/08/15 11:36 PM  Result Value Ref Range Status   MRSA by PCR NEGATIVE NEGATIVE Final    Comment:        The GeneXpert MRSA Assay (FDA approved for NASAL specimens only), is one component of a comprehensive MRSA colonization surveillance program. It is not intended to diagnose MRSA infection nor to guide or monitor treatment for MRSA infections.   Urine culture     Status: None (Preliminary result)   Collection Time: 06/15/15  3:45 PM  Result Value Ref Range Status   Specimen Description URINE, CLEAN CATCH  Final   Special Requests zosyn Normal  Final   Culture NO GROWTH < 24 HOURS  Final   Report Status PENDING  Incomplete  Culture, blood (routine x 2)     Status: None (Preliminary result)   Collection Time: 06/15/15  3:54 PM  Result Value Ref Range Status   Specimen Description BLOOD LEFT ASSIST CONTROL  Final   Special Requests BOTTLES DRAWN AEROBIC AND ANAEROBIC  6CC  Final   Culture NO GROWTH < 24 HOURS  Final   Report Status PENDING  Incomplete  Culture, blood (routine x 2)     Status:  None (Preliminary result)   Collection Time: 06/15/15  4:00 PM  Result Value Ref Range Status   Specimen Description BLOOD RIGHT ASSIST CONTROL  Final   Special Requests   Final    BOTTLES DRAWN AEROBIC AND ANAEROBIC  AER 113 CC ANA 11CC   Culture NO GROWTH < 24 HOURS  Final   Report Status PENDING  Incomplete    Medications:  Scheduled:  . apixaban  2.5 mg Oral BID  . brimonidine  1 drop Both Eyes BID  . dorzolamide  1 drop Both Eyes BID  . hydrALAZINE  25 mg Oral 3 times per day  . hypromellose   Both Eyes QHS  . insulin aspart  0-9 Units Subcutaneous 4 times per day  . iohexol  25 mL Oral Q1 Hr x 2  . latanoprost  1 drop Both Eyes QHS  . magnesium sulfate 1 - 4 g bolus IVPB  4 g Intravenous Once  . metoprolol tartrate  25 mg Oral BID  . piperacillin-tazobactam (ZOSYN)  IV  3.375 g Intravenous 3 times per day  . potassium chloride  10 mEq Intravenous Q1 Hr x 4  . timolol  1 drop Both Eyes BID  PRN: [DISCONTINUED] acetaminophen **OR** acetaminophen, alum & mag hydroxide-simeth, calcium carbonate, hydrALAZINE, HYDROmorphone (DILAUDID) injection, ipratropium-albuterol, LORazepam, menthol-cetylpyridinium, metoprolol, ondansetron **OR** ondansetron (ZOFRAN) IV, sodium chloride  Assessment: Apixaban: Patient previously on Apixaban 2.5 mg po BID chronically for anticoagulation due to atrial fibrillation. Medication was withheld upon admission due to surgery.  Per Surgery note today , 06/12/15, OK to begin anticoagulation  Plan:  Current orders for apixaban 2.5 mg po bid. Patient's SCr today is 1.32. Will adjust apixaban to 5 mg PO BID based on SCr <1.5 with TBW > 60kg and age > 85 yrs  Pharmacy will continue to follow.  Rexene Edison, PharmD Clinical Pharmacist   06/16/2015 9:12 AM

## 2015-06-16 NOTE — Progress Notes (Signed)
PARENTERAL NUTRITION CONSULT NOTE - Follow up  Pharmacy Consult for Electrolyte/Glucose Management Indication: TPN  No Known Allergies  Patient Measurements: Height: 5\' 5"  (165.1 cm) Weight: 156 lb 4.8 oz (70.897 kg) IBW/kg (Calculated) : 61.5 Adjusted Body Weight: na  Usual Weight: na  Vital Signs: Temp: 97.7 F (36.5 C) (09/03 1205) Temp Source: Oral (09/03 1205) BP: 116/61 mmHg (09/03 1205) Pulse Rate: 75 (09/03 1205) Intake/Output from previous day: 09/02 0701 - 09/03 0700 In: 2339.5 [I.V.:422; IV Piggyback:650; TPN:1267.5] Out: 2350 [Urine:2175; Stool:175] Intake/Output from this shift: Total I/O In: 550 [IV Piggyback:550] Out: 550 [Urine:450; Stool:100]  Labs:  Recent Labs  06/14/15 0506 06/15/15 0441 06/16/15 0450  WBC 19.9* 25.8* 29.9*  HGB 10.2* 10.5* 9.5*  HCT 30.2* 31.3* 27.9*  PLT 271 300 315     Recent Labs  06/14/15 0506 06/15/15 0441 06/15/15 1545 06/16/15 0450 06/16/15 1243  NA 130* 128*  --  128* 125*  K 4.1 3.4*  --  3.3* 4.0  CL 98* 96*  --  97* 95*  CO2 26 25  --  26 24  GLUCOSE 134* 164*  --  157* 205*  BUN 43* 44*  --  44* 48*  CREATININE 1.62* 1.47*  --  1.32* 1.28*  LABCREA  --   --  62  --   --   CALCIUM 8.6* 8.5*  --  8.4* 8.3*  MG 2.2 1.8  --  1.6*  --   PHOS 2.4* 2.8  --  2.5  --    Estimated Creatinine Clearance: 34 mL/min (by C-G formula based on Cr of 1.28).    Recent Labs  06/16/15 0012 06/16/15 0456 06/16/15 1204  GLUCAP 173* 154* 193*    Medical History: Past Medical History  Diagnosis Date  . Hypertension   . History of hiatal hernia     Medications:  Infusions:  . Marland KitchenTPN (CLINIMIX-E) Adult 75 mL/hr at 06/15/15 2318  . Marland KitchenTPN (CLINIMIX-E) Adult    . sodium chloride 30 mL/hr at 06/15/15 2318  . amiodarone 30 mg/hr (06/16/15 0954)    Assessment: Pharmacy consulted to manage electrolytes and glucose in this 81 year old male on TPN.   Current orders for Clinimix E 5/20 at 33ml/hr  24 hr SSI use: 8  units, FSBG: 154-205  Plan:  0903 AM: Na 128, K 3.3, Phos 2.5, Mg 1.6. Ordered Mg 4 gm IV x 1 and KCl 10 mEq IV x 4 doses and will follow up BMP at noon, other electrolytes with AM labs.   Repeat electrolytes WNL. No additional supplementation, will follow up with AM labs.   Continue Q6H SSI  Rexene Edison, PharmD Clinical Pharmacist   06/16/2015 1:46 PM

## 2015-06-16 NOTE — Progress Notes (Signed)
Spoke with dr. Manuella Ghazi regarding scheduled dose of hydralazine bp 116/61 per md hold scheduled dose. Also made md aware current potassium is 4.0 has scheduled dose of potassium po, per md discontinue order does not need po dose [employer]

## 2015-06-16 NOTE — Progress Notes (Signed)
PARENTERAL NUTRITION CONSULT NOTE - Follow up  Pharmacy Consult for Electrolyte/Glucose Management Indication: TPN  No Known Allergies  Patient Measurements: Height: 5\' 5"  (165.1 cm) Weight: 159 lb (72.122 kg) IBW/kg (Calculated) : 61.5 Adjusted Body Weight: na  Usual Weight: na  Vital Signs: Temp: 98.2 F (36.8 C) (09/03 0420) Temp Source: Oral (09/03 0420) BP: 149/63 mmHg (09/03 0420) Pulse Rate: 80 (09/03 0420) Intake/Output from previous day: 09/02 0701 - 09/03 0700 In: 2289.5 [I.V.:422; IV Piggyback:600; TPN:1267.5] Out: 2350 [Urine:2175; Stool:175] Intake/Output from this shift: Total I/O In: 573 [I.V.:200.5; IV Piggyback:50; TPN:322.5] Out: 725 [Urine:650; Stool:75]  Labs:  Recent Labs  06/14/15 0506 06/15/15 0441 06/16/15 0450  WBC 19.9* 25.8* 29.9*  HGB 10.2* 10.5* 9.5*  HCT 30.2* 31.3* 27.9*  PLT 271 300 315     Recent Labs  06/14/15 0506 06/15/15 0441 06/15/15 1545 06/16/15 0450  NA 130* 128*  --  128*  K 4.1 3.4*  --  3.3*  CL 98* 96*  --  97*  CO2 26 25  --  26  GLUCOSE 134* 164*  --  157*  BUN 43* 44*  --  44*  CREATININE 1.62* 1.47*  --  1.32*  LABCREA  --   --  62  --   CALCIUM 8.6* 8.5*  --  8.4*  MG 2.2 1.8  --  1.6*  PHOS 2.4* 2.8  --  2.5   Estimated Creatinine Clearance: 33 mL/min (by C-G formula based on Cr of 1.32).    Recent Labs  06/15/15 1758 06/16/15 0012 06/16/15 0456  GLUCAP 187* 173* 154*    Medical History: Past Medical History  Diagnosis Date  . Hypertension   . History of hiatal hernia     Medications:  Infusions:  . Marland KitchenTPN (CLINIMIX-E) Adult 75 mL/hr at 06/15/15 2318  . sodium chloride 30 mL/hr at 06/15/15 2318  . amiodarone 30 mg/hr (06/15/15 2317)    Assessment: 8/31 am labs: K 3.3, Mg 1.5, phos 3.3.  8/31 Labs at 1500:  K 2.8   Plan:  Ordered KCl 10 mEq IV x 4 and magnesium 4 gm IV x 1, will recheck potassium this afternoon and all electrolytes tomorrow morning.   8/31 at 1650:  Dr. Ashok Cordia  has ordered KCL 39meq IV x 1 replacement and Magnesium 2 gram IV x 1. Follow labs in am.  0901 AM: Na 130, K 4.1, Phos 2.4, Mg 2.2. Ordered sodium phosphate 12 mmol IV x 1. Will follow up electrolytes tomorrow morning.   0902 AM: Na 128, K 3.4, Phos 2.8, Mg 1.8. Renal function appears to be at baseline. Ordered KCl 10 mEq IV x 4 doses and will follow up tomorrow morning.  0903 AM: Na 128, K 3.3, Phos 2.5, Mg 1.6. Ordered Mg 4 gm IV x 1 and KCl 10 mEq IV x 4 doses and will follow up BMP at noon, other electrolytes with AM labs.   Tanuj Mullens A. Jordan Hawks, PharmD Clinical Pharmacist 06/16/2015 6:07 AM

## 2015-06-16 NOTE — Progress Notes (Signed)
Spoke with dr. Rexene Edison to make aware patient respiratory rate is ranging from 22-30. Patient currently on prn duoneb. Per md sit patient up right or in chair and change duonebs to q4hs scheduled. Will continue to monitor closely [employer]

## 2015-06-16 NOTE — Progress Notes (Signed)
[facility]  ROUNDING NOTE   Subjective:   Wife at bedside.  CT this morning, no source of infection Continues to have nausea.   Objective:  Vital signs in last 24 hours:  Temp:  [97.3 F (36.3 C)-98.2 F (36.8 C)] 97.3 F (36.3 C) (09/03 0811) Pulse Rate:  [73-86] 85 (09/03 0811) Resp:  [18-25] 24 (09/03 0811) BP: (113-155)/(57-76) 126/60 mmHg (09/03 0811) SpO2:  [97 %-100 %] 99 % (09/03 0811) FiO2 (%):  [28 %] 28 % (09/02 2051) Weight:  [70.897 kg (156 lb 4.8 oz)] 70.897 kg (156 lb 4.8 oz) (09/03 0420)  Weight change: -1.225 kg (-2 lb 11.2 oz) Filed Weights   06/14/15 0423 06/15/15 0440 06/16/15 0420  Weight: 69.31 kg (152 lb 12.8 oz) 72.122 kg (159 lb) 70.897 kg (156 lb 4.8 oz)    Intake/Output: I/O last 3 completed shifts: In: 3919 [I.V.:614; IV Piggyback:700] Out: 2350 [Urine:2175; Stool:175]   Intake/Output this shift:  Total I/O In: 500 [IV Piggyback:500] Out: 250 [Urine:150; Stool:100]  Physical Exam: General: NAD, elderly, fraile  Head: Normocephalic, atraumatic. Dry oral mucosal membranes  Eyes: Anicteric, PERRL  Neck: Supple, trachea midline  Lungs:  Clear to auscultation  Heart: Regular rate and rhythm  Abdomen:  Soft, nontender,   Extremities: no peripheral edema.  Neurologic: Nonfocal, moving all four extremities  Skin: No lesions  GU +foley    Basic Metabolic Panel:  Recent Labs Lab 06/12/15 0423 06/12/15 1647 06/13/15 0439 06/13/15 1500 06/14/15 0506 06/15/15 0441 06/16/15 0450  NA 135 132* 132* 135 130* 128* 128*  K 3.3* 3.4* 3.4* 2.8* 4.1 3.4* 3.3*  CL 100* 98* 97* 109 98* 96* 97*  CO2 _0 21* _1 GLUCOSE 170* 129* 144* 106* 134* 164* 157*  BUN 37* 38* 37* 33* 43* 44* 44*  CREATININE 1.33* 1.40* 1.47* 1.10 1.62* 1.47* 1.32*  CALCIUM 8.9 8.9 8.6* 6.8* 8.6* 8.5* 8.4*  MG 1.7  --  1.5*  --  2.2 1.8 1.6*  PHOS 1.5* 2.4* 3.3  --  2.4* 2.8 2.5    Liver Function Tests:  Recent Labs Lab 06/10/15 0552   AST 23  ALT 14*  ALKPHOS 46  BILITOT 1.5*  PROT 5.1*  ALBUMIN 2.4*   No results for input(s): LIPASE, AMYLASE in the last 168 hours. No results for input(s): AMMONIA in the last 168 hours.  CBC:  Recent Labs Lab 06/12/15 0423 06/13/15 0439 06/14/15 0506 06/15/15 0441 06/16/15 0450  WBC 26.6* 24.7* 19.9* 25.8* 29.9*  NEUTROABS 23.8* 21.0* 16.1* 23.0* 26.2*  HGB 11.0* 11.4* 10.2* 10.5* 9.5*  HCT 32.2* 33.1* 30.2* 31.3* 27.9*  MCV 93.4 94.3 92.8 92.9 92.0  PLT 252 256 271 300 315    Cardiac Enzymes: No results for input(s): CKTOTAL, CKMB, CKMBINDEX, TROPONINI in the last 168 hours.  BNP: Invalid input(s): POCBNP  CBG:  Recent Labs Lab 06/15/15 0442 06/15/15 1151 06/15/15 1758 06/16/15 0012 06/16/15 0456  GLUCAP 168* 165* 187* 173* 154*    Microbiology: Results for orders placed or performed during the hospital encounter of 06/02/15  MRSA PCR Screening     Status: None   Collection Time: 06/08/15 11:36 PM  Result Value Ref Range Status   MRSA by PCR NEGATIVE NEGATIVE Final    Comment:        The GeneXpert MRSA Assay (FDA approved for NASAL specimens only), is one component of a comprehensive MRSA colonization surveillance program. It is not intended to diagnose MRSA  infection nor to guide or monitor treatment for MRSA infections.   Urine culture     Status: None (Preliminary result)   Collection Time: 06/15/15  3:45 PM  Result Value Ref Range Status   Specimen Description URINE, CLEAN CATCH  Final   Special Requests zosyn Normal  Final   Culture NO GROWTH < 24 HOURS  Final   Report Status PENDING  Incomplete  Culture, blood (routine x 2)     Status: None (Preliminary result)   Collection Time: 06/15/15  3:54 PM  Result Value Ref Range Status   Specimen Description BLOOD LEFT ASSIST CONTROL  Final   Special Requests BOTTLES DRAWN AEROBIC AND ANAEROBIC  6CC  Final   Culture NO GROWTH < 24 HOURS  Final   Report Status PENDING  Incomplete   Culture, blood (routine x 2)     Status: None (Preliminary result)   Collection Time: 06/15/15  4:00 PM  Result Value Ref Range Status   Specimen Description BLOOD RIGHT ASSIST CONTROL  Final   Special Requests   Final    BOTTLES DRAWN AEROBIC AND ANAEROBIC  AER 113 CC ANA 11CC   Culture NO GROWTH < 24 HOURS  Final   Report Status PENDING  Incomplete    Coagulation Studies: No results for input(s): LABPROT, INR in the last 72 hours.  Urinalysis:  Recent Labs  06/15/15 1545  COLORURINE YELLOW*  LABSPEC 1.021  PHURINE 5.0  GLUCOSEU 150*  HGBUR 1+*  BILIRUBINUR NEGATIVE  KETONESUR NEGATIVE  PROTEINUR 30*  NITRITE NEGATIVE  LEUKOCYTESUR NEGATIVE      Imaging: Ct Abdomen Pelvis Wo Contrast  06/16/2015   CLINICAL DATA:  Elevated white blood cell count. History of perforated diverticulitis with Hartmann's pouch 06/09/2015. LEFT lower quadrant colostomy.  EXAM: CT ABDOMEN AND PELVIS WITHOUT CONTRAST  TECHNIQUE: Multidetector CT imaging of the abdomen and pelvis was performed following the standard protocol without IV contrast.  COMPARISON:  CT 06/10/2015  FINDINGS: Lower chest: Mild bibasilar effusions.  Hepatobiliary: Small hepatic cysts in the caudate lobe. No biliary duct dilatation. The gallbladder is normal.  Pancreas: Pancreas is normal. No ductal dilatation. No pancreatic inflammation.  Spleen: Normal spleen  Adrenals/urinary tract: Adrenal glands are normal. Simple fluid attenuation lesion upper pole of the RIGHT kidney. No renal obstruction. Foley catheter within the bladder. Small amount a gas within the bladder likely related to catheterization.  Stomach/Bowel: Stomach and duodenum are normal. The proximal small bowel mildly dilated 3.4 cm. There is poor progression of the oral contrast. The distal small bowel is collapsed over a fairly long segment leading up to the terminal ileum. No transition point identified. The ascending transverse and proximal descending colon are  collapsed. Colostomy appears normal in LEFT lower quadrant. Hartmann's pouch is intact.  Small amount fluid in LEFT lower quadrant along of small bowel loops (image 62, series 2). There is mild enhancement of the peritoneal surface at this level. This fluid collection is not well organized. This is site of perforated diverticulitis on comparison CT  Vascular/Lymphatic: Abdominal aorta is normal caliber with atherosclerotic calcification. There is no retroperitoneal or periportal lymphadenopathy. No pelvic lymphadenopathy.  Reproductive: Prostate normal.  Musculoskeletal: No aggressive osseous lesion.  Other: There is a midline surgical drain extending vertically within the subcutaneous tissue. No evidence abscess.  IMPRESSION: 1. Poor progression of the oral contrast through the small bowel with a caliber change from mildly dilated proximal small bowel to collapsed distal small bowel and colon. Favor small bowel  ileus over obstruction however consider follow-up radiographs to evaluate for potential developing obstruction. 2. Small amount free fluid in the LEFT lower quadrant does have thin enhancing rim but is not well organized. The findings suggest mild peritonitis over abscess. Recommend attention on follow-up. This was the site of perforated diverticulitis. 3. Hartmann's pouch appears normal. 4. Bilateral pleural effusions.   Electronically Signed   By: Suzy Bouchard M.D.   On: 06/16/2015 10:57   Dg Abd 2 Views  06/15/2015   CLINICAL DATA:  Inpatient. Recent surgery. Status post Hartmann's for diverticulitis. Reflux.  EXAM: ABDOMEN - 2 VIEW  COMPARISON:  CT of the abdomen and pelvis 06/08/2015 and 06/06/2015  FINDINGS: Dilated small bowel loops are identified throughout the central abdomen. There is paucity of large bowel gas. Surgical clips overlie the lower central pelvis. Left lower quadrant ostomy. No free intraperitoneal air.  IMPRESSION: 1. Postoperative changes. 2. No evidence for free  intraperitoneal air. 3. Persistent small bowel dilatation consistent with ileus or obstruction.   Electronically Signed   By: Nolon Nations M.D.   On: 06/15/2015 14:06     Medications:   . Marland KitchenTPN (CLINIMIX-E) Adult 75 mL/hr at 06/15/15 2318  . Marland KitchenTPN (CLINIMIX-E) Adult    . sodium chloride 30 mL/hr at 06/15/15 2318  . amiodarone 30 mg/hr (06/16/15 0954)   . apixaban  5 mg Oral BID  . brimonidine  1 drop Both Eyes BID  . dorzolamide  1 drop Both Eyes BID  . furosemide  40 mg Intravenous Once  . hydrALAZINE  25 mg Oral 3 times per day  . hypromellose   Both Eyes QHS  . insulin aspart  0-9 Units Subcutaneous 4 times per day  . latanoprost  1 drop Both Eyes QHS  . methylPREDNISolone (SOLU-MEDROL) injection  60 mg Intravenous Q6H  . metoprolol tartrate  25 mg Oral BID  . piperacillin-tazobactam (ZOSYN)  IV  3.375 g Intravenous 3 times per day  . potassium chloride  10 mEq Intravenous Q1 Hr x 4  . timolol  1 drop Both Eyes BID   [DISCONTINUED] acetaminophen **OR** acetaminophen, alum & mag hydroxide-simeth, calcium carbonate, hydrALAZINE, HYDROmorphone (DILAUDID) injection, ipratropium-albuterol, LORazepam, menthol-cetylpyridinium, metoprolol, ondansetron **OR** ondansetron (ZOFRAN) IV, sodium chloride  Assessment/ Plan:  Mr. Alif Petrak is a 81 y.o. white male with hypertension, hyperlipidemia, peripheral neurpathy and glaucoma, was admitted to [facility] on 06/02/2015 with perforated diverticulits.   1. Acute renal failure on chronic kidney disease stage III with baseline creatinine of 1.7, eGFR of 38. CKD secondary to hypertension. CT without renal lesions - Continue to monitor urine output, volume status and renal function.  - on losartan as outpatient.   2. Hyponatremia and hypokalemia : stable at Na 128, potassium replaced - due to TPN.   3. Hypertension: holding losartan and hydrochlorothiazide.  - hydralazine and furosemide.    4. Anemia of CKD/blood loss: hemoglobin 9.5 - low  threshold to start epo.     LOS: [place], [place] 9/3/201611:58 AM

## 2015-06-16 NOTE — Consult Note (Signed)
ANTIBIOTIC CONSULT NOTE - FOLLOW UP  Pharmacy Consult for Zosyn Indication: Diverticulitis with perforation  No Known Allergies  Patient Measurements: Height: 5\' 5"  (165.1 cm) Weight: 156 lb 4.8 oz (70.897 kg) IBW/kg (Calculated) : 61.5  Vital Signs: Temp: 97.3 F (36.3 C) (09/03 0811) Temp Source: Oral (09/03 0811) BP: 126/60 mmHg (09/03 0811) Pulse Rate: 85 (09/03 0811)  Labs:  Recent Labs  06/14/15 0506 06/15/15 0441 06/15/15 1545 06/16/15 0450  WBC 19.9* 25.8*  --  29.9*  HGB 10.2* 10.5*  --  9.5*  PLT 271 300  --  315  LABCREA  --   --  62  --   CREATININE 1.62* 1.47*  --  1.32*   Estimated Creatinine Clearance: 33 mL/min (by C-G formula based on Cr of 1.32). No results for input(s): VANCOTROUGH, VANCOPEAK, VANCORANDOM, GENTTROUGH, GENTPEAK, GENTRANDOM, TOBRATROUGH, TOBRAPEAK, TOBRARND, AMIKACINPEAK, AMIKACINTROU, AMIKACIN in the last 72 hours.   Microbiology: Recent Results (from the past 720 hour(s))  MRSA PCR Screening     Status: None   Collection Time: 06/08/15 11:36 PM  Result Value Ref Range Status   MRSA by PCR NEGATIVE NEGATIVE Final    Comment:        The GeneXpert MRSA Assay (FDA approved for NASAL specimens only), is one component of a comprehensive MRSA colonization surveillance program. It is not intended to diagnose MRSA infection nor to guide or monitor treatment for MRSA infections.   Urine culture     Status: None (Preliminary result)   Collection Time: 06/15/15  3:45 PM  Result Value Ref Range Status   Specimen Description URINE, CLEAN CATCH  Final   Special Requests zosyn Normal  Final   Culture NO GROWTH < 24 HOURS  Final   Report Status PENDING  Incomplete  Culture, blood (routine x 2)     Status: None (Preliminary result)   Collection Time: 06/15/15  3:54 PM  Result Value Ref Range Status   Specimen Description BLOOD LEFT ASSIST CONTROL  Final   Special Requests BOTTLES DRAWN AEROBIC AND ANAEROBIC  6CC  Final   Culture NO  GROWTH < 24 HOURS  Final   Report Status PENDING  Incomplete  Culture, blood (routine x 2)     Status: None (Preliminary result)   Collection Time: 06/15/15  4:00 PM  Result Value Ref Range Status   Specimen Description BLOOD RIGHT ASSIST CONTROL  Final   Special Requests   Final    BOTTLES DRAWN AEROBIC AND ANAEROBIC  AER 113 CC ANA 11CC   Culture NO GROWTH < 24 HOURS  Final   Report Status PENDING  Incomplete    Anti-infectives    Start     Dose/Rate Route Frequency Ordered Stop   06/03/15 1000  piperacillin-tazobactam (ZOSYN) IVPB 3.375 g     3.375 g 12.5 mL/hr over 240 Minutes Intravenous 3 times per day 06/02/15 2348     06/03/15 0026  piperacillin-tazobactam (ZOSYN) 3.375 (3-0.375) G injection    Comments:  BRUMGARD, APRIL: cabinet override      06/03/15 0026 06/03/15 0146   06/02/15 2330  piperacillin-tazobactam (ZOSYN) IVPB 3.375 g     3.375 g 100 mL/hr over 30 Minutes Intravenous  Once 06/02/15 2316 06/03/15 0215      Assessment: 81 y/o M s/p Hartmann's procedure for perforated diverticuli on 8/27, currently on TPN Has been on zosyn since admission on 8/21, continued leukocytosis, unclear source. Blood and urine cultures with no growth to date. ID recommends continuing zosyn  Plan:  Will continue Zosyn 3.375 g EI q 8 hours  Pharmacy to follow per consult  Rexene Edison, PharmD Clinical Pharmacist  06/16/2015 9:21 AM

## 2015-06-16 NOTE — Progress Notes (Signed)
Patient has increase wob, lungs clear. However wheezing in neck noted. resp 25 patient currently on 02 at 2l. Respiratory called to evaluate patient and for their opinion. Patient given prn duoneb. Will continue to monitor closely [employer]

## 2015-06-16 NOTE — Progress Notes (Signed)
Spoke with dr. Rexene Edison to make aware ct scan in back, read results to md. No new orders at this time. md stated he will look at the results as well. Will continue to monitor [employer]

## 2015-06-16 NOTE — Progress Notes (Signed)
No nausea/vomiting during shift.  Pt had very little sputum production during shift.  Pt got one prn neb treatment at start of shift.  Pt having some wheezing this morning, treatment offered but pt refused.  Per pt request; he was repositioned and given oral care Q4hr. Pt complained of heart burn x 1, tums given--pt rested well after medication. Pts wife remained at bedside during shift.  Foley in place, labs drawn via central line.  TPN/saline/amio continued per scm orders.  Will continue to monitor.  Jessee Avers

## 2015-06-17 LAB — MAGNESIUM: Magnesium: 2.1 mg/dL (ref 1.7–2.4)

## 2015-06-17 LAB — GLUCOSE, CAPILLARY
GLUCOSE-CAPILLARY: 232 mg/dL — AB (ref 65–99)
GLUCOSE-CAPILLARY: 233 mg/dL — AB (ref 65–99)
GLUCOSE-CAPILLARY: 251 mg/dL — AB (ref 65–99)
Glucose-Capillary: 234 mg/dL — ABNORMAL HIGH (ref 65–99)

## 2015-06-17 LAB — BASIC METABOLIC PANEL
ANION GAP: 7 (ref 5–15)
BUN: 55 mg/dL — ABNORMAL HIGH (ref 6–20)
CO2: 26 mmol/L (ref 22–32)
Calcium: 8.8 mg/dL — ABNORMAL LOW (ref 8.9–10.3)
Chloride: 95 mmol/L — ABNORMAL LOW (ref 101–111)
Creatinine, Ser: 1.46 mg/dL — ABNORMAL HIGH (ref 0.61–1.24)
GFR calc non Af Amer: 41 mL/min — ABNORMAL LOW (ref >60)
GFR, EST AFRICAN AMERICAN: 47 mL/min — AB (ref >60)
Glucose, Bld: 230 mg/dL — ABNORMAL HIGH (ref 65–99)
POTASSIUM: 3.5 mmol/L (ref 3.5–5.1)
Sodium: 128 mmol/L — ABNORMAL LOW (ref 135–145)

## 2015-06-17 LAB — CBC WITH DIFFERENTIAL/PLATELET
Basophils Absolute: 0 10*3/uL (ref 0–0.1)
Basophils Relative: 0 %
EOS PCT: 0 %
Eosinophils Absolute: 0 10*3/uL (ref 0–0.7)
HEMATOCRIT: 27.5 % — AB (ref 40.0–52.0)
HEMOGLOBIN: 9.3 g/dL — AB (ref 13.0–18.0)
LYMPHS ABS: 0.7 10*3/uL — AB (ref 1.0–3.6)
LYMPHS PCT: 3 %
MCH: 31.1 pg (ref 26.0–34.0)
MCHC: 33.8 g/dL (ref 32.0–36.0)
MCV: 91.8 fL (ref 80.0–100.0)
Monocytes Absolute: 0.8 10*3/uL (ref 0.2–1.0)
Monocytes Relative: 3 %
NEUTROS ABS: 24 10*3/uL — AB (ref 1.4–6.5)
Neutrophils Relative %: 94 %
PLATELETS: 384 10*3/uL (ref 150–440)
RBC: 2.99 MIL/uL — AB (ref 4.40–5.90)
RDW: 15.3 % — ABNORMAL HIGH (ref 11.5–14.5)
WBC: 25.6 10*3/uL — AB (ref 3.8–10.6)

## 2015-06-17 LAB — URINE CULTURE
CULTURE: NO GROWTH
SPECIAL REQUESTS: NORMAL

## 2015-06-17 LAB — PHOSPHORUS: Phosphorus: 2.8 mg/dL (ref 2.5–4.6)

## 2015-06-17 MED ORDER — IPRATROPIUM-ALBUTEROL 0.5-2.5 (3) MG/3ML IN SOLN: 3.0000 mL | RESPIRATORY_TRACT | Status: DC | PRN

## 2015-06-17 MED ORDER — METHYLPREDNISOLONE SODIUM SUCC 125 MG IJ SOLR
60.0000 mg | Freq: Two times a day (BID) | INTRAMUSCULAR | Status: DC
  Administered 2015-06-17: 60 mg via INTRAVENOUS
  Filled 2015-06-17: qty 2

## 2015-06-17 MED ORDER — FUROSEMIDE 10 MG/ML IJ SOLN
40.0000 mg | Freq: Once | INTRAMUSCULAR | Status: AC
  Administered 2015-06-17: 40 mg via INTRAVENOUS
  Filled 2015-06-17: qty 4

## 2015-06-17 MED ORDER — POTASSIUM CHLORIDE 20 MEQ PO PACK
20.0000 meq | PACK | Freq: Once | ORAL | Status: AC
  Administered 2015-06-17: 20 meq via ORAL
  Filled 2015-06-17: qty 1

## 2015-06-17 MED ORDER — M.V.I. ADULT IV INJ
INJECTION | INTRAVENOUS | Status: AC
  Administered 2015-06-17: 19:00:00 via INTRAVENOUS
  Filled 2015-06-17: qty 1800

## 2015-06-17 NOTE — Progress Notes (Signed)
Surgery Progress Note  S: Feels better.  Less spitting up/reflux, breathing improved O:Blood pressure 135/60, pulse 84, temperature 97.4 F (36.3 C), temperature source Oral, resp. rate 21, height 5\' 5"  (1.651 m), weight 154 lb (69.854 kg), SpO2 98 %. GEN: NAD/A&Ox3 ABD: soft, mild distention, nontender, ostomy pink, incision c/d/i with drains  WBC 25  A/P 81 yo s/p ex lap, hartmanns for diverticulitis, doing well - Ice chips - OOB to chair

## 2015-06-17 NOTE — Progress Notes (Signed)
[place] [facility] [place] NAME: Jesse Macias    MR#:  [id number]  DATE OF BIRTH:  [birth year]  SUBJECTIVE:  Sodium 128, leukocytosis worsening.  Wife at bedside.  Breathing much improved.  No wheezing REVIEW OF SYSTEMS:    Review of Systems  Constitutional: Negative for fever, chills and malaise/fatigue.  HENT: Negative for sore throat.   Eyes: Negative for blurred vision.  Respiratory: Positive for cough. Negative for hemoptysis, shortness of breath and wheezing.   Cardiovascular: Negative for chest pain, palpitations and leg swelling.  Gastrointestinal: Positive for abdominal pain. Negative for nausea, vomiting, diarrhea and blood in stool.  Genitourinary: Negative for dysuria.  Musculoskeletal: Negative for back pain.  Neurological: Negative for dizziness, tremors and headaches.  Endo/Heme/Allergies: Does not bruise/bleed easily.    Tolerating Diet: On TPN DRUG ALLERGIES:  No Known Allergies VITALS:  Blood pressure 135/60, pulse 84, temperature 97.4 F (36.3 C), temperature source Oral, resp. rate 21, height 5\' 5"  (1.651 m), weight 69.854 kg (154 lb), SpO2 98 %. PHYSICAL EXAMINATION:   Physical Exam  Constitutional: He is well-developed, well-nourished, and in no distress. No distress.  HENT:  Head: Normocephalic.  Eyes: No scleral icterus.  Neck: Normal range of motion. Neck supple. No JVD present. No tracheal deviation present.  Cardiovascular: Normal rate, regular rhythm and normal heart sounds.  Exam reveals no gallop and no friction rub.   No murmur heard. Pulmonary/Chest: No accessory muscle usage. No tachypnea. No respiratory distress. He has decreased breath sounds. He has no wheezes. He has no rales. He exhibits no tenderness.  Abdominal: Soft. He exhibits no distension and no mass. There is tenderness. There is no rebound and no guarding.  Penrose drain  Musculoskeletal: Normal range of motion. He exhibits no edema.   Skin: Skin is warm. No rash noted. No erythema.  Psychiatric: Affect and judgment normal.   LABORATORY PANEL:   CBC  Recent Labs Lab 06/17/15 0625  WBC 25.6*  HGB 9.3*  HCT 27.5*  PLT 384   ------------------------------------------------------------------------------------------------------------------  Chemistries   Recent Labs Lab 06/17/15 0625  NA 128*  K 3.5  CL 95*  CO2 26  GLUCOSE 230*  BUN 55*  CREATININE 1.46*  CALCIUM 8.8*  MG 2.1   ------------------------------------------------------------------------------------------------------------------  Cardiac Enzymes No results for input(s): TROPONINI in the last 168 hours. ------------------------------------------------------------------------------------------------------------------  RADIOLOGY:  Ct Abdomen Pelvis Wo Contrast  06/16/2015   CLINICAL DATA:  Elevated white blood cell count. History of perforated diverticulitis with Hartmann's pouch 06/09/2015. LEFT lower quadrant colostomy.  EXAM: CT ABDOMEN AND PELVIS WITHOUT CONTRAST  TECHNIQUE: Multidetector CT imaging of the abdomen and pelvis was performed following the standard protocol without IV contrast.  COMPARISON:  CT 06/10/2015  FINDINGS: Lower chest: Mild bibasilar effusions.  Hepatobiliary: Small hepatic cysts in the caudate lobe. No biliary duct dilatation. The gallbladder is normal.  Pancreas: Pancreas is normal. No ductal dilatation. No pancreatic inflammation.  Spleen: Normal spleen  Adrenals/urinary tract: Adrenal glands are normal. Simple fluid attenuation lesion upper pole of the RIGHT kidney. No renal obstruction. Foley catheter within the bladder. Small amount a gas within the bladder likely related to catheterization.  Stomach/Bowel: Stomach and duodenum are normal. The proximal small bowel mildly dilated 3.4 cm. There is poor progression of the oral contrast. The distal small bowel is collapsed over a fairly long segment leading up to the  terminal ileum. No transition point identified. The ascending transverse and proximal descending colon are  collapsed. Colostomy appears normal in LEFT lower quadrant. Hartmann's pouch is intact.  Small amount fluid in LEFT lower quadrant along of small bowel loops (image 62, series 2). There is mild enhancement of the peritoneal surface at this level. This fluid collection is not well organized. This is site of perforated diverticulitis on comparison CT  Vascular/Lymphatic: Abdominal aorta is normal caliber with atherosclerotic calcification. There is no retroperitoneal or periportal lymphadenopathy. No pelvic lymphadenopathy.  Reproductive: Prostate normal.  Musculoskeletal: No aggressive osseous lesion.  Other: There is a midline surgical drain extending vertically within the subcutaneous tissue. No evidence abscess.  IMPRESSION: 1. Poor progression of the oral contrast through the small bowel with a caliber change from mildly dilated proximal small bowel to collapsed distal small bowel and colon. Favor small bowel ileus over obstruction however consider follow-up radiographs to evaluate for potential developing obstruction. 2. Small amount free fluid in the LEFT lower quadrant does have thin enhancing rim but is not well organized. The findings suggest mild peritonitis over abscess. Recommend attention on follow-up. This was the site of perforated diverticulitis. 3. Hartmann's pouch appears normal. 4. Bilateral pleural effusions.   Electronically Signed   By: Suzy Bouchard M.D.   On: 06/16/2015 10:57   Dg Abd 2 Views  06/15/2015   CLINICAL DATA:  Inpatient. Recent surgery. Status post Hartmann's for diverticulitis. Reflux.  EXAM: ABDOMEN - 2 VIEW  COMPARISON:  CT of the abdomen and pelvis 06/08/2015 and 06/06/2015  FINDINGS: Dilated small bowel loops are identified throughout the central abdomen. There is paucity of large bowel gas. Surgical clips overlie the lower central pelvis. Left lower quadrant ostomy.  No free intraperitoneal air.  IMPRESSION: 1. Postoperative changes. 2. No evidence for free intraperitoneal air. 3. Persistent small bowel dilatation consistent with ileus or obstruction.   Electronically Signed   By: Nolon Nations M.D.   On: 06/15/2015 14:06   ASSESSMENT AND PLAN:  81 year old male with recurrent diverticulitis status post Hartmann procedure.  1. Rapid a.fib: now Heart rate is better controlled. Cardiology seen and following. Elquis is restarted. Pharmacy is assisting with this. continue metoprolol for rate control and amiodarone for now. Once patient is eating amiodarone can be changed to oral.  2. Uncontrolled hypertension: Patient's blood pressure has much improved. Continue metoprolol, HCTZ and hydralazine.  3. Hyponatremia: 128, appreciate nephrology input for fluid and electrolyte management, on normal saline at 30 cc an hour  4. Acute respiratory failure: muc better today - Likely from bilateral pleural effusion, and fluid overload.  Try 1 more dose of Lasix.  tapering IV steroids - changed to BID,  nebulizer to be schedule every 4 hours for now.  He is +15.9 L.   5. Perforated diverticulitis: Patient is status post Hartman's procedure. As per surgery.    6.  Worsening leukocytosis: Appreciate infectious disease input, worsening white count, patient is afebrile.  Continue Zosyn.  This could be from steroids  7. Acute renal failure: Improved with IV fluids.  Nephrology consultation appreciated   8. Glaucoma: Continue atenolol and XALATAN eyedrops   Management plans discussed with the patient, Dr. Rexene Edison , family and they are in agreement.  CODE STATUS: FULL  TOTAL TIME TAKING CARE OF THIS PATIENT: 35 minutes.   Discontinue telemetry   D/C plans per primary team.   Max Sane M.D on 06/17/2015 at 11:38 AM  Between 7am to 6pm - Pager - [phone] After 6pm go to [email or web address] - password [email or web address] [facility]  Office   [phone]  CC:  Primary care physician; No primary care provider on file.

## 2015-06-17 NOTE — Progress Notes (Signed)
[facility]  ROUNDING NOTE   Subjective:   Wife at bedside. Agitated overnight. Alert and oriented today.  Creatinine 1.46 (1.28) Na 128  Objective:  Vital signs in last 24 hours:  Temp:  [97 F (36.1 C)-98.1 F (36.7 C)] 97.4 F (36.3 C) (09/04 0734) Pulse Rate:  [75-84] 84 (09/04 0734) Resp:  [18-21] 21 (09/04 0734) BP: (116-143)/(59-61) 135/60 mmHg (09/04 0734) SpO2:  [96 %-100 %] 98 % (09/04 0749) Weight:  [69.854 kg (154 lb)] 69.854 kg (154 lb) (09/04 0612)  Weight change: -1.043 kg (-2 lb 4.8 oz) Filed Weights   06/15/15 0440 06/16/15 0420 06/17/15 0612  Weight: 72.122 kg (159 lb) 70.897 kg (156 lb 4.8 oz) 69.854 kg (154 lb)    Intake/Output: I/O last 3 completed shifts: In: 4810.1 [I.V.:1456.4; IV Piggyback:750] Out: 2725 [Urine:2550; Stool:175]   Intake/Output this shift:  Total I/O In: 231.9 [I.V.:231.9] Out: 75 [Stool:75]  Physical Exam: General: NAD, elderly, fraile  Head: Normocephalic, atraumatic. Dry oral mucosal membranes  Eyes: Anicteric, PERRL  Neck: Supple, trachea midline  Lungs:  Clear to auscultation  Heart: Regular rate and rhythm  Abdomen:  Soft, nontender, mild distention  Extremities: no peripheral edema.  Neurologic: Nonfocal, moving all four extremities  Skin: No lesions  GU +foley    Basic Metabolic Panel:  Recent Labs Lab 06/13/15 0439  06/14/15 0506 06/15/15 0441 06/16/15 0450 06/16/15 1243 06/17/15 0625  NA 132*  < > 130* 128* 128* 125* 128*  K 3.4*  < > 4.1 3.4* 3.3* 4.0 3.5  CL 97*  < > 98* 96* 97* 95* 95*  CO2 28  < > 26 25 26 24 26   GLUCOSE 144*  < > 134* 164* 157* 205* 230*  BUN 37*  < > 43* 44* 44* 48* 55*  CREATININE 1.47*  < > 1.62* 1.47* 1.32* 1.28* 1.46*  CALCIUM 8.6*  < > 8.6* 8.5* 8.4* 8.3* 8.8*  MG 1.5*  --  2.2 1.8 1.6*  --  2.1  PHOS 3.3  --  2.4* 2.8 2.5  --  2.8  < > = values in this interval not displayed.  Liver Function Tests: No results for input(s): AST, ALT, ALKPHOS, BILITOT,  PROT, ALBUMIN in the last 168 hours. No results for input(s): LIPASE, AMYLASE in the last 168 hours. No results for input(s): AMMONIA in the last 168 hours.  CBC:  Recent Labs Lab 06/13/15 0439 06/14/15 0506 06/15/15 0441 06/16/15 0450 06/17/15 0625  WBC 24.7* 19.9* 25.8* 29.9* 25.6*  NEUTROABS 21.0* 16.1* 23.0* 26.2* 24.0*  HGB 11.4* 10.2* 10.5* 9.5* 9.3*  HCT 33.1* 30.2* 31.3* 27.9* 27.5*  MCV 94.3 92.8 92.9 92.0 91.8  PLT 256 271 300 315 384    Cardiac Enzymes: No results for input(s): CKTOTAL, CKMB, CKMBINDEX, TROPONINI in the last 168 hours.  BNP: Invalid input(s): POCBNP  CBG:  Recent Labs Lab 06/16/15 0456 06/16/15 1204 06/16/15 1749 06/17/15 0001 06/17/15 0603  GLUCAP 154* 193* 242* 233* 251*    Microbiology: Results for orders placed or performed during the hospital encounter of 06/02/15  MRSA PCR Screening     Status: None   Collection Time: 06/08/15 11:36 PM  Result Value Ref Range Status   MRSA by PCR NEGATIVE NEGATIVE Final    Comment:        The GeneXpert MRSA Assay (FDA approved for NASAL specimens only), is one component of a comprehensive MRSA colonization surveillance program. It is not intended to diagnose MRSA infection nor to  guide or monitor treatment for MRSA infections.   Urine culture     Status: None (Preliminary result)   Collection Time: 06/15/15  3:45 PM  Result Value Ref Range Status   Specimen Description URINE, CLEAN CATCH  Final   Special Requests zosyn Normal  Final   Culture NO GROWTH < 24 HOURS  Final   Report Status PENDING  Incomplete  Culture, blood (routine x 2)     Status: None (Preliminary result)   Collection Time: 06/15/15  3:54 PM  Result Value Ref Range Status   Specimen Description BLOOD LEFT ASSIST CONTROL  Final   Special Requests BOTTLES DRAWN AEROBIC AND ANAEROBIC  6CC  Final   Culture NO GROWTH 2 DAYS  Final   Report Status PENDING  Incomplete  Culture, blood (routine x 2)     Status: None  (Preliminary result)   Collection Time: 06/15/15  4:00 PM  Result Value Ref Range Status   Specimen Description BLOOD RIGHT ASSIST CONTROL  Final   Special Requests   Final    BOTTLES DRAWN AEROBIC AND ANAEROBIC  AER 113 CC ANA 11CC   Culture NO GROWTH 2 DAYS  Final   Report Status PENDING  Incomplete    Coagulation Studies: No results for input(s): LABPROT, INR in the last 72 hours.  Urinalysis:  Recent Labs  06/15/15 1545  COLORURINE YELLOW*  LABSPEC 1.021  PHURINE 5.0  GLUCOSEU 150*  HGBUR 1+*  BILIRUBINUR NEGATIVE  KETONESUR NEGATIVE  PROTEINUR 30*  NITRITE NEGATIVE  LEUKOCYTESUR NEGATIVE      Imaging: Ct Abdomen Pelvis Wo Contrast  06/16/2015   CLINICAL DATA:  Elevated white blood cell count. History of perforated diverticulitis with Hartmann's pouch 06/09/2015. LEFT lower quadrant colostomy.  EXAM: CT ABDOMEN AND PELVIS WITHOUT CONTRAST  TECHNIQUE: Multidetector CT imaging of the abdomen and pelvis was performed following the standard protocol without IV contrast.  COMPARISON:  CT 06/10/2015  FINDINGS: Lower chest: Mild bibasilar effusions.  Hepatobiliary: Small hepatic cysts in the caudate lobe. No biliary duct dilatation. The gallbladder is normal.  Pancreas: Pancreas is normal. No ductal dilatation. No pancreatic inflammation.  Spleen: Normal spleen  Adrenals/urinary tract: Adrenal glands are normal. Simple fluid attenuation lesion upper pole of the RIGHT kidney. No renal obstruction. Foley catheter within the bladder. Small amount a gas within the bladder likely related to catheterization.  Stomach/Bowel: Stomach and duodenum are normal. The proximal small bowel mildly dilated 3.4 cm. There is poor progression of the oral contrast. The distal small bowel is collapsed over a fairly long segment leading up to the terminal ileum. No transition point identified. The ascending transverse and proximal descending colon are collapsed. Colostomy appears normal in LEFT lower quadrant.  Hartmann's pouch is intact.  Small amount fluid in LEFT lower quadrant along of small bowel loops (image 62, series 2). There is mild enhancement of the peritoneal surface at this level. This fluid collection is not well organized. This is site of perforated diverticulitis on comparison CT  Vascular/Lymphatic: Abdominal aorta is normal caliber with atherosclerotic calcification. There is no retroperitoneal or periportal lymphadenopathy. No pelvic lymphadenopathy.  Reproductive: Prostate normal.  Musculoskeletal: No aggressive osseous lesion.  Other: There is a midline surgical drain extending vertically within the subcutaneous tissue. No evidence abscess.  IMPRESSION: 1. Poor progression of the oral contrast through the small bowel with a caliber change from mildly dilated proximal small bowel to collapsed distal small bowel and colon. Favor small bowel ileus over obstruction however consider  follow-up radiographs to evaluate for potential developing obstruction. 2. Small amount free fluid in the LEFT lower quadrant does have thin enhancing rim but is not well organized. The findings suggest mild peritonitis over abscess. Recommend attention on follow-up. This was the site of perforated diverticulitis. 3. Hartmann's pouch appears normal. 4. Bilateral pleural effusions.   Electronically Signed   By: Suzy Bouchard M.D.   On: 06/16/2015 10:57   Dg Abd 2 Views  06/15/2015   CLINICAL DATA:  Inpatient. Recent surgery. Status post Hartmann's for diverticulitis. Reflux.  EXAM: ABDOMEN - 2 VIEW  COMPARISON:  CT of the abdomen and pelvis 06/08/2015 and 06/06/2015  FINDINGS: Dilated small bowel loops are identified throughout the central abdomen. There is paucity of large bowel gas. Surgical clips overlie the lower central pelvis. Left lower quadrant ostomy. No free intraperitoneal air.  IMPRESSION: 1. Postoperative changes. 2. No evidence for free intraperitoneal air. 3. Persistent small bowel dilatation consistent with  ileus or obstruction.   Electronically Signed   By: Nolon Nations M.D.   On: 06/15/2015 14:06     Medications:   . Marland KitchenTPN (CLINIMIX-E) Adult 75 mL/hr at 06/17/15 0654  . Marland KitchenTPN (CLINIMIX-E) Adult    . sodium chloride 30 mL/hr at 06/17/15 0654  . amiodarone 30 mg/hr (06/16/15 2244)   . apixaban  5 mg Oral BID  . brimonidine  1 drop Both Eyes BID  . dorzolamide  1 drop Both Eyes BID  . hydrALAZINE  25 mg Oral 3 times per day  . hypromellose   Both Eyes QHS  . insulin aspart  0-9 Units Subcutaneous 4 times per day  . ipratropium-albuterol  3 mL Nebulization Q4H  . latanoprost  1 drop Both Eyes QHS  . methylPREDNISolone (SOLU-MEDROL) injection  60 mg Intravenous Q6H  . metoprolol tartrate  25 mg Oral BID  . piperacillin-tazobactam (ZOSYN)  IV  3.375 g Intravenous 3 times per day  . timolol  1 drop Both Eyes BID   [DISCONTINUED] acetaminophen **OR** acetaminophen, alum & mag hydroxide-simeth, calcium carbonate, hydrALAZINE, HYDROmorphone (DILAUDID) injection, LORazepam, menthol-cetylpyridinium, metoprolol, ondansetron **OR** ondansetron (ZOFRAN) IV, sodium chloride  Assessment/ Plan:  [name] is a 81 y.o. white male with hypertension, hyperlipidemia, peripheral neurpathy and glaucoma, was admitted to [facility] on 06/02/2015 with perforated diverticulits.   1. Acute renal failure on chronic kidney disease stage III with baseline creatinine of 1.7, eGFR of 38. CKD secondary to hypertension. CT without renal lesions - Continue to monitor urine output, volume status and renal function.  - on losartan as outpatient. Holding currently  2. Hyponatremia and hypokalemia : stable at Na 128, potassium replaced - due to TPN.   3. Hypertension: holding losartan and hydrochlorothiazide.  - hydralazine and furosemide.    4. Anemia of CKD/blood loss: hemoglobin 9.3 - low threshold to start epo.     LOS: 15 Skiler Tye 9/4/201610:19 AM

## 2015-06-17 NOTE — Progress Notes (Signed)
PARENTERAL NUTRITION CONSULT NOTE - Follow up  Pharmacy Consult for Electrolyte/Glucose Management Indication: TPN  No Known Allergies  Patient Measurements: Height: 5\' 5"  (165.1 cm) Weight: 154 lb (69.854 kg) IBW/kg (Calculated) : 61.5  Vital Signs: Temp: 97.4 F (36.3 C) (09/04 0734) Temp Source: Oral (09/04 0734) BP: 135/60 mmHg (09/04 0734) Pulse Rate: 84 (09/04 0734) Intake/Output from previous day: 09/03 0701 - 09/04 0700 In: 4187.1 [I.V.:1255.8; IV Piggyback:650; TPN:2281.3] Out: 2000 [Urine:1900; Stool:100] Intake/Output from this shift: Total I/O In: 231.9 [I.V.:231.9] Out: -   Labs:  Recent Labs  06/15/15 0441 06/16/15 0450 06/17/15 0625  WBC 25.8* 29.9* 25.6*  HGB 10.5* 9.5* 9.3*  HCT 31.3* 27.9* 27.5*  PLT 300 315 384     Recent Labs  06/15/15 0441 06/15/15 1545 06/16/15 0450 06/16/15 1243 06/17/15 0625  NA 128*  --  128* 125* 128*  K 3.4*  --  3.3* 4.0 3.5  CL 96*  --  97* 95* 95*  CO2 25  --  26 24 26   GLUCOSE 164*  --  157* 205* 230*  BUN 44*  --  44* 48* 55*  CREATININE 1.47*  --  1.32* 1.28* 1.46*  LABCREA  --  62  --   --   --   CALCIUM 8.5*  --  8.4* 8.3* 8.8*  MG 1.8  --  1.6*  --  2.1  PHOS 2.8  --  2.5  --  2.8   Estimated Creatinine Clearance: 29.8 mL/min (by C-G formula based on Cr of 1.46).    Recent Labs  06/16/15 1749 06/17/15 0001 06/17/15 0603  GLUCAP 242* 233* 251*    Medical History: Past Medical History  Diagnosis Date  . Hypertension   . History of hiatal hernia     Medications:  Infusions:  . Marland KitchenTPN (CLINIMIX-E) Adult 75 mL/hr at 06/17/15 0654  . sodium chloride 30 mL/hr at 06/17/15 0654  . amiodarone 30 mg/hr (06/16/15 2244)    Assessment: Pharmacy consulted to manage electrolytes and glucose in this 81 year old male on TPN.   Current orders for Clinimix E 5/20 at 29ml/hr  24 hr SSI use: 13 units, FSBG: 193-251  Plan:  Electrolytes WNL, potassium lower limit of normal, has been requiring  daily supplementation. Will give 17mEq oral and recheck electrolytes with AM labs.  13 units of SSI over 24 hours, patient started on solumedrol 60mg  IV Q6H 9/3. Will continue SSI and Q6H accuchecks. May need to add basal insulin if steroids continued.   Rexene Edison, PharmD Clinical Pharmacist   06/17/2015 8:15 AM

## 2015-06-18 LAB — CBC WITH DIFFERENTIAL/PLATELET
BASOS ABS: 0 10*3/uL (ref 0–0.1)
BASOS PCT: 0 %
EOS ABS: 0 10*3/uL (ref 0–0.7)
EOS PCT: 0 %
HCT: 27 % — ABNORMAL LOW (ref 40.0–52.0)
Hemoglobin: 9.1 g/dL — ABNORMAL LOW (ref 13.0–18.0)
Lymphocytes Relative: 3 %
Lymphs Abs: 0.8 10*3/uL — ABNORMAL LOW (ref 1.0–3.6)
MCH: 31.1 pg (ref 26.0–34.0)
MCHC: 33.7 g/dL (ref 32.0–36.0)
MCV: 92.4 fL (ref 80.0–100.0)
MONO ABS: 1.5 10*3/uL — AB (ref 0.2–1.0)
Monocytes Relative: 6 %
Neutro Abs: 23.2 10*3/uL — ABNORMAL HIGH (ref 1.4–6.5)
Neutrophils Relative %: 91 %
PLATELETS: 440 10*3/uL (ref 150–440)
RBC: 2.92 MIL/uL — AB (ref 4.40–5.90)
RDW: 16 % — AB (ref 11.5–14.5)
WBC: 25.5 10*3/uL — AB (ref 3.8–10.6)

## 2015-06-18 LAB — BASIC METABOLIC PANEL
Anion gap: 7 (ref 5–15)
BUN: 64 mg/dL — AB (ref 6–20)
CHLORIDE: 97 mmol/L — AB (ref 101–111)
CO2: 26 mmol/L (ref 22–32)
CREATININE: 1.74 mg/dL — AB (ref 0.61–1.24)
Calcium: 8.6 mg/dL — ABNORMAL LOW (ref 8.9–10.3)
GFR calc non Af Amer: 33 mL/min — ABNORMAL LOW (ref >60)
GFR, EST AFRICAN AMERICAN: 38 mL/min — AB (ref >60)
Glucose, Bld: 201 mg/dL — ABNORMAL HIGH (ref 65–99)
Potassium: 3.3 mmol/L — ABNORMAL LOW (ref 3.5–5.1)
SODIUM: 130 mmol/L — AB (ref 135–145)

## 2015-06-18 LAB — PHOSPHORUS: Phosphorus: 3.6 mg/dL (ref 2.5–4.6)

## 2015-06-18 LAB — GLUCOSE, CAPILLARY
GLUCOSE-CAPILLARY: 192 mg/dL — AB (ref 65–99)
GLUCOSE-CAPILLARY: 193 mg/dL — AB (ref 65–99)
GLUCOSE-CAPILLARY: 231 mg/dL — AB (ref 65–99)
Glucose-Capillary: 228 mg/dL — ABNORMAL HIGH (ref 65–99)

## 2015-06-18 LAB — MAGNESIUM: MAGNESIUM: 2.1 mg/dL (ref 1.7–2.4)

## 2015-06-18 MED ORDER — TRACE MINERALS CR-CU-MN-SE-ZN 10-1000-500-60 MCG/ML IV SOLN
INTRAVENOUS | Status: AC
  Administered 2015-06-19: via INTRAVENOUS
  Filled 2015-06-18 (×2): qty 1800

## 2015-06-18 MED ORDER — POTASSIUM CHLORIDE 20 MEQ PO PACK
20.0000 meq | PACK | Freq: Once | ORAL | Status: AC
  Administered 2015-06-18: 20 meq via ORAL
  Filled 2015-06-18: qty 1

## 2015-06-18 MED ORDER — FUROSEMIDE 10 MG/ML IJ SOLN
40.0000 mg | Freq: Once | INTRAMUSCULAR | Status: AC
  Administered 2015-06-18: 40 mg via INTRAVENOUS
  Filled 2015-06-18: qty 4

## 2015-06-18 MED ORDER — METHYLPREDNISOLONE SODIUM SUCC 125 MG IJ SOLR
60.0000 mg | Freq: Two times a day (BID) | INTRAMUSCULAR | Status: DC
  Administered 2015-06-18 (×2): 60 mg via INTRAVENOUS
  Filled 2015-06-18 (×2): qty 2

## 2015-06-18 MED ORDER — FAT EMULSION 20 % IV EMUL
500.0000 mL | INTRAVENOUS | Status: DC
  Administered 2015-06-19: 500 mL via INTRAVENOUS
  Filled 2015-06-18 (×5): qty 500

## 2015-06-18 MED ORDER — METHYLPREDNISOLONE SODIUM SUCC 125 MG IJ SOLR: 60.0000 mg | Freq: Every day | INTRAMUSCULAR | Status: DC

## 2015-06-18 NOTE — Progress Notes (Addendum)
Nutrition Follow-up    INTERVENTION:   PN: recommend continuing 5%AA/20%Dextrose at rate of 75 ml/hr, 20% Lipids ordered to infuse tomorrow  NUTRITION DIAGNOSIS:   Inadequate oral intake related to acute illness, altered GI function as evidenced by  (NPO/CL since admission). Being addressed via TPN   GOAL:   Patient will meet greater than or equal to 90% of their needs  MONITOR:    (Energy Intake, Digestive System, Electrolyte/Renal Profile, Anthropometrics)  REASON FOR ASSESSMENT:   Consult New TPN/TNA   ASSESSMENT:   Breathing improved, remains on TPN. Sodium trend improving, leukocytosis presennt but afebrile  Diet Order:  Diet NPO time specified Except for: Sips with Meds .TPN (CLINIMIX-E) Adult .TPN (CLINIMIX-E) Adult   PN: tolerating 5%AA/20%Dextrose at rate of 75 ml/hr  Energy Intake: allowed ice chips  Digestive System: +some stool via colostomy, ostomy pink  Skin:  Reviewed, no issues  Electrolyte and Renal Profile:  Recent Labs Lab 06/16/15 0450 06/16/15 1243 06/17/15 0625 06/18/15 0740  BUN 44* 48* 55* 64*  CREATININE 1.32* 1.28* 1.46* 1.74*  NA 128* 125* 128* 130*  K 3.3* 4.0 3.5 3.3*  MG 1.6*  --  2.1 2.1  PHOS 2.5  --  2.8 3.6   Glucose Profile:  Recent Labs  06/17/15 1810 06/18/15 0011 06/18/15 0502  GLUCAP 234* 231* 192*   Meds: ss novolog, solumedrol, NS at 30 ml/hr  Urine Volume: UOP 1775 mL in past 24 hours  Height:   Ht Readings from Last 1 Encounters:  06/03/15 5\' 5"  (1.651 m)    Weight: weight relatively stable  Wt Readings from Last 1 Encounters:  06/18/15 155 lb 1.6 oz (70.353 kg)   Filed Weights   06/16/15 0420 06/17/15 0612 06/18/15 0504  Weight: 156 lb 4.8 oz (70.897 kg) 154 lb (69.854 kg) 155 lb 1.6 oz (70.353 kg)    BMI:  Body mass index is 25.81 kg/(m^2).  Estimated Nutritional Needs:   Kcal:  1709-2019 kcals (1195, 1.3 AF, 1.1-1.3 IF)   Protein:  67-85 g (1.1-1.4 g/kg)   Fluid:  1525-1830 mL  (25-30 ml/kg)   EDUCATION NEEDS:   No education needs identified at this time  [place], RD, LDN [phone] Pager

## 2015-06-18 NOTE — Progress Notes (Signed)
PARENTERAL NUTRITION CONSULT NOTE - Follow up  Pharmacy Consult for Electrolyte/Glucose Management Indication: TPN  No Known Allergies  Patient Measurements: Height: 5\' 5"  (165.1 cm) Weight: 155 lb 1.6 oz (70.353 kg) IBW/kg (Calculated) : 61.5  Vital Signs: Temp: 97.9 F (36.6 C) (09/05 0504) Temp Source: Oral (09/05 0504) BP: 144/71 mmHg (09/05 0940) Pulse Rate: 79 (09/05 0940) Intake/Output from previous day: 09/04 0701 - 09/05 0700 In: 1529.8 [I.V.:703.5; IV Piggyback:50; TPN:776.3] Out: 1875 [Urine:1775; Stool:100] Intake/Output from this shift:    Labs:  Recent Labs  06/16/15 0450 06/17/15 0625 06/18/15 0740  WBC 29.9* 25.6* 25.5*  HGB 9.5* 9.3* 9.1*  HCT 27.9* 27.5* 27.0*  PLT 315 384 440     Recent Labs  06/15/15 1545  06/16/15 0450 06/16/15 1243 06/17/15 0625 06/18/15 0740  NA  --   < > 128* 125* 128* 130*  K  --   < > 3.3* 4.0 3.5 3.3*  CL  --   < > 97* 95* 95* 97*  CO2  --   < > 26 24 26 26   GLUCOSE  --   < > 157* 205* 230* 201*  BUN  --   < > 44* 48* 55* 64*  CREATININE  --   < > 1.32* 1.28* 1.46* 1.74*  LABCREA 62  --   --   --   --   --   CALCIUM  --   < > 8.4* 8.3* 8.8* 8.6*  MG  --   --  1.6*  --  2.1 2.1  PHOS  --   --  2.5  --  2.8 3.6  < > = values in this interval not displayed. Estimated Creatinine Clearance: 25 mL/min (by C-G formula based on Cr of 1.74).    Recent Labs  06/17/15 1810 06/18/15 0011 06/18/15 0502  GLUCAP 234* 231* 192*    Medical History: Past Medical History  Diagnosis Date  . Hypertension   . History of hiatal hernia     Medications:  Infusions:  . Marland KitchenTPN (CLINIMIX-E) Adult 75 mL/hr at 06/17/15 1847  . sodium chloride 30 mL/hr at 06/18/15 [id number]    Assessment: Pharmacy consulted to manage electrolytes and glucose in this 81 year old male on TPN.   Current orders for Clinimix E 5/20 at 70ml/hr  Plan:  Electrolytes WNL, potassium lower limit of normal, has been requiring daily supplementation.  Will give 10mEq oral and recheck electrolytes with AM labs.  11 units of SSI over 24 hours. Patient is ordered Solu-Medrol 60mg  IV Q12H. Will continue SSI and Q6H accuchecks. May need to add basal insulin if steroids continued.   Olivia Canter, [facility] Clinical Pharmacist   06/18/2015, 9:47 AM

## 2015-06-18 NOTE — Progress Notes (Signed)
[place] at [place] NAME: Jesse Macias    MR#:  [id number]  DATE OF BIRTH:  [birth year]  SUBJECTIVE:  Sodium 130, leukocytosis same but afebrile.  Wife at bedside.  Breathing much improved.  minimal wheezing. Slept very well and looking very good this am, HR also controlled REVIEW OF SYSTEMS:    Review of Systems  Constitutional: Negative for fever, chills and malaise/fatigue.  HENT: Negative for sore throat.   Eyes: Negative for blurred vision.  Respiratory: Positive for cough and shortness of breath. Negative for hemoptysis and wheezing.   Cardiovascular: Negative for chest pain, palpitations and leg swelling.  Gastrointestinal: Negative for nausea, vomiting, abdominal pain, diarrhea and blood in stool.  Genitourinary: Negative for dysuria.  Musculoskeletal: Negative for back pain.  Neurological: Negative for dizziness, tremors and headaches.  Endo/Heme/Allergies: Does not bruise/bleed easily.    Tolerating Diet: On TPN DRUG ALLERGIES:  No Known Allergies VITALS:  Blood pressure 132/59, pulse 78, temperature 97.9 F (36.6 C), temperature source Oral, resp. rate 18, height 5\' 5"  (1.651 m), weight 70.353 kg (155 lb 1.6 oz), SpO2 98 %. PHYSICAL EXAMINATION:   Physical Exam  Constitutional: He is well-developed, well-nourished, and in no distress. No distress.  HENT:  Head: Normocephalic.  Eyes: No scleral icterus.  Neck: Normal range of motion. Neck supple. No JVD present. No tracheal deviation present.  Cardiovascular: Normal rate, regular rhythm and normal heart sounds.  Exam reveals no gallop and no friction rub.   No murmur heard. Pulmonary/Chest: No accessory muscle usage. No tachypnea. No respiratory distress. He has decreased breath sounds. He has no wheezes. He has no rales. He exhibits no tenderness.  Abdominal: Soft. He exhibits no distension and no mass. There is tenderness. There is no rebound and no guarding.   Penrose drain  Musculoskeletal: Normal range of motion. He exhibits no edema.  Skin: Skin is warm. No rash noted. No erythema.  Psychiatric: Affect and judgment normal.   LABORATORY PANEL:   CBC  Recent Labs Lab 06/18/15 0740  WBC 25.5*  HGB 9.1*  HCT 27.0*  PLT 440   ------------------------------------------------------------------------------------------------------------------  Chemistries   Recent Labs Lab 06/18/15 0740  NA 130*  K 3.3*  CL 97*  CO2 26  GLUCOSE 201*  BUN 64*  CREATININE 1.74*  CALCIUM 8.6*  MG 2.1   ------------------------------------------------------------------------------------------------------------------  Cardiac Enzymes No results for input(s): TROPONINI in the last 168 hours. ------------------------------------------------------------------------------------------------------------------  RADIOLOGY:  Ct Abdomen Pelvis Wo Contrast  06/16/2015   CLINICAL DATA:  Elevated white blood cell count. History of perforated diverticulitis with Hartmann's pouch 06/09/2015. LEFT lower quadrant colostomy.  EXAM: CT ABDOMEN AND PELVIS WITHOUT CONTRAST  TECHNIQUE: Multidetector CT imaging of the abdomen and pelvis was performed following the standard protocol without IV contrast.  COMPARISON:  CT 06/10/2015  FINDINGS: Lower chest: Mild bibasilar effusions.  Hepatobiliary: Small hepatic cysts in the caudate lobe. No biliary duct dilatation. The gallbladder is normal.  Pancreas: Pancreas is normal. No ductal dilatation. No pancreatic inflammation.  Spleen: Normal spleen  Adrenals/urinary tract: Adrenal glands are normal. Simple fluid attenuation lesion upper pole of the RIGHT kidney. No renal obstruction. Foley catheter within the bladder. Small amount a gas within the bladder likely related to catheterization.  Stomach/Bowel: Stomach and duodenum are normal. The proximal small bowel mildly dilated 3.4 cm. There is poor progression of the oral contrast. The  distal small bowel is collapsed over a fairly long segment leading up to  the terminal ileum. No transition point identified. The ascending transverse and proximal descending colon are collapsed. Colostomy appears normal in LEFT lower quadrant. Hartmann's pouch is intact.  Small amount fluid in LEFT lower quadrant along of small bowel loops (image 62, series 2). There is mild enhancement of the peritoneal surface at this level. This fluid collection is not well organized. This is site of perforated diverticulitis on comparison CT  Vascular/Lymphatic: Abdominal aorta is normal caliber with atherosclerotic calcification. There is no retroperitoneal or periportal lymphadenopathy. No pelvic lymphadenopathy.  Reproductive: Prostate normal.  Musculoskeletal: No aggressive osseous lesion.  Other: There is a midline surgical drain extending vertically within the subcutaneous tissue. No evidence abscess.  IMPRESSION: 1. Poor progression of the oral contrast through the small bowel with a caliber change from mildly dilated proximal small bowel to collapsed distal small bowel and colon. Favor small bowel ileus over obstruction however consider follow-up radiographs to evaluate for potential developing obstruction. 2. Small amount free fluid in the LEFT lower quadrant does have thin enhancing rim but is not well organized. The findings suggest mild peritonitis over abscess. Recommend attention on follow-up. This was the site of perforated diverticulitis. 3. Hartmann's pouch appears normal. 4. Bilateral pleural effusions.   Electronically Signed   By: Suzy Bouchard M.D.   On: 06/16/2015 10:57   ASSESSMENT AND PLAN:  81 year old male with recurrent diverticulitis status post Hartmann procedure.  1. Rapid a.fib: now Heart rate is better controlled. Cardiology seen and following. Elquis is restarted. Pharmacy is assisting with this. continue metoprolol for rate control and stop amiodarone for now.   2. Uncontrolled  hypertension: Patient's blood pressure has much improved. Continue metoprolol, and hydralazine.  3. Hyponatremia: 130, appreciate nephrology input for fluid and electrolyte management, on normal saline at 30 cc an hour  4. Acute respiratory failure: muc better today - Likely from bilateral pleural effusion, and fluid overload.  Giving daily dose of Lasix.  tapering IV steroids - changed to BID,  Nebulizer PRN every 4 hours for now.  He is +15.4 L.   5. Perforated diverticulitis: Patient is status post Hartman's procedure. As per surgery.    6.   leukocytosis: Appreciate infectious disease input, worsening white count, patient is afebrile. will stop Zosyn.  This could be from steroids  7. Acute renal failure: Improved with IV fluids.  Nephrology consultation appreciated   8. Glaucoma: Continue atenolol and XALATAN eyedrops   Management plans discussed with the patient, Dr. byrd , family and they are in agreement.  CODE STATUS: FULL  TOTAL TIME TAKING CARE OF THIS PATIENT: 35 minutes.   Discontinue telemetry   D/C plans per primary team. Consider stopping TPN and start PO   [facility], Ellissa Ayo M.D on 06/18/2015 at 8:38 AM  Between 7am to 6pm - Pager - [phone] After 6pm go to [email or web address] - password [email or web address] [facility]  [facility]  Office  [phone]  CC:  Primary care physician; No primary care provider on file.

## 2015-06-18 NOTE — Progress Notes (Signed)
[facility]  ROUNDING NOTE   Subjective:   Wife at bedside.  Na 130 (128) Creatinine 1.74 (1.46) UOP 1775 (1900)  Objective:  Vital signs in last 24 hours:  Temp:  [97.9 F (36.6 C)-98.4 F (36.9 C)] 97.9 F (36.6 C) (09/05 0504) Pulse Rate:  [77-83] 79 (09/05 0940) Resp:  [18-20] 18 (09/05 0940) BP: (112-148)/(50-71) 144/71 mmHg (09/05 0940) SpO2:  [98 %-100 %] 98 % (09/05 0504) Weight:  [70.353 kg (155 lb 1.6 oz)] 70.353 kg (155 lb 1.6 oz) (09/05 0504)  Weight change: 0.499 kg (1 lb 1.6 oz) Filed Weights   06/16/15 0420 06/17/15 0612 06/18/15 0504  Weight: 70.897 kg (156 lb 4.8 oz) 69.854 kg (154 lb) 70.353 kg (155 lb 1.6 oz)    Intake/Output: I/O last 3 completed shifts: In: 2925.3 [I.V.:1099; IV Piggyback:150] Out: 2275 [Urine:2175; Stool:100]   Intake/Output this shift:  Total I/O In: -  Out: 300 [Urine:300]  Physical Exam: General: NAD, elderly, fraile  Head: Normocephalic, atraumatic. Dry oral mucosal membranes  Eyes: Anicteric, PERRL  Neck: Supple, trachea midline  Lungs:  Clear to auscultation  Heart: Regular rate and rhythm  Abdomen:  Soft, nontender, mild distention  Extremities: no peripheral edema.  Neurologic: Nonfocal, moving all four extremities  Skin: No lesions  GU +foley    Basic Metabolic Panel:  Recent Labs Lab 06/14/15 0506 06/15/15 0441 06/16/15 0450 06/16/15 1243 06/17/15 0625 06/18/15 0740  NA 130* 128* 128* 125* 128* 130*  K 4.1 3.4* 3.3* 4.0 3.5 3.3*  CL 98* 96* 97* 95* 95* 97*  CO2 26 25 26 24 26 26   GLUCOSE 134* 164* 157* 205* 230* 201*  BUN 43* 44* 44* 48* 55* 64*  CREATININE 1.62* 1.47* 1.32* 1.28* 1.46* 1.74*  CALCIUM 8.6* 8.5* 8.4* 8.3* 8.8* 8.6*  MG 2.2 1.8 1.6*  --  2.1 2.1  PHOS 2.4* 2.8 2.5  --  2.8 3.6    Liver Function Tests: No results for input(s): AST, ALT, ALKPHOS, BILITOT, PROT, ALBUMIN in the last 168 hours. No results for input(s): LIPASE, AMYLASE in the last 168 hours. No results  for input(s): AMMONIA in the last 168 hours.  CBC:  Recent Labs Lab 06/14/15 0506 06/15/15 0441 06/16/15 0450 06/17/15 0625 06/18/15 0740  WBC 19.9* 25.8* 29.9* 25.6* 25.5*  NEUTROABS 16.1* 23.0* 26.2* 24.0* 23.2*  HGB 10.2* 10.5* 9.5* 9.3* 9.1*  HCT 30.2* 31.3* 27.9* 27.5* 27.0*  MCV 92.8 92.9 92.0 91.8 92.4  PLT 271 300 315 384 440    Cardiac Enzymes: No results for input(s): CKTOTAL, CKMB, CKMBINDEX, TROPONINI in the last 168 hours.  BNP: Invalid input(s): POCBNP  CBG:  Recent Labs Lab 06/17/15 0603 06/17/15 1157 06/17/15 1810 06/18/15 0011 06/18/15 0502  GLUCAP 251* 232* 55* 74* 85*    Microbiology: Results for orders placed or performed during the hospital encounter of 06/02/15  MRSA PCR Screening     Status: None   Collection Time: 06/08/15 11:36 PM  Result Value Ref Range Status   MRSA by PCR NEGATIVE NEGATIVE Final    Comment:        The GeneXpert MRSA Assay (FDA approved for NASAL specimens only), is one component of a comprehensive MRSA colonization surveillance program. It is not intended to diagnose MRSA infection nor to guide or monitor treatment for MRSA infections.   Urine culture     Status: None   Collection Time: 06/15/15  3:45 PM  Result Value Ref Range Status   Specimen Description  URINE, CLEAN CATCH  Final   Special Requests zosyn Normal  Final   Culture NO GROWTH 2 DAYS  Final   Report Status 06/17/2015 FINAL  Final  Culture, blood (routine x 2)     Status: None (Preliminary result)   Collection Time: 06/15/15  3:54 PM  Result Value Ref Range Status   Specimen Description BLOOD LEFT ASSIST CONTROL  Final   Special Requests BOTTLES DRAWN AEROBIC AND ANAEROBIC  6CC  Final   Culture NO GROWTH 3 DAYS  Final   Report Status PENDING  Incomplete  Culture, blood (routine x 2)     Status: None (Preliminary result)   Collection Time: 06/15/15  4:00 PM  Result Value Ref Range Status   Specimen Description BLOOD RIGHT ASSIST CONTROL   Final   Special Requests   Final    BOTTLES DRAWN AEROBIC AND ANAEROBIC  AER 113 CC ANA 11CC   Culture NO GROWTH 3 DAYS  Final   Report Status PENDING  Incomplete    Coagulation Studies: No results for input(s): LABPROT, INR in the last 72 hours.  Urinalysis:  Recent Labs  06/15/15 1545  COLORURINE YELLOW*  LABSPEC 1.021  PHURINE 5.0  GLUCOSEU 150*  HGBUR 1+*  BILIRUBINUR NEGATIVE  KETONESUR NEGATIVE  PROTEINUR 30*  NITRITE NEGATIVE  LEUKOCYTESUR NEGATIVE      Imaging: No results found.   Medications:   . Marland KitchenTPN (CLINIMIX-E) Adult 75 mL/hr at 06/17/15 1847  . Marland KitchenTPN (CLINIMIX-E) Adult    . sodium chloride 30 mL/hr at 06/18/15 0859  . [START ON 06/19/2015] fat emulsion     . apixaban  5 mg Oral BID  . brimonidine  1 drop Both Eyes BID  . dorzolamide  1 drop Both Eyes BID  . hydrALAZINE  25 mg Oral 3 times per day  . hypromellose   Both Eyes QHS  . insulin aspart  0-9 Units Subcutaneous 4 times per day  . latanoprost  1 drop Both Eyes QHS  . methylPREDNISolone (SOLU-MEDROL) injection  60 mg Intravenous Q12H  . metoprolol tartrate  25 mg Oral BID  . potassium chloride  20 mEq Oral Once  . timolol  1 drop Both Eyes BID   [DISCONTINUED] acetaminophen **OR** acetaminophen, alum & mag hydroxide-simeth, calcium carbonate, hydrALAZINE, HYDROmorphone (DILAUDID) injection, ipratropium-albuterol, LORazepam, menthol-cetylpyridinium, metoprolol, ondansetron **OR** ondansetron (ZOFRAN) IV, sodium chloride  Assessment/ Plan:  [name] is a 81 y.o. white male with hypertension, hyperlipidemia, peripheral neurpathy and glaucoma, was admitted to [facility] on 06/02/2015 with perforated diverticulits.   1. Acute renal failure on chronic kidney disease stage III with baseline creatinine of 1.7, eGFR of 38. CKD secondary to hypertension. CT without renal lesions - Continue to monitor urine output, volume status and renal function.  - on losartan as outpatient. Holding currently -  Continue gentle IV fluids  2. Hyponatremia and hypokalemia : stable at Na 130, potassium replaced - due to TPN.   3. Hypertension: holding losartan and hydrochlorothiazide.  - hydralazine, metoprolol and furosemide.    4. Anemia of CKD/blood loss: hemoglobin 9.3 - low threshold to start epo.     LOS: [place], [place] 9/5/201611:10 AM

## 2015-06-18 NOTE — Progress Notes (Signed)
Patient is currently tolerating broth well. Patient states he feels "a little acid" in his chest when he swallows, but has not complained of any nausea so far. Will continue to assess.

## 2015-06-18 NOTE — Progress Notes (Addendum)
Report called to [facility] on 2C. Patient transporting to room 207. Belongings have been packed up and meds will be tubed to unit. Wife and patient updated and given room number. Patient has tolerated a cup of broth with no emesis or complaints. Per Dr. Manuella Ghazi patient does not need off unit telemetry. Will transport now.

## 2015-06-18 NOTE — Consult Note (Signed)
WOC ostomy consult note Stoma type/location: LLQ colostomy following Hartmann's procedure for Diverticulitis. Stomal assessment/size: Edematous, pink patent and producing soft brown stool.  Peristomal assessment: 2 " Treatment options for stomal/peristomal skin: None at this time.  Midline surgical incision noted.  Output soft brown stool.  Ostomy pouching: 2pc. 2 1/4" pouch. WIfe at bedside.   Education provided: Patient is alert today and states that he cared for his mother's colostomy many years ago.  Barriers to self care include generalized weakness, and poor vision at baseline.  Wife is at bedside and is hesitant, but willing to learn.  Wife observes measuring and cutting barrier.  Application of barrier and pouch.  Discussed emptying when 1/3 full.  Discussed twice weekly pouch changes.  Written materials given.  Wife will perform pouch change Wednesday with assistance.   Enrolled patient in [place] program: No WOC team will continue to follow and remain available to patient, medical and nursing teams.  Domenic Moras RN BSN [place] Pager [phone]

## 2015-06-18 NOTE — Care Management (Signed)
Patient is now off Amiodarone drip and heart rate is stable.  Remains on TPN.  Started on ice chips and new order present for wound/ostomy.   Ostomy nurse assessed patient this morning (9/5)

## 2015-06-18 NOTE — Care Management Important Message (Signed)
Important Message  Patient Details  Name: Alford Rentmeester MRN: [id number] Date of Birth: 1924-02-14   Medicare Important Message Given:  Yes-second notification given    Katrina Stack, RN 06/18/2015, 8:13 AM

## 2015-06-18 NOTE — Progress Notes (Signed)
Patient ID: Jesse Macias, male   DOB: [birth year], 81 y.o.   MRN: [id number]   Surgery  POD 9  S/P hartmann's procedure  Feels ok, passing some flatus per ostomy   Foley catheter remains in place.  Wife at bedside.  Filed Vitals:   06/17/15 2026 06/18/15 0504 06/18/15 0940 06/18/15 1152  BP: 148/62 132/59 144/71 121/57  Pulse: 83 78 79 68  Temp: 98.4 F (36.9 C) 97.9 F (36.6 C)  97.8 F (36.6 C)  TempSrc: Oral Oral  Oral  Resp: 20 18 18 18   Height:      Weight:  155 lb 1.6 oz (70.353 kg)    SpO2: 100% 98%  98%    PE:  Wound is clean and intact Penrose intact. Ostomy is functional. There is both gas and stool within the bag.  Labs  CBC Latest Ref Rng 06/18/2015 06/17/2015 06/16/2015  WBC 3.8 - 10.6 K/uL 25.5(H) 25.6(H) 29.9(H)  Hemoglobin 13.0 - 18.0 g/dL 9.1(L) 9.3(L) 9.5(L)  Hematocrit 40.0 - 52.0 % 27.0(L) 27.5(L) 27.9(L)  Platelets 150 - 440 K/uL 440 384 315   CMP Latest Ref Rng 06/18/2015 06/17/2015 06/16/2015  Glucose 65 - 99 mg/dL 201(H) 230(H) 205(H)  BUN 6 - 20 mg/dL 64(H) 55(H) 48(H)  Creatinine 0.61 - 1.24 mg/dL 1.74(H) 1.46(H) 1.28(H)  Sodium 135 - 145 mmol/L 130(L) 128(L) 125(L)  Potassium 3.5 - 5.1 mmol/L 3.3(L) 3.5 4.0  Chloride 101 - 111 mmol/L 97(L) 95(L) 95(L)  CO2 22 - 32 mmol/L 26 26 24   Calcium 8.9 - 10.3 mg/dL 8.6(L) 8.8(L) 8.3(L)  Total Protein 6.5 - 8.1 g/dL - - -  Total Bilirubin 0.3 - 1.2 mg/dL - - -  Alkaline Phos 38 - 126 U/L - - -  AST 15 - 41 U/L - - -  ALT 17 - 63 U/L - - -     IMP improving postop day 9  Plan:  Liquid diets discontinue Zosyn. Advance diet as tolerated once this occurs stop TPN. I believe that his white count is elevated methylprednisolone.

## 2015-06-19 LAB — BASIC METABOLIC PANEL
Anion gap: 7 (ref 5–15)
BUN: 72 mg/dL — AB (ref 6–20)
CO2: 25 mmol/L (ref 22–32)
Calcium: 8.4 mg/dL — ABNORMAL LOW (ref 8.9–10.3)
Chloride: 96 mmol/L — ABNORMAL LOW (ref 101–111)
Creatinine, Ser: 1.89 mg/dL — ABNORMAL HIGH (ref 0.61–1.24)
GFR calc Af Amer: 35 mL/min — ABNORMAL LOW (ref >60)
GFR, EST NON AFRICAN AMERICAN: 30 mL/min — AB (ref >60)
GLUCOSE: 210 mg/dL — AB (ref 65–99)
POTASSIUM: 3.5 mmol/L (ref 3.5–5.1)
Sodium: 128 mmol/L — ABNORMAL LOW (ref 135–145)

## 2015-06-19 LAB — GLUCOSE, CAPILLARY
GLUCOSE-CAPILLARY: 117 mg/dL — AB (ref 65–99)
GLUCOSE-CAPILLARY: 122 mg/dL — AB (ref 65–99)
GLUCOSE-CAPILLARY: 208 mg/dL — AB (ref 65–99)
GLUCOSE-CAPILLARY: 220 mg/dL — AB (ref 65–99)
Glucose-Capillary: 129 mg/dL — ABNORMAL HIGH (ref 65–99)

## 2015-06-19 LAB — CBC WITH DIFFERENTIAL/PLATELET
BASOS ABS: 0 10*3/uL (ref 0–0.1)
BASOS PCT: 0 %
EOS ABS: 0 10*3/uL (ref 0–0.7)
Eosinophils Relative: 0 %
HCT: 27.9 % — ABNORMAL LOW (ref 40.0–52.0)
HEMOGLOBIN: 9.6 g/dL — AB (ref 13.0–18.0)
Lymphocytes Relative: 3 %
Lymphs Abs: 0.8 10*3/uL — ABNORMAL LOW (ref 1.0–3.6)
MCH: 32 pg (ref 26.0–34.0)
MCHC: 34.3 g/dL (ref 32.0–36.0)
MCV: 93.5 fL (ref 80.0–100.0)
Monocytes Absolute: 1.1 10*3/uL — ABNORMAL HIGH (ref 0.2–1.0)
Monocytes Relative: 4 %
NEUTROS PCT: 93 %
Neutro Abs: 26.4 10*3/uL — ABNORMAL HIGH (ref 1.4–6.5)
Platelets: 442 10*3/uL — ABNORMAL HIGH (ref 150–440)
RBC: 2.99 MIL/uL — AB (ref 4.40–5.90)
RDW: 15.9 % — ABNORMAL HIGH (ref 11.5–14.5)
WBC: 28.3 10*3/uL — AB (ref 3.8–10.6)

## 2015-06-19 LAB — PHOSPHORUS: PHOSPHORUS: 4.1 mg/dL (ref 2.5–4.6)

## 2015-06-19 LAB — POTASSIUM: POTASSIUM: 3.6 mmol/L (ref 3.5–5.1)

## 2015-06-19 LAB — MAGNESIUM: MAGNESIUM: 2 mg/dL (ref 1.7–2.4)

## 2015-06-19 MED ORDER — APIXABAN 2.5 MG PO TABS
2.5000 mg | ORAL_TABLET | Freq: Two times a day (BID) | ORAL | Status: DC
  Administered 2015-06-19 – 2015-06-21 (×6): 2.5 mg via ORAL
  Filled 2015-06-19 (×7): qty 1

## 2015-06-19 MED ORDER — SODIUM CHLORIDE 1 G PO TABS
1.0000 g | ORAL_TABLET | Freq: Three times a day (TID) | ORAL | Status: DC
  Administered 2015-06-19 – 2015-06-25 (×15): 1 g via ORAL
  Filled 2015-06-19 (×20): qty 1

## 2015-06-19 MED ORDER — INSULIN ASPART 100 UNIT/ML ~~LOC~~ SOLN
0.0000 [IU] | Freq: Three times a day (TID) | SUBCUTANEOUS | Status: DC
  Administered 2015-06-19: 2 [IU] via SUBCUTANEOUS
  Administered 2015-06-19: 5 [IU] via SUBCUTANEOUS
  Administered 2015-06-20 (×2): 2 [IU] via SUBCUTANEOUS
  Administered 2015-06-21 – 2015-06-22 (×2): 3 [IU] via SUBCUTANEOUS
  Administered 2015-06-23 – 2015-06-27 (×5): 2 [IU] via SUBCUTANEOUS
  Filled 2015-06-19: qty 5
  Filled 2015-06-19 (×3): qty 2
  Filled 2015-06-19: qty 3
  Filled 2015-06-19 (×3): qty 2
  Filled 2015-06-19: qty 3
  Filled 2015-06-19: qty 2

## 2015-06-19 MED ORDER — METHYLPREDNISOLONE SODIUM SUCC 125 MG IJ SOLR
60.0000 mg | INTRAMUSCULAR | Status: DC
Start: 2015-06-20 — End: 2015-06-22
  Administered 2015-06-20 – 2015-06-22 (×3): 60 mg via INTRAVENOUS
  Filled 2015-06-19 (×3): qty 2

## 2015-06-19 MED ORDER — INSULIN ASPART 100 UNIT/ML ~~LOC~~ SOLN
0.0000 [IU] | Freq: Every day | SUBCUTANEOUS | Status: DC
  Filled 2015-06-19: qty 2

## 2015-06-19 MED ORDER — METRONIDAZOLE IN NACL 5-0.79 MG/ML-% IV SOLN
500.0000 mg | Freq: Three times a day (TID) | INTRAVENOUS | Status: DC
  Administered 2015-06-19 – 2015-06-27 (×25): 500 mg via INTRAVENOUS
  Filled 2015-06-19 (×28): qty 100

## 2015-06-19 MED ORDER — TRACE MINERALS CR-CU-MN-SE-ZN 10-1000-500-60 MCG/ML IV SOLN
INTRAVENOUS | Status: DC
  Administered 2015-06-20: 01:00:00 via INTRAVENOUS
  Filled 2015-06-19: qty 960

## 2015-06-19 MED ORDER — M.V.I. ADULT IV INJ
INJECTION | INTRAVENOUS | Status: DC
Start: 2015-06-19 — End: 2015-06-19
  Filled 2015-06-19: qty 1800

## 2015-06-19 MED ORDER — CIPROFLOXACIN IN D5W 400 MG/200ML IV SOLN
400.0000 mg | Freq: Two times a day (BID) | INTRAVENOUS | Status: DC
  Administered 2015-06-19 – 2015-06-27 (×17): 400 mg via INTRAVENOUS
  Filled 2015-06-19 (×19): qty 200

## 2015-06-19 NOTE — Progress Notes (Addendum)
Results for JAXDEN, DENOVA (MRN [id number]) as of 06/19/2015 09:09  Ref. Range 06/18/2015 00:11 06/18/2015 05:02 06/18/2015 11:50 06/18/2015 17:58  Glucose-Capillary Latest Ref Range: 65-99 mg/dL 231 (H) 192 (H) 228 (H) 193 (H)    Admit with: Diverticulitis with perforation  History: HTN  Current Insulin Orders: Novolog Sensitive SSI (0-9 units) QID     -Note patient currently receiving TPN @ 75cc/hour.  No insulin in TPN solution.  -Patient having glucose levels >200 mg/dl.    MD- Please consider changing Novolog SSI to Moderate scale (0-15 units) Q4 hours  (currently ordered as Sensitive scale Q6 hours) so patient will have more comprehensive insulin coverage (Novolog only last approximately 3-4 hours)  Please use Glycemic Control Order set     Will follow Wyn Quaker RN, MSN, CDE Diabetes Coordinator Inpatient Glycemic Control Team Team Pager: [phone] (8a-5p)

## 2015-06-19 NOTE — Clinical Social Work Note (Signed)
Patient transferred to [facility] from 2A late yesterday. The CSW for 2A sent out his referral this morning for a bedsearch.  Shela Leff MSW,LCSW [phone]

## 2015-06-19 NOTE — Consult Note (Signed)
  WOC ostomy follow up Stoma type/location: LLQ Colostomy Stomal assessment/size: 2 " less edematous today. Peristomal assessment: Barrier ring to denuded skin.  Treatment options for stomal/peristomal skin: Stool noted on ostomy barrier.  Unclear if there is a leak, but an odor is present so we will change the pouching system today.  Upon removal, noted that there was no leak, stool had soiled system with pouch emptying. Reminded to empty pouch when 1/3 full to avoid losing seal.  Wife participated in care today. Wife cut barrier opening.  Barrier ring placed from 9 to 12 o'clock, where skin is denuded.    Output Loose brown stool Ostomy pouching: 2pc. 2 1/4" pouch  Education provided: Wife able to cut barrier and apply pouch with verbal coaching.  States she is uncomfortable still at this time. Anticipate discharge to SNF for rehab.  Will continue to reinforce pouch change procedure.  Wife is reviewing written materials.  Enrolled patient in [place] Start Discharge program: Yes Will not follow at this time.  Please re-consult if needed.  Domenic Moras RN BSN [place] Pager [phone]

## 2015-06-19 NOTE — Progress Notes (Signed)
MEDICATION RELATED CONSULT NOTE - FOLLOW UP   Pharmacy Consult for Apixaban (Eliquis) Indication: Apixaban for Afib  No Known Allergies  Patient Measurements: Height: 5\' 5"  (165.1 cm) Weight: 156 lb 11.2 oz (71.079 kg) IBW/kg (Calculated) : 61.5   Vital Signs: Temp: 98 F (36.7 C) (09/05 2324) Temp Source: Oral (09/05 2324) BP: 150/73 mmHg (09/06 0812) Pulse Rate: 93 (09/06 0812)  Labs:  Recent Labs  06/16/15 1243 06/17/15 0625 06/18/15 0740 06/19/15 0438  WBC  --  25.6* 25.5* 28.3*  HGB  --  9.3* 9.1* 9.6*  HCT  --  27.5* 27.0* 27.9*  PLT  --  384 440 442*  CREATININE 1.28* 1.46* 1.74*  --   MG  --  2.1 2.1 2.0  PHOS  --  2.8 3.6 4.1   Estimated Creatinine Clearance: 25 mL/min (by C-G formula based on Cr of 1.74).   Microbiology: Recent Results (from the past 720 hour(s))  MRSA PCR Screening     Status: None   Collection Time: 06/08/15 11:36 PM  Result Value Ref Range Status   MRSA by PCR NEGATIVE NEGATIVE Final    Comment:        The GeneXpert MRSA Assay (FDA approved for NASAL specimens only), is one component of a comprehensive MRSA colonization surveillance program. It is not intended to diagnose MRSA infection nor to guide or monitor treatment for MRSA infections.   Urine culture     Status: None   Collection Time: 06/15/15  3:45 PM  Result Value Ref Range Status   Specimen Description URINE, CLEAN CATCH  Final   Special Requests zosyn Normal  Final   Culture NO GROWTH 2 DAYS  Final   Report Status 06/17/2015 FINAL  Final  Culture, blood (routine x 2)     Status: None (Preliminary result)   Collection Time: 06/15/15  3:54 PM  Result Value Ref Range Status   Specimen Description BLOOD LEFT ASSIST CONTROL  Final   Special Requests BOTTLES DRAWN AEROBIC AND ANAEROBIC  6CC  Final   Culture NO GROWTH 3 DAYS  Final   Report Status PENDING  Incomplete  Culture, blood (routine x 2)     Status: None (Preliminary result)   Collection Time: 06/15/15   4:00 PM  Result Value Ref Range Status   Specimen Description BLOOD RIGHT ASSIST CONTROL  Final   Special Requests   Final    BOTTLES DRAWN AEROBIC AND ANAEROBIC  AER 113 CC ANA 11CC   Culture NO GROWTH 3 DAYS  Final   Report Status PENDING  Incomplete    Medications:  Scheduled:  . apixaban  5 mg Oral BID  . brimonidine  1 drop Both Eyes BID  . dorzolamide  1 drop Both Eyes BID  . hydrALAZINE  25 mg Oral 3 times per day  . hypromellose   Both Eyes QHS  . insulin aspart  0-9 Units Subcutaneous 4 times per day  . latanoprost  1 drop Both Eyes QHS  . methylPREDNISolone (SOLU-MEDROL) injection  60 mg Intravenous Q12H  . metoprolol tartrate  25 mg Oral BID  . timolol  1 drop Both Eyes BID   PRN: [DISCONTINUED] acetaminophen **OR** acetaminophen, alum & mag hydroxide-simeth, calcium carbonate, hydrALAZINE, HYDROmorphone (DILAUDID) injection, ipratropium-albuterol, LORazepam, menthol-cetylpyridinium, metoprolol, ondansetron **OR** ondansetron (ZOFRAN) IV, sodium chloride  Assessment: Apixaban: Patient previously on Apixaban 2.5 mg po BID chronically for anticoagulation due to atrial fibrillation. Medication was withheld upon admission due to surgery.  Per Surgery note  today , 06/12/15, OK to begin anticoagulation  Plan:  Current orders for apixaban 5 mg po bid. Patient's SCr today is 1.74. Will adjust apixaban to 2.5 mg PO BID based on SCr >1.5 with TBW > 60kg and age > 25 yrs  Pharmacy will continue to follow.  Larene Beach, PharmD Clinical Pharmacist   06/19/2015 8:24 AM

## 2015-06-19 NOTE — Progress Notes (Addendum)
[place] [facility] [place] NAME: Jesse Macias    MR#:  [id number]  DATE OF BIRTH:  [birth year]  SUBJECTIVE:  Sodium 128, leukocytosis worsening but afebrile.  Wife at bedside.  Breathing much improved but wife concerned about his Nutrition and not getting physical therapy while here REVIEW OF SYSTEMS:    Review of Systems  Constitutional: Negative for fever, chills and malaise/fatigue.  HENT: Negative for sore throat.   Eyes: Negative for blurred vision.  Respiratory: Positive for cough and shortness of breath. Negative for hemoptysis and wheezing.   Cardiovascular: Negative for chest pain, palpitations and leg swelling.  Gastrointestinal: Negative for nausea, vomiting, abdominal pain, diarrhea and blood in stool.  Genitourinary: Negative for dysuria.  Musculoskeletal: Negative for back pain.  Neurological: Negative for dizziness, tremors and headaches.  Endo/Heme/Allergies: Does not bruise/bleed easily.    Tolerating Diet: On TPN DRUG ALLERGIES:  No Known Allergies VITALS:  Blood pressure 150/73, pulse 93, temperature 98 F (36.7 C), temperature source Oral, resp. rate 20, height 5\' 5"  (1.651 m), weight 71.079 kg (156 lb 11.2 oz), SpO2 97 %. PHYSICAL EXAMINATION:   Physical Exam  Constitutional: He is well-developed, well-nourished, and in no distress. No distress.  HENT:  Head: Normocephalic.  Eyes: No scleral icterus.  Neck: Normal range of motion. Neck supple. No JVD present. No tracheal deviation present.  Cardiovascular: Normal rate, regular rhythm and normal heart sounds.  Exam reveals no gallop and no friction rub.   No murmur heard. Pulmonary/Chest: No accessory muscle usage. No tachypnea. No respiratory distress. He has decreased breath sounds. He has no wheezes. He has no rales. He exhibits no tenderness.  Abdominal: Soft. He exhibits no distension and no mass. There is tenderness. There is no rebound and no guarding.   Penrose drain  Musculoskeletal: Normal range of motion. He exhibits no edema.  Skin: Skin is warm. No rash noted. No erythema.  Psychiatric: Affect and judgment normal.   LABORATORY PANEL:   CBC  Recent Labs Lab 06/19/15 0438  WBC 28.3*  HGB 9.6*  HCT 27.9*  PLT 442*   ------------------------------------------------------------------------------------------------------------------  Chemistries   Recent Labs Lab 06/19/15 0438  NA 128*  K 3.5  3.6  CL 96*  CO2 25  GLUCOSE 210*  BUN 72*  CREATININE 1.89*  CALCIUM 8.4*  MG 2.0    ASSESSMENT AND PLAN:  81 year old male with recurrent diverticulitis status post Hartmann procedure.  1. Rapid a.fib: now Heart rate is better controlled. Cardiology seen and following. Elquis is restarted. Pharmacy is assisting with this. continue metoprolol for rate control and stop amiodarone for now.   2. Uncontrolled hypertension: Patient's blood pressure has much improved. Continue metoprolol, and hydralazine.  3. Hyponatremia: 130 -> 128, appreciate nephrology input for fluid and electrolyte management, on normal saline at 30 cc an hour  4. Acute respiratory failure: much better today - Likely from bilateral pleural effusion, and fluid overload.  tapering IV steroids - changed to once daily,  Nebulizer PRN every 4 hours for now.  He is +16.9 L.   5. Perforated diverticulitis: Patient is status post Hartman's procedure. As per surgery.    6. leukocytosis: Appreciate infectious disease input, worsening white count, patient is afebrile. Off all abx. This could be from steroids, may need repeat CT for further eval - discussed with Dr. Ola Spurr who recommended starting Cipro and Flagyl considering his worsening leukocytosis is likely source is probably Abd if any  7. Acute  renal failure: Nephrology consultation appreciated. 1.2->1.46->1.74->1.89  8. Glaucoma: Continue atenolol and XALATAN eyedrops   Management plans discussed with  the patient, Dr. byrd , family and they are in agreement.  CODE STATUS: FULL  TOTAL TIME TAKING CARE OF THIS PATIENT: 35 minutes.   Discontinue telemetry   D/C plans per primary team. Consider stopping TPN and start PO, discussed with nurse to start physical therapy   [facility], Deriana Vanderhoef M.D on 06/19/2015 at 11:29 AM  Between 7am to 6pm - Pager - [phone] After 6pm go to [email or web address] - password [email or web address] [facility]  [place] [facility]  Office  [phone]  CC:  Primary care physician; No primary care provider on file.

## 2015-06-19 NOTE — Progress Notes (Signed)
[facility]  ROUNDING NOTE   Subjective:   Wife at bedside.  Na 128 (130) Creatinine 1.89 (1.74)  UOP 2200 (1775)  Objective:  Vital signs in last 24 hours:  Temp:  [97.8 F (36.6 C)-98 F (36.7 C)] 98 F (36.7 C) (09/05 2324) Pulse Rate:  [77-93] 91 (09/06 1138) Resp:  [20] 20 (09/06 0812) BP: (128-150)/(69-73) 150/73 mmHg (09/06 0812) SpO2:  [97 %-100 %] 98 % (09/06 1138) Weight:  [71.079 kg (156 lb 11.2 oz)] 71.079 kg (156 lb 11.2 oz) (09/06 0500)  Weight change: 0.726 kg (1 lb 9.6 oz) Filed Weights   06/17/15 0612 06/18/15 0504 06/19/15 0500  Weight: 69.854 kg (154 lb) 70.353 kg (155 lb 1.6 oz) 71.079 kg (156 lb 11.2 oz)    Intake/Output: I/O last 3 completed shifts: In: 3795.3 [I.V.:1121.5] Out: 2850 [Urine:2800; Stool:50]   Intake/Output this shift:  Total I/O In: 0  Out: 500 [Urine:500]  Physical Exam: General: NAD, elderly, fragile  Head: Normocephalic, atraumatic. Dry oral mucosal membranes  Eyes: Anicteric, PERRL  Neck: Supple, trachea midline  Lungs:  Clear to auscultation  Heart: Regular rate and rhythm  Abdomen:  Soft, nontender, mild distention  Extremities: no peripheral edema.  Neurologic: Nonfocal, moving all four extremities  Skin: No lesions  GU +foley    Basic Metabolic Panel:  Recent Labs Lab 06/15/15 0441 06/16/15 0450 06/16/15 1243 06/17/15 0625 06/18/15 0740 06/19/15 0438  NA 128* 128* 125* 128* 130* 128*  K 3.4* 3.3* 4.0 3.5 3.3* 3.5  3.6  CL 96* 97* 95* 95* 97* 96*  CO2 _0 GLUCOSE 164* 157* 205* 230* 201* 210*  BUN 44* 44* 48* 55* 64* 72*  CREATININE 1.47* 1.32* 1.28* 1.46* 1.74* 1.89*  CALCIUM 8.5* 8.4* 8.3* 8.8* 8.6* 8.4*  MG 1.8 1.6*  --  2.1 2.1 2.0  PHOS 2.8 2.5  --  2.8 3.6 4.1    Liver Function Tests: No results for input(s): AST, ALT, ALKPHOS, BILITOT, PROT, ALBUMIN in the last 168 hours. No results for input(s): LIPASE, AMYLASE in the last 168 hours. No results for input(s):  AMMONIA in the last 168 hours.  CBC:  Recent Labs Lab 06/15/15 0441 06/16/15 0450 06/17/15 0625 06/18/15 0740 06/19/15 0438  WBC 25.8* 29.9* 25.6* 25.5* 28.3*  NEUTROABS 23.0* 26.2* 24.0* 23.2* 26.4*  HGB 10.5* 9.5* 9.3* 9.1* 9.6*  HCT 31.3* 27.9* 27.5* 27.0* 27.9*  MCV 92.9 92.0 91.8 92.4 93.5  PLT 300 315 384 440 442*    Cardiac Enzymes: No results for input(s): CKTOTAL, CKMB, CKMBINDEX, TROPONINI in the last 168 hours.  BNP: Invalid input(s): POCBNP  CBG:  Recent Labs Lab 06/18/15 1150 06/18/15 1758 06/18/15 2348 06/19/15 0538 06/19/15 1132  GLUCAP 228* 193* 117* 220* 208*    Microbiology: Results for orders placed or performed during the hospital encounter of 06/02/15  MRSA PCR Screening     Status: None   Collection Time: 06/08/15 11:36 PM  Result Value Ref Range Status   MRSA by PCR NEGATIVE NEGATIVE Final    Comment:        The GeneXpert MRSA Assay (FDA approved for NASAL specimens only), is one component of a comprehensive MRSA colonization surveillance program. It is not intended to diagnose MRSA infection nor to guide or monitor treatment for MRSA infections.   Urine culture     Status: None   Collection Time: 06/15/15  3:45 PM  Result Value Ref Range Status   Specimen  Description URINE, CLEAN CATCH  Final   Special Requests zosyn Normal  Final   Culture NO GROWTH 2 DAYS  Final   Report Status 06/17/2015 FINAL  Final  Culture, blood (routine x 2)     Status: None (Preliminary result)   Collection Time: 06/15/15  3:54 PM  Result Value Ref Range Status   Specimen Description BLOOD LEFT ASSIST CONTROL  Final   Special Requests BOTTLES DRAWN AEROBIC AND ANAEROBIC  6CC  Final   Culture NO GROWTH 4 DAYS  Final   Report Status PENDING  Incomplete  Culture, blood (routine x 2)     Status: None (Preliminary result)   Collection Time: 06/15/15  4:00 PM  Result Value Ref Range Status   Specimen Description BLOOD RIGHT ASSIST CONTROL  Final    Special Requests   Final    BOTTLES DRAWN AEROBIC AND ANAEROBIC  AER 113 CC ANA 11CC   Culture NO GROWTH 4 DAYS  Final   Report Status PENDING  Incomplete    Coagulation Studies: No results for input(s): LABPROT, INR in the last 72 hours.  Urinalysis: No results for input(s): COLORURINE, LABSPEC, PHURINE, GLUCOSEU, HGBUR, BILIRUBINUR, KETONESUR, PROTEINUR, UROBILINOGEN, NITRITE, LEUKOCYTESUR in the last 72 hours.  Invalid input(s): APPERANCEUR    Imaging: No results found.   Medications:   . Marland KitchenTPN (CLINIMIX-E) Adult 75 mL/hr at 06/19/15 0014  . Marland KitchenTPN (CLINIMIX-E) Adult    . sodium chloride 30 mL/hr at 06/18/15 0859  . fat emulsion     . apixaban  2.5 mg Oral BID  . brimonidine  1 drop Both Eyes BID  . ciprofloxacin  400 mg Intravenous Q12H  . dorzolamide  1 drop Both Eyes BID  . hydrALAZINE  25 mg Oral 3 times per day  . hypromellose   Both Eyes QHS  . insulin aspart  0-15 Units Subcutaneous TID WC  . insulin aspart  0-5 Units Subcutaneous QHS  . latanoprost  1 drop Both Eyes QHS  . [START ON 06/20/2015] methylPREDNISolone (SOLU-MEDROL) injection  60 mg Intravenous Q24H  . metoprolol tartrate  25 mg Oral BID  . metronidazole  500 mg Intravenous Q8H  . sodium chloride  1 g Oral TID WC  . timolol  1 drop Both Eyes BID   [DISCONTINUED] acetaminophen **OR** acetaminophen, alum & mag hydroxide-simeth, calcium carbonate, hydrALAZINE, HYDROmorphone (DILAUDID) injection, ipratropium-albuterol, LORazepam, menthol-cetylpyridinium, metoprolol, ondansetron **OR** ondansetron (ZOFRAN) IV, sodium chloride  Assessment/ Plan:  [name] is a 81 y.o. white male with hypertension, hyperlipidemia, peripheral neurpathy and glaucoma, was admitted to [facility] on 06/02/2015 with perforated diverticulits.   1. Acute renal failure on chronic kidney disease stage III with baseline creatinine of 1.7, eGFR of 38. CKD secondary to hypertension. CT without renal lesions - Continue to monitor urine  output, volume status and renal function. Nonoliguric output.  - on losartan as outpatient. Holding currently - Continue gentle IV fluids: NS at 44m/hr  2. Hyponatremia and hypokalemia : stable at Na 128, potassium replaced - due to TPN.  - start sodium chloride.   3. Hypertension: holding losartan and hydrochlorothiazide.  - hydralazine, metoprolol and furosemide.    4. Anemia of CKD/blood loss: hemoglobin 9.6 - low threshold to start epo.     LOS: 1[place]an Mailen Newborn 9/6/20161:17 PM

## 2015-06-19 NOTE — Care Management (Signed)
Plan is for SNF placement, CSW following.

## 2015-06-19 NOTE — Progress Notes (Signed)
[place] INFECTIOUS DISEASE PROGRESS NOTE Date of Admission:  06/02/2015     ID: Jesse Macias is a 81 y.o. male with perforated diverticuli  Active Problems:   Acute diverticulitis   Perforated diverticulum of large intestine   Ileus   Subjective: Still with elevated WBC. Had CT done. [place] negative.  Reports some abd pain and distention since getting oob to chair.    ROS  Eleven systems are reviewed and negative except per hpi  Medications:  Antibiotics Given (last 72 hours)    Date/Time Action Medication Dose Rate   06/16/15 2109 Given   piperacillin-tazobactam (ZOSYN) IVPB 3.375 g 3.375 g 12.5 mL/hr   06/17/15 0330 Given   piperacillin-tazobactam (ZOSYN) IVPB 3.375 g 3.375 g 12.5 mL/hr   06/17/15 1207 Given   piperacillin-tazobactam (ZOSYN) IVPB 3.375 g 3.375 g 12.5 mL/hr   06/17/15 2140 Given   piperacillin-tazobactam (ZOSYN) IVPB 3.375 g 3.375 g 12.5 mL/hr   06/18/15 0529 Given   piperacillin-tazobactam (ZOSYN) IVPB 3.375 g 3.375 g 12.5 mL/hr   06/19/15 1412 Given   ciprofloxacin (CIPRO) IVPB 400 mg 400 mg 200 mL/hr   06/19/15 1413 Given   metroNIDAZOLE (FLAGYL) IVPB 500 mg 500 mg 100 mL/hr     . apixaban  2.5 mg Oral BID  . brimonidine  1 drop Both Eyes BID  . ciprofloxacin  400 mg Intravenous Q12H  . dorzolamide  1 drop Both Eyes BID  . hydrALAZINE  25 mg Oral 3 times per day  . hypromellose   Both Eyes QHS  . insulin aspart  0-15 Units Subcutaneous TID WC  . insulin aspart  0-5 Units Subcutaneous QHS  . latanoprost  1 drop Both Eyes QHS  . [START ON 06/20/2015] methylPREDNISolone (SOLU-MEDROL) injection  60 mg Intravenous Q24H  . metoprolol tartrate  25 mg Oral BID  . metronidazole  500 mg Intravenous Q8H  . sodium chloride  1 g Oral TID WC  . timolol  1 drop Both Eyes BID    Objective: Vital signs in last 24 hours: Temp:  [97.8 F (36.6 C)-98 F (36.7 C)] 98 F (36.7 C) (09/05 2324) Pulse Rate:  [77-93] 91 (09/06 1138) Resp:  [20] 20 (09/06  0812) BP: (128-150)/(69-73) 150/73 mmHg (09/06 0812) SpO2:  [97 %-100 %] 98 % (09/06 1138) Weight:  [71.079 kg (156 lb 11.2 oz)] 71.079 kg (156 lb 11.2 oz) (09/06 0500) Constitutional: very frail, chronically ill appearing HENT:  Mouth/Throat: Oropharynx is clear and dry . No oropharyngeal exudate.  Cardiovascular: Normal rate, regular rhythm and normal heart sounds.  Pulmonary/Chest: Effort normal bil rhonchi  Abdominal: Soft. abd is distended, he has midline incision with staples in place but relatively dry  Lymphadenopathy: He has no cervical adenopathy.  Neurological: He is alert and oriented to person, place, and time.  Skin: Skin is warm and dry. Mult ecchymosis Access - L chest CVC Foley cath  Lab Results  Recent Labs  06/18/15 0740 06/19/15 0438  WBC 25.5* 28.3*  HGB 9.1* 9.6*  HCT 27.0* 27.9*  NA 130* 128*  K 3.3* 3.5  3.6  CL 97* 96*  CO2 26 25  BUN 64* 72*  CREATININE 1.74* 1.89*    Microbiology: Results for orders placed or performed during the hospital encounter of 06/02/15  MRSA PCR Screening     Status: None   Collection Time: 06/08/15 11:36 PM  Result Value Ref Range Status   MRSA by PCR NEGATIVE NEGATIVE Final    Comment:  The GeneXpert MRSA Assay (FDA approved for NASAL specimens only), is one component of a comprehensive MRSA colonization surveillance program. It is not intended to diagnose MRSA infection nor to guide or monitor treatment for MRSA infections.   Urine culture     Status: None   Collection Time: 06/15/15  3:45 PM  Result Value Ref Range Status   Specimen Description URINE, CLEAN CATCH  Final   Special Requests zosyn Normal  Final   Culture NO GROWTH 2 DAYS  Final   Report Status 06/17/2015 FINAL  Final  Culture, blood (routine x 2)     Status: None (Preliminary result)   Collection Time: 06/15/15  3:54 PM  Result Value Ref Range Status   Specimen Description BLOOD LEFT ASSIST CONTROL  Final   Special Requests  BOTTLES DRAWN AEROBIC AND ANAEROBIC  6CC  Final   Culture NO GROWTH 4 DAYS  Final   Report Status PENDING  Incomplete  Culture, blood (routine x 2)     Status: None (Preliminary result)   Collection Time: 06/15/15  4:00 PM  Result Value Ref Range Status   Specimen Description BLOOD RIGHT ASSIST CONTROL  Final   Special Requests   Final    BOTTLES DRAWN AEROBIC AND ANAEROBIC  AER 113 CC ANA 11CC   Culture NO GROWTH 4 DAYS  Final   Report Status PENDING  Incomplete    Studies/Results:  Ct Abdomen Pelvis Wo Contrast  06/16/2015   CLINICAL DATA:  Elevated white blood cell count. History of perforated diverticulitis with Hartmann's pouch 06/09/2015. LEFT lower quadrant colostomy.  EXAM: CT ABDOMEN AND PELVIS WITHOUT CONTRAST  TECHNIQUE: Multidetector CT imaging of the abdomen and pelvis was performed following the standard protocol without IV contrast.  COMPARISON:  CT 06/10/2015  FINDINGS: Lower chest: Mild bibasilar effusions.  Hepatobiliary: Small hepatic cysts in the caudate lobe. No biliary duct dilatation. The gallbladder is normal.  Pancreas: Pancreas is normal. No ductal dilatation. No pancreatic inflammation.  Spleen: Normal spleen  Adrenals/urinary tract: Adrenal glands are normal. Simple fluid attenuation lesion upper pole of the RIGHT kidney. No renal obstruction. Foley catheter within the bladder. Small amount a gas within the bladder likely related to catheterization.  Stomach/Bowel: Stomach and duodenum are normal. The proximal small bowel mildly dilated 3.4 cm. There is poor progression of the oral contrast. The distal small bowel is collapsed over a fairly long segment leading up to the terminal ileum. No transition point identified. The ascending transverse and proximal descending colon are collapsed. Colostomy appears normal in LEFT lower quadrant. Hartmann's pouch is intact.  Small amount fluid in LEFT lower quadrant along of small bowel loops (image 62, series 2). There is mild  enhancement of the peritoneal surface at this level. This fluid collection is not well organized. This is site of perforated diverticulitis on comparison CT  Vascular/Lymphatic: Abdominal aorta is normal caliber with atherosclerotic calcification. There is no retroperitoneal or periportal lymphadenopathy. No pelvic lymphadenopathy.  Reproductive: Prostate normal.  Musculoskeletal: No aggressive osseous lesion.  Other: There is a midline surgical drain extending vertically within the subcutaneous tissue. No evidence abscess.  IMPRESSION: 1. Poor progression of the oral contrast through the small bowel with a caliber change from mildly dilated proximal small bowel to collapsed distal small bowel and colon. Favor small bowel ileus over obstruction however consider follow-up radiographs to evaluate for potential developing obstruction. 2. Small amount free fluid in the LEFT lower quadrant does have thin enhancing rim but is not well  organized. The findings suggest mild peritonitis over abscess. Recommend attention on follow-up. This was the site of perforated diverticulitis. 3. Hartmann's pouch appears normal. 4. Bilateral pleural effusions.   Electronically Signed   By: Suzy Bouchard M.D.   On: 06/16/2015 10:57   Ct Abdomen Pelvis Wo Contrast  06/02/2015   CLINICAL DATA:  Left lower quadrant pain  EXAM: CT ABDOMEN AND PELVIS WITHOUT CONTRAST  TECHNIQUE: Multidetector CT imaging of the abdomen and pelvis was performed following the standard protocol without IV contrast.  COMPARISON:  09/28/2008  FINDINGS: BODY WALL: Status post right inguinal hernia repair.  LOWER CHEST:  Gastroesophageal reflux with small hiatal hernia and prominent circumferential lower esophageal thickening. No surrounding adenopathy.  Cluster of nodules in the right middle lobe appear postinflammatory.  ABDOMEN/PELVIS:  Liver: Incidental cyst in the caudate lobe. No significant findings.  Biliary: No evidence of biliary obstruction or stone.   Pancreas: Unremarkable.  Spleen: Unremarkable.  Adrenals: Unremarkable.  Kidneys and ureters: No hydronephrosis or stone. Smooth bilateral renal atrophy. 22 mm cyst in the upper pole right kidney.  Bladder: Bladder wall thickening with cellules, chronic outlet obstruction.  Reproductive: Symmetric moderate enlargement of the prostate.  Bowel: No obstruction. No appendicitis.Distal colonic diverticulosis with focal active inflammation at the descending sigmoid junction where there is extraluminal gas. Small bubbles of pneumoperitoneum present in the left abdomen. No indication of abscess.  Retroperitoneum: No mass or adenopathy.  Vascular: No acute abnormality.  OSSEOUS: No acute abnormalities. Calcification ventral to the left hip could be soft tissue or bursal.  Critical Value/emergent results were called by telephone at the time of interpretation on 06/02/2015 at 11:01 pm to Dr. Lavonia Drafts , who verbally acknowledged these results.  IMPRESSION: 1. Perforated diverticulitis at the sigmoid descending junction with small pneumoperitoneum. No fluid collection. 2. Marked thickening of the distal esophagus with small hiatal hernia. This could reflect esophagitis or neoplasm. 3. Chronic bladder outlet obstruction.   Electronically Signed   By: Monte Fantasia M.D.   On: 06/02/2015 23:02   Dg Chest 1 View  06/12/2015   CLINICAL DATA:  Shortness of breath  EXAM: CHEST  1 VIEW  COMPARISON:  06/09/2015  FINDINGS: Left central line tip is in the SVC. NG tube enters the stomach. Mild cardiomegaly. No confluent airspace opacities or effusions. No acute bony abnormality.  IMPRESSION: Mild cardiomegaly.  No active disease.   Electronically Signed   By: Rolm Baptise M.D.   On: 06/12/2015 12:55   X-ray Chest Pa Or Ap  06/09/2015   CLINICAL DATA:  Central line placement.  EXAM: CHEST  1 VIEW  COMPARISON:  06/05/2015  FINDINGS: New left subclavian central venous line has its tip in the mid superior vena cava. No pneumothorax.   Mild medial right lung base atelectasis. No lung consolidation or edema.  Orogastric tube is stable passing below the diaphragm into the stomach.  IMPRESSION: 1. Left subclavian central venous line tip lies in the mid superior vena cava. No pneumothorax. No other change from the prior exam. No acute findings in the lungs.   Electronically Signed   By: Lajean Manes M.D.   On: 06/09/2015 11:55   Dg Abd 1 View  06/06/2015   CLINICAL DATA:  Ileus  EXAM: ABDOMEN - 1 VIEW  COMPARISON:  06/05/2015  FINDINGS: Persistent small bowel dilatation is noted although some mild decrease in the small bowel diameter is noted. Contrast material is again seen throughout colon. No free air is seen. Mild  degenerative changes of the lumbar spine are noted.  IMPRESSION: Slight improvement in the degree of small bowel dilatation   Electronically Signed   By: Inez Catalina M.D.   On: 06/06/2015 12:06   Dg Abd 1 View  06/05/2015   CLINICAL DATA:  Abdominal pain, NG tube placement.  EXAM: ABDOMEN - 1 VIEW  COMPARISON:  CT 06/02/2015  FINDINGS: Nasogastric tube is present with tip over the stomach in the left upper quadrant and side-port in the region of the gastroesophageal junction. This could be advanced another 6 cm.  Examination demonstrates air and contrast throughout the colon. There are several air-filled dilated small bowel loops in the central abdomen measuring up to 4.3 cm in diameter likely secondary ileus due to patient's known acute sigmoid diverticulitis with perforation. There mild degenerate changes of the spine and hips. Pelvic phleboliths are present.  IMPRESSION: Centralized air-filled dilated small bowel loops likely secondary ileus to patient's known acute diverticulitis with perforation.  Nasogastric tube with tip over the gastric fundus in the left upper quadrant and side-port in the region of the gastroesophageal junction. This could be advanced another 6 cm.   Electronically Signed   By: Marin Olp M.D.   On:  06/05/2015 10:03   Ct Abdomen Pelvis W Contrast  06/08/2015   CLINICAL DATA:  History of diverticulitis. AP are onset severe worsening left lower quadrant pain. Nausea, emesis. Unable to drink contrast.  EXAM: CT ABDOMEN AND PELVIS WITH CONTRAST  TECHNIQUE: Multidetector CT imaging of the abdomen and pelvis was performed using the standard protocol following bolus administration of intravenous contrast.  CONTRAST:  100 cc Omnipaque 300  COMPARISON:  06/02/2015  FINDINGS: Lower chest: Coronary artery calcifications are present. Heart size is normal. The lung bases are unremarkable.  Upper abdomen: The gallbladder is present. Within the caudate lobe there is a cyst measuring 1.6 cm. No suspicious liver lesions. No focal abnormality identified within the spleen, pancreas, or adrenal glands. The gallbladder is present.  Gastrointestinal tract: The stomach has a normal appearance. There is diffuse dilatation of small bowel loops with transition zone best localized to the left lower quadrant. Findings are consistent with ileus. Contrast reaches the sigmoid and rectum. The distal small bowel loops are normal in caliber.  Within the left lower quadrant there is a small air-fluid collection which measures 5.1 x 3.2 cm. This is adjacent to a segment of sigmoid colon contain numerous diverticula and thickened wall. Findings are consistent with perforated segment of sigmoid colon related to diverticulitis. There is likely secondary inflammation of small bowel loops accounting for the functional obstruction.  Pelvis: Urinary bladder contains Foley catheter. The bladder wall appears slightly thickened and may be secondarily inflamed. There is a small amount of free pelvic fluid. Prostate gland appears slightly prominent in size. Seminal vesicles have a normal appearance.  Retroperitoneum: There is atherosclerosis of the abdominal aorta. No aneurysm.  Abdominal wall: Unremarkable.  Osseous structures: Schmorl's nodes and mild  degenerative changes in the spine. No suspicious lytic or blastic lesions are identified.  IMPRESSION: 1. Perforation in the left lower quadrant related to diverticulitis. 2. Secondary inflammation of left lower quadrant small bowel loops creating ileus of small bowel loops. No obstruction. 3. Thickened bladder wall, likely secondarily inflamed. 4. Coronary artery disease. 5. Small liver cyst. 6. Abdominal aortic atherosclerosis. 7. Prostatic enlargement. 8. Critical Value/emergent results were called by telephone at the time of interpretation on 06/08/2015 at 3:07 pm to Dr. Phoebe Perch , who  verbally acknowledged these results.   Electronically Signed   By: Nolon Nations M.D.   On: 06/08/2015 15:07   Dg Chest Port 1 View  06/14/2015   CLINICAL DATA:  Shortness of breath.  EXAM: PORTABLE CHEST - 1 VIEW  COMPARISON:  06/12/2015 chest radiograph.  FINDINGS: Left subclavian central venous catheter terminates in the middle third of the superior vena cava. Stable cardiomediastinal silhouette with top-normal heart size. No pneumothorax. No pleural effusion. Stable mild biapical pleural-parenchymal scarring. Stable mild left basilar atelectasis. No new lung opacity. No pulmonary edema.  IMPRESSION: Stable mild left basilar atelectasis. Otherwise no active disease in the chest.   Electronically Signed   By: Ilona Sorrel M.D.   On: 06/14/2015 09:54   Dg Chest Port 1 View  06/05/2015   CLINICAL DATA:  NG tube placement  EXAM: PORTABLE CHEST - 1 VIEW  COMPARISON:  None.  FINDINGS: NG tube is seen entering the stomach with the tip in the fundus. Mild cardiomegaly. No confluent airspace opacities or effusions. No acute bony abnormality.  IMPRESSION: No active disease.   Electronically Signed   By: Rolm Baptise M.D.   On: 06/05/2015 10:04   Dg Abd 2 Views  06/15/2015   CLINICAL DATA:  Inpatient. Recent surgery. Status post Hartmann's for diverticulitis. Reflux.  EXAM: ABDOMEN - 2 VIEW  COMPARISON:  CT of the abdomen  and pelvis 06/08/2015 and 06/06/2015  FINDINGS: Dilated small bowel loops are identified throughout the central abdomen. There is paucity of large bowel gas. Surgical clips overlie the lower central pelvis. Left lower quadrant ostomy. No free intraperitoneal air.  IMPRESSION: 1. Postoperative changes. 2. No evidence for free intraperitoneal air. 3. Persistent small bowel dilatation consistent with ileus or obstruction.   Electronically Signed   By: Nolon Nations M.D.   On: 06/15/2015 14:06    Assessment/Plan: Jesse Macias is a 81 y.o. male with persistent leukocytosis in a patient now s/p Hartmans for perforated diverticuli performed 8/27. He had been on zosyn since admit. He has no fevers nor HD instability. He had cxr done 9/1 with only atelectasis, abd xray 9/2 with sb dilatation but no free air. He is on TPN through CVC, and has a foley in place Possible sources of the increased wbc include intrabd abscess, wound infection, line infection (bacteremia/candidemia since on TPN)., UTI with foley in place, C diff. All cultures negative. CT with some free fluid with mild enhancement. Has been on steroids for wheezing Soxyn stopped a few days ago  Recommendations Given CT findings and persistent elevation WBC would presume the free fluid is infected Would start cipro/flagyl for 7 days and the reevaluate. Wean steroids as appropriate Thank you very much for the consult. Will follow with you.  [place], [place]   06/19/2015, 3:35 PM

## 2015-06-19 NOTE — Progress Notes (Addendum)
Nutrition Follow-up       INTERVENTION:  PN: Dr Marina Gravel wanting to decrease rate of TPN.  Recommend TPN rate of 75ml/hr to start this pm with plans to progress po diet today.   NUTRITION DIAGNOSIS:   Inadequate oral intake related to acute illness, altered GI function as evidenced by  (NPO/CL since admission).    GOAL:   Patient will meet greater than or equal to 90% of their needs    MONITOR:    (Energy Intake, Digestive System, Electrolyte/Renal Profile, Anthropometrics)  REASON FOR ASSESSMENT:   Consult New TPN/TNA  ASSESSMENT:      Current Nutrition: Tolerating TPN of 5%AA/20% dextrose at 44ml/hr. Tolerating broth, 1/2 jello and few sips of coffee this am   Gastrointestinal Profile: ostomy with output noted, + flatus noted     Medications: NS at 52ml/hr, solumedrol, aspart  Electrolyte/Renal Profile and Glucose Profile:   Recent Labs Lab 06/16/15 1243 06/17/15 0625 06/18/15 0740 06/19/15 0438  NA 125* 128* 130*  --   K 4.0 3.5 3.3* 3.6  CL 95* 95* 97*  --   CO2 24 26 26   --   BUN 48* 55* 64*  --   CREATININE 1.28* 1.46* 1.74*  --   CALCIUM 8.3* 8.8* 8.6*  --   MG  --  2.1 2.1 2.0  PHOS  --  2.8 3.6 4.1  GLUCOSE 205* 230* 201*  --       Weight Trend since Admission: Filed Weights   06/17/15 0612 06/18/15 0504 06/19/15 0500  Weight: 154 lb (69.854 kg) 155 lb 1.6 oz (70.353 kg) 156 lb 11.2 oz (71.079 kg)      Diet Order:  .TPN (CLINIMIX-E) Adult Diet clear liquid Room service appropriate?: Yes; Fluid consistency:: Thin  Skin:  Reviewed, no issues   Height:   Ht Readings from Last 1 Encounters:  06/03/15 5\' 5"  (1.651 m)    Weight:   Wt Readings from Last 1 Encounters:  06/19/15 156 lb 11.2 oz (71.079 kg)     BMI:  Body mass index is 26.08 kg/(m^2).  Estimated Nutritional Needs:   Kcal:  1709-2019 kcals (1195, 1.3 AF, 1.1-1.3 IF)   Protein:  67-85 g (1.1-1.4 g/kg)   Fluid:  1525-1830 mL (25-30 ml/kg)   EDUCATION NEEDS:    No education needs identified at this time  [place]. Zenia Resides, [place], [place] (pager)

## 2015-06-19 NOTE — Progress Notes (Signed)
Physical Therapy Treatment Patient Details Name: Renso Micheau MRN: [id number] DOB: 02-06-1924 Today's Date: 06/19/2015    History of Present Illness presented to ER with acute abdominal/LLQ pain; admitted with diverticulitis with microperforation.  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 Post-op course complicated by transfer to CCU due to afib with RVR (requiring amioderone drip).    PT Comments    Pt complains of extreme weakness and fatigue. Pt's spouse wished pt to have PT everyday. Discussed with both pt and spouse the need for pt to spend time up in chair and performance of exercises throughout the day for increasing strength; they understand. Spouse eager for patient to ambulate and have increased time/days of PT. Discussion with spouse on patient's level of tolerance as well as dizziness and balance issues that limit patient at this time. Also discussed process of building strength and tolerance and the fact that this will take a longer time than it did to decline. Pt and spouse understand and again reiterated the need for pt and spouse to work together other times throughout the day on being up out of bed and exercises. Rolling walker placed in room to be used by PT and nursing to encouraged stand/ambulation. Spoke with care management regarding session.   Follow Up Recommendations  SNF     Equipment Recommendations  Rolling walker with 5" wheels    Recommendations for Other Services       Precautions / Restrictions Restrictions Weight Bearing Restrictions: No    Mobility  Bed Mobility Overal bed mobility: Modified Independent Bed Mobility: Supine to Sit     Supine to sit: Modified independent (Device/Increase time) (use of rails and increased time/effort)        Transfers Overall transfer level: Needs assistance Equipment used: Rolling walker (2 wheeled) Transfers: Sit to/from  Stand Sit to Stand: Mod assist         General transfer comment:  (reports mild dizziness; feels very weak/unsteady )  Ambulation/Gait Ambulation/Gait assistance: Mod assist;Min assist Ambulation Distance (Feet): 4 Feet (bed to chair) Assistive device: Rolling walker (2 wheeled) Gait Pattern/deviations: Step-to pattern;Decreased step length - right;Decreased step length - left (effortful) Gait velocity: slow Gait velocity interpretation: <1.8 ft/sec, indicative of risk for recurrent falls     Stairs            Wheelchair Mobility    Modified Rankin (Stroke Patients Only)       Balance           Standing balance support: Bilateral upper extremity supported Standing balance-Leahy Scale: Poor                      Cognition Arousal/Alertness: Awake/alert (very fatigued) Behavior During Therapy: WFL for tasks assessed/performed Overall Cognitive Status: Within Functional Limits for tasks assessed                      Exercises General Exercises - Lower Extremity Ankle Circles/Pumps: AROM;Both;20 reps;Seated Quad Sets: Strengthening;Both;20 reps;Seated Gluteal Sets: Strengthening;Both;20 reps;Seated Long Arc Quad: AAROM;Both;20 reps;Seated Heel Slides: AROM;Both;20 reps;Seated Hip ABduction/ADduction: AROM;Both;AAROM;20 reps;Seated Other Exercises Other Exercises: stand tolerance 2x up to 1 minute    General Comments        Pertinent Vitals/Pain Pain Assessment: No/denies pain    Home Living                      Prior Function  PT Goals (current goals can now be found in the care plan section) Progress towards PT goals: Progressing toward goals    Frequency  Min 2X/week (spouse requesting every day PT)    PT Plan Current plan remains appropriate    Co-evaluation             End of Session Equipment Utilized During Treatment: Gait belt Activity Tolerance: Patient limited by fatigue (offered  additional attempts to ambulate; pt too fatigued) Patient left: in chair;with call bell/phone within reach;with nursing/sitter in room;with family/visitor present     Time: [id number] PT Time Calculation (min) (ACUTE ONLY): 33 min  Charges:  $Gait Training: 8-22 mins $Therapeutic Exercise: 8-22 mins                    G Codes:      [name] 06/19/2015, 12:31 PM

## 2015-06-19 NOTE — Progress Notes (Signed)
Patient ID: Jesse Macias, male   DOB: 04-08-1924, 81 y.o.   MRN: [id number]   Surgery  POD 10  S/P hartmann's   She continues to improve daily. He feels washed out and weak today. His wife at the bedside. She thinks he is in dire need of physical therapy. There is been no further nausea and vomiting.  Filed Vitals:   06/18/15 2324 06/19/15 0500 06/19/15 0627 06/19/15 0812  BP: 140/69  140/70 150/73  Pulse: 80  81 93  Temp: 98 F (36.7 C)     TempSrc: Oral     Resp:    20  Height:      Weight:  156 lb 11.2 oz (71.079 kg)    SpO2: 100%   97%    PE:  She is alert and oriented. His abdomen is soft nondistended Penrose drain was removed ostomy appears to be functioning.  Labs  CBC Latest Ref Rng 06/19/2015 06/18/2015 06/17/2015  WBC 3.8 - 10.6 K/uL 28.3(H) 25.5(H) 25.6(H)  Hemoglobin 13.0 - 18.0 g/dL 9.6(L) 9.1(L) 9.3(L)  Hematocrit 40.0 - 52.0 % 27.9(L) 27.0(L) 27.5(L)  Platelets 150 - 440 K/uL 442(H) 440 384   CMP Latest Ref Rng 06/19/2015 06/18/2015 06/17/2015  Glucose 65 - 99 mg/dL - 201(H) 230(H)  BUN 6 - 20 mg/dL - 64(H) 55(H)  Creatinine 0.61 - 1.24 mg/dL - 1.74(H) 1.46(H)  Sodium 135 - 145 mmol/L - 130(L) 128(L)  Potassium 3.5 - 5.1 mmol/L 3.6 3.3(L) 3.5  Chloride 101 - 111 mmol/L - 97(L) 95(L)  CO2 22 - 32 mmol/L - 26 26  Calcium 8.9 - 10.3 mg/dL - 8.6(L) 8.8(L)  Total Protein 6.5 - 8.1 g/dL - - -  Total Bilirubin 0.3 - 1.2 mg/dL - - -  Alkaline Phos 38 - 126 U/L - - -  AST 15 - 41 U/L - - -  ALT 17 - 63 U/L - - -   I/O last 3 completed shifts: In: 3795.3 [I.V.:1121.5] Out: 2850 [Urine:2800; Stool:50]      IMP  slowly making progress postoperative day #10.  Plan:  We will discontinue his Foley catheter. I'll recheck his BMP today as his creatinine was somewhat elevated over his baseline from yesterday. Furthermore I have consult it again with social work and case management regarding placement for rehabilitation upon discharge.

## 2015-06-19 NOTE — Progress Notes (Addendum)
PARENTERAL NUTRITION CONSULT NOTE - Follow up  Pharmacy Consult for Electrolyte/Glucose Management Indication: TPN  No Known Allergies  Patient Measurements: Height: 5\' 5"  (165.1 cm) Weight: 156 lb 11.2 oz (71.079 kg) IBW/kg (Calculated) : 61.5  Vital Signs: Temp: 98 F (36.7 C) (09/05 2324) Temp Source: Oral (09/05 2324) BP: 150/73 mmHg (09/06 0812) Pulse Rate: 93 (09/06 0812) Intake/Output from previous day: 09/05 0701 - 09/06 0700 In: 3795.3 [I.V.:1121.5; [id number] Out: 2250 [Urine:2200; Stool:50] Intake/Output from this shift:    Labs:  Recent Labs  06/17/15 0625 06/18/15 0740 06/19/15 0438  WBC 25.6* 25.5* 28.3*  HGB 9.3* 9.1* 9.6*  HCT 27.5* 27.0* 27.9*  PLT 384 440 442*     Recent Labs  06/16/15 1243 06/17/15 0625 06/18/15 0740 06/19/15 0438  NA 125* 128* 130*  --   K 4.0 3.5 3.3* 3.6  CL 95* 95* 97*  --   CO2 24 26 26   --   GLUCOSE 205* 230* 201*  --   BUN 48* 55* 64*  --   CREATININE 1.28* 1.46* 1.74*  --   CALCIUM 8.3* 8.8* 8.6*  --   MG  --  2.1 2.1 2.0  PHOS  --  2.8 3.6 4.1   Estimated Creatinine Clearance: 25 mL/min (by C-G formula based on Cr of 1.74).    Recent Labs  06/18/15 1758 06/18/15 2348 06/19/15 0538  GLUCAP 193* 117* 220*    Medical History: Past Medical History  Diagnosis Date  . Hypertension   . History of hiatal hernia     Medications:  Infusions:  . Marland KitchenTPN (CLINIMIX-E) Adult 75 mL/hr at 06/19/15 0014  . sodium chloride 30 mL/hr at 06/18/15 0859  . fat emulsion      Assessment: Pharmacy consulted to manage electrolytes and glucose in this 81 year old male on TPN.   Current orders for Clinimix E 5/20 at 58ml/hr  Plan:  Electrolytes WNL, potassium lower limit of normal, has been requiring daily supplementation. Will give 28mEq oral and recheck electrolytes with AM labs.  18 units of SSI over 24 hours. Patient is ordered Solu-Medrol 60mg  IV Q12H. Will continue SSI and Q6H accuchecks. May need to add  basal insulin if steroids continued.   Larene Beach, PharmD  Clinical Pharmacist   06/19/2015, 8:21 AM

## 2015-06-20 LAB — BASIC METABOLIC PANEL
ANION GAP: 6 (ref 5–15)
BUN: 69 mg/dL — ABNORMAL HIGH (ref 6–20)
CALCIUM: 8.4 mg/dL — AB (ref 8.9–10.3)
CO2: 26 mmol/L (ref 22–32)
Chloride: 102 mmol/L (ref 101–111)
Creatinine, Ser: 1.67 mg/dL — ABNORMAL HIGH (ref 0.61–1.24)
GFR, EST AFRICAN AMERICAN: 40 mL/min — AB (ref >60)
GFR, EST NON AFRICAN AMERICAN: 35 mL/min — AB (ref >60)
GLUCOSE: 113 mg/dL — AB (ref 65–99)
POTASSIUM: 3.5 mmol/L (ref 3.5–5.1)
Sodium: 134 mmol/L — ABNORMAL LOW (ref 135–145)

## 2015-06-20 LAB — CBC WITH DIFFERENTIAL/PLATELET
Basophils Absolute: 0 10*3/uL (ref 0–0.1)
Basophils Relative: 0 %
Eosinophils Absolute: 0.1 10*3/uL (ref 0–0.7)
Eosinophils Relative: 0 %
HEMATOCRIT: 28.2 % — AB (ref 40.0–52.0)
HEMOGLOBIN: 9.6 g/dL — AB (ref 13.0–18.0)
LYMPHS ABS: 1.4 10*3/uL (ref 1.0–3.6)
MCH: 31.5 pg (ref 26.0–34.0)
MCHC: 34 g/dL (ref 32.0–36.0)
MCV: 92.7 fL (ref 80.0–100.0)
Monocytes Absolute: 1.9 10*3/uL — ABNORMAL HIGH (ref 0.2–1.0)
NEUTROS ABS: 23 10*3/uL — AB (ref 1.4–6.5)
Platelets: 417 10*3/uL (ref 150–440)
RBC: 3.04 MIL/uL — AB (ref 4.40–5.90)
RDW: 15.8 % — ABNORMAL HIGH (ref 11.5–14.5)
WBC: 26.5 10*3/uL — AB (ref 3.8–10.6)

## 2015-06-20 LAB — GLUCOSE, CAPILLARY
GLUCOSE-CAPILLARY: 138 mg/dL — AB (ref 65–99)
GLUCOSE-CAPILLARY: 143 mg/dL — AB (ref 65–99)
GLUCOSE-CAPILLARY: 113 mg/dL — AB (ref 65–99)
Glucose-Capillary: 116 mg/dL — ABNORMAL HIGH (ref 65–99)

## 2015-06-20 LAB — CULTURE, BLOOD (ROUTINE X 2)
CULTURE: NO GROWTH
CULTURE: NO GROWTH

## 2015-06-20 LAB — PHOSPHORUS: Phosphorus: 3.1 mg/dL (ref 2.5–4.6)

## 2015-06-20 LAB — MAGNESIUM: MAGNESIUM: 2 mg/dL (ref 1.7–2.4)

## 2015-06-20 MED ORDER — ENSURE ENLIVE PO LIQD
237.0000 mL | Freq: Two times a day (BID) | ORAL | Status: DC
  Administered 2015-06-20 – 2015-06-23 (×7): 237 mL via ORAL

## 2015-06-20 NOTE — Progress Notes (Signed)
Pt. bil upper ext. has +3 edema and lower ext +2 edema, abd distended and generalized edema. Pt. also having pain and discomfort voiding, foley placed. MD aware. Pt. did sit at side of bed this shift to try and void.

## 2015-06-20 NOTE — Progress Notes (Signed)
D/c NGT order per Dr Marina Gravel

## 2015-06-20 NOTE — Progress Notes (Signed)
Nutrition Follow-up    INTERVENTION:  PN: Dr Marina Gravel stopping TPN today Nutrition Supplement Therapy: Recommend adding Ensure Enlive po BID, each supplement provides 350 kcal and 20 grams of protein    NUTRITION DIAGNOSIS:   Inadequate oral intake related to acute illness, altered GI function as evidenced by  (NPO/CL since admission).    GOAL:   Patient will meet greater than or equal to 90% of their needs    MONITOR:    (Energy Intake, Digestive System, Electrolyte/Renal Profile, Anthropometrics)  REASON FOR ASSESSMENT:   Consult New TPN/TNA  ASSESSMENT:     Noted ID following. Per MD note CT scan with nominal fluid collection within pelvis.     Current Nutrition: tolerating liquids, diet progressed to soft this pm    Gastrointestinal Profile: no nausea, or vomiting, ostomy with output   Medications: NS at 71ml/hr, Na tabs ordered  Electrolyte/Renal Profile and Glucose Profile:   Recent Labs Lab 06/18/15 0740 06/19/15 0438 06/20/15 0429  NA 130* 128* 134*  K 3.3* 3.5  3.6 3.5  CL 97* 96* 102  CO2 26 25 26   BUN 64* 72* 69*  CREATININE 1.74* 1.89* 1.67*  CALCIUM 8.6* 8.4* 8.4*  MG 2.1 2.0 2.0  PHOS 3.6 4.1 3.1  GLUCOSE 201* 210* 113*        Weight Trend since Admission: Filed Weights   06/18/15 0504 06/19/15 0500 06/20/15 0358  Weight: 155 lb 1.6 oz (70.353 kg) 156 lb 11.2 oz (71.079 kg) 162 lb 6.4 oz (73.664 kg)      Diet Order:  DIET SOFT Room service appropriate?: Yes; Fluid consistency:: Thin  Skin:  Reviewed, no issues  Last BM:  8/24  Height:   Ht Readings from Last 1 Encounters:  06/03/15 5\' 5"  (1.651 m)    Weight:   Wt Readings from Last 1 Encounters:  06/20/15 162 lb 6.4 oz (73.664 kg)     BMI:  Body mass index is 27.02 kg/(m^2).  Estimated Nutritional Needs:   Kcal:  1709-2019 kcals (1195, 1.3 AF, 1.1-1.3 IF)   Protein:  67-85 g (1.1-1.4 g/kg)   Fluid:  1525-1830 mL (25-30 ml/kg)   EDUCATION NEEDS:   No  education needs identified at this time  MODERATE Care Level.  Clark Clowdus B. Zenia Resides, [place], [place] (pager)

## 2015-06-20 NOTE — Clinical Social Work Note (Signed)
Patient has had bed offers from local facilities. These offers have been extended to patient's wife. She is going to review the list and let me know her decision after speaking with her children. Shela Leff MSW,LCSW [phone]

## 2015-06-20 NOTE — Progress Notes (Signed)
MEDICATION RELATED CONSULT NOTE - FOLLOW UP   Pharmacy Consult for Apixaban  Indication: Afib  No Known Allergies  Patient Measurements: Height: 5\' 5"  (165.1 cm) Weight: 162 lb 6.4 oz (73.664 kg) IBW/kg (Calculated) : 61.5   Vital Signs: Temp: 98 F (36.7 C) (09/07 0810) Temp Source: Oral (09/07 0810) BP: 119/65 mmHg (09/07 0810) Pulse Rate: 81 (09/07 0810) Intake/Output from previous day: 09/06 0701 - 09/07 0700 In: 2828.3 [P.O.:240; I.V.:704; IV Piggyback:500; TPN:1384.3] Out: 1230 [Urine:1230] Intake/Output from this shift: Total I/O In: 111 [I.V.:40; IV Piggyback:71] Out: 275 [Emesis/NG output:275]  Labs:  Recent Labs  06/18/15 0740 06/19/15 0438 06/20/15 0429  WBC 25.5* 28.3* 26.5*  HGB 9.1* 9.6* 9.6*  HCT 27.0* 27.9* 28.2*  PLT 440 442* 417  CREATININE 1.74* 1.89* 1.67*  MG 2.1 2.0 2.0  PHOS 3.6 4.1 3.1   Estimated Creatinine Clearance: 26.1 mL/min (by C-G formula based on Cr of 1.67).   Microbiology: Recent Results (from the past 720 hour(s))  MRSA PCR Screening     Status: None   Collection Time: 06/08/15 11:36 PM  Result Value Ref Range Status   MRSA by PCR NEGATIVE NEGATIVE Final    Comment:        The GeneXpert MRSA Assay (FDA approved for NASAL specimens only), is one component of a comprehensive MRSA colonization surveillance program. It is not intended to diagnose MRSA infection nor to guide or monitor treatment for MRSA infections.   Urine culture     Status: None   Collection Time: 06/15/15  3:45 PM  Result Value Ref Range Status   Specimen Description URINE, CLEAN CATCH  Final   Special Requests zosyn Normal  Final   Culture NO GROWTH 2 DAYS  Final   Report Status 06/17/2015 FINAL  Final  Culture, blood (routine x 2)     Status: None (Preliminary result)   Collection Time: 06/15/15  3:54 PM  Result Value Ref Range Status   Specimen Description BLOOD LEFT ASSIST CONTROL  Final   Special Requests BOTTLES DRAWN AEROBIC AND  ANAEROBIC  6CC  Final   Culture NO GROWTH 4 DAYS  Final   Report Status PENDING  Incomplete  Culture, blood (routine x 2)     Status: None (Preliminary result)   Collection Time: 06/15/15  4:00 PM  Result Value Ref Range Status   Specimen Description BLOOD RIGHT ASSIST CONTROL  Final   Special Requests   Final    BOTTLES DRAWN AEROBIC AND ANAEROBIC  AER 113 CC ANA 11CC   Culture NO GROWTH 4 DAYS  Final   Report Status PENDING  Incomplete     Assessment: Patient is a 81 yo male who is post surgery for recurrent diverticulitis. Patient was previously on apixaban prior to  admission for A.fib. Apixaban was held for surgery. Patients SCr today 1.67 (down from 1.89)   Plan:  Will continue patients current apixaban dose of 2.5mg  BID due to SCr >1.5 and age >42yrs. SCr ordered for tomorrow, pharmacy will continue to follow.   Nancy Fetter, PharmD Pharmacy Resident

## 2015-06-20 NOTE — Progress Notes (Signed)
[facility] PROGRESS NOTE Date of Admission:  06/02/2015     ID: Jesse Macias is a 81 y.o. male with perforated diverticuli  Active Problems:   Acute diverticulitis   Perforated diverticulum of large intestine   Ileus   Subjective: Says less abd pain, feels a little better. No fevers. Wbc down some  ROS  Eleven systems are reviewed and negative except per hpi  Medications:  Antibiotics Given (last 72 hours)    Date/Time Action Medication Dose Rate   06/17/15 2140 Given   piperacillin-tazobactam (ZOSYN) IVPB 3.375 g 3.375 g 12.5 mL/hr   06/18/15 0529 Given   piperacillin-tazobactam (ZOSYN) IVPB 3.375 g 3.375 g 12.5 mL/hr   06/19/15 1412 Given   ciprofloxacin (CIPRO) IVPB 400 mg 400 mg 200 mL/hr   06/19/15 1413 Given   metroNIDAZOLE (FLAGYL) IVPB 500 mg 500 mg 100 mL/hr   06/19/15 2044 Given   metroNIDAZOLE (FLAGYL) IVPB 500 mg 500 mg 100 mL/hr   06/20/15 0010 Given   ciprofloxacin (CIPRO) IVPB 400 mg 400 mg 200 mL/hr   06/20/15 0439 Given   metroNIDAZOLE (FLAGYL) IVPB 500 mg 500 mg 100 mL/hr   06/20/15 1246 Given   ciprofloxacin (CIPRO) IVPB 400 mg 400 mg 200 mL/hr   06/20/15 1249 Given   metroNIDAZOLE (FLAGYL) IVPB 500 mg 500 mg 100 mL/hr     . apixaban  2.5 mg Oral BID  . brimonidine  1 drop Both Eyes BID  . ciprofloxacin  400 mg Intravenous Q12H  . dorzolamide  1 drop Both Eyes BID  . feeding supplement (ENSURE ENLIVE)  237 mL Oral BID AC  . hydrALAZINE  25 mg Oral 3 times per day  . hypromellose   Both Eyes QHS  . insulin aspart  0-15 Units Subcutaneous TID WC  . insulin aspart  0-5 Units Subcutaneous QHS  . latanoprost  1 drop Both Eyes QHS  . methylPREDNISolone (SOLU-MEDROL) injection  60 mg Intravenous Q24H  . metoprolol tartrate  25 mg Oral BID  . metronidazole  500 mg Intravenous Q8H  . sodium chloride  1 g Oral TID WC  . timolol  1 drop Both Eyes BID    Objective: Vital signs in last 24 hours: Temp:  [97.8 F (36.6 C)-98 F  (36.7 C)] 98 F (36.7 C) (09/07 0810) Pulse Rate:  [79-86] 81 (09/07 0810) Resp:  [4-18] 16 (09/07 0810) BP: (118-141)/(54-65) 119/65 mmHg (09/07 0810) SpO2:  [99 %-100 %] 100 % (09/07 0810) Weight:  [73.664 kg (162 lb 6.4 oz)] 73.664 kg (162 lb 6.4 oz) (09/07 0358) Constitutional: very frail, chronically ill appearing HENT:  Mouth/Throat: Oropharynx is clear and dry . No oropharyngeal exudate.  Cardiovascular: Normal rate, regular rhythm and normal heart sounds.  Pulmonary/Chest: Effort normal bil rhonchi  Abdominal: Soft. abd is distended, he has midline incision with staples in place but relatively dry  Lymphadenopathy: He has no cervical adenopathy.  Neurological: He is alert and oriented to person, place, and time.  Skin: Skin is warm and dry. Mult ecchymosis Access - L chest CVC Foley cath  Lab Results  Recent Labs  06/19/15 0438 06/20/15 0429  WBC 28.3* 26.5*  HGB 9.6* 9.6*  HCT 27.9* 28.2*  NA 128* 134*  K 3.5  3.6 3.5  CL 96* 102  CO2 25 26  BUN 72* 69*  CREATININE 1.89* 1.67*    Microbiology: Results for orders placed or performed during the hospital encounter of 06/02/15  MRSA PCR Screening  Status: None   Collection Time: 06/08/15 11:36 PM  Result Value Ref Range Status   MRSA by PCR NEGATIVE NEGATIVE Final    Comment:        The GeneXpert MRSA Assay (FDA approved for NASAL specimens only), is one component of a comprehensive MRSA colonization surveillance program. It is not intended to diagnose MRSA infection nor to guide or monitor treatment for MRSA infections.   Urine culture     Status: None   Collection Time: 06/15/15  3:45 PM  Result Value Ref Range Status   Specimen Description URINE, CLEAN CATCH  Final   Special Requests zosyn Normal  Final   Culture NO GROWTH 2 DAYS  Final   Report Status 06/17/2015 FINAL  Final  Culture, blood (routine x 2)     Status: None   Collection Time: 06/15/15  3:54 PM  Result Value Ref Range  Status   Specimen Description BLOOD LEFT ASSIST CONTROL  Final   Special Requests BOTTLES DRAWN AEROBIC AND ANAEROBIC  6CC  Final   Culture NO GROWTH 5 DAYS  Final   Report Status 06/20/2015 FINAL  Final  Culture, blood (routine x 2)     Status: None   Collection Time: 06/15/15  4:00 PM  Result Value Ref Range Status   Specimen Description BLOOD RIGHT ASSIST CONTROL  Final   Special Requests   Final    BOTTLES DRAWN AEROBIC AND ANAEROBIC  AER 113 CC ANA 11CC   Culture NO GROWTH 5 DAYS  Final   Report Status 06/20/2015 FINAL  Final    Studies/Results:  Ct Abdomen Pelvis Wo Contrast  06/16/2015   CLINICAL DATA:  Elevated white blood cell count. History of perforated diverticulitis with Hartmann's pouch 06/09/2015. LEFT lower quadrant colostomy.  EXAM: CT ABDOMEN AND PELVIS WITHOUT CONTRAST  TECHNIQUE: Multidetector CT imaging of the abdomen and pelvis was performed following the standard protocol without IV contrast.  COMPARISON:  CT 06/10/2015  FINDINGS: Lower chest: Mild bibasilar effusions.  Hepatobiliary: Small hepatic cysts in the caudate lobe. No biliary duct dilatation. The gallbladder is normal.  Pancreas: Pancreas is normal. No ductal dilatation. No pancreatic inflammation.  Spleen: Normal spleen  Adrenals/urinary tract: Adrenal glands are normal. Simple fluid attenuation lesion upper pole of the RIGHT kidney. No renal obstruction. Foley catheter within the bladder. Small amount a gas within the bladder likely related to catheterization.  Stomach/Bowel: Stomach and duodenum are normal. The proximal small bowel mildly dilated 3.4 cm. There is poor progression of the oral contrast. The distal small bowel is collapsed over a fairly long segment leading up to the terminal ileum. No transition point identified. The ascending transverse and proximal descending colon are collapsed. Colostomy appears normal in LEFT lower quadrant. Hartmann's pouch is intact.  Small amount fluid in LEFT lower quadrant  along of small bowel loops (image 62, series 2). There is mild enhancement of the peritoneal surface at this level. This fluid collection is not well organized. This is site of perforated diverticulitis on comparison CT  Vascular/Lymphatic: Abdominal aorta is normal caliber with atherosclerotic calcification. There is no retroperitoneal or periportal lymphadenopathy. No pelvic lymphadenopathy.  Reproductive: Prostate normal.  Musculoskeletal: No aggressive osseous lesion.  Other: There is a midline surgical drain extending vertically within the subcutaneous tissue. No evidence abscess.  IMPRESSION: 1. Poor progression of the oral contrast through the small bowel with a caliber change from mildly dilated proximal small bowel to collapsed distal small bowel and colon. Favor small bowel  ileus over obstruction however consider follow-up radiographs to evaluate for potential developing obstruction. 2. Small amount free fluid in the LEFT lower quadrant does have thin enhancing rim but is not well organized. The findings suggest mild peritonitis over abscess. Recommend attention on follow-up. This was the site of perforated diverticulitis. 3. Hartmann's pouch appears normal. 4. Bilateral pleural effusions.   Electronically Signed   By: Suzy Bouchard M.D.   On: 06/16/2015 10:57   Ct Abdomen Pelvis Wo Contrast  06/02/2015   CLINICAL DATA:  Left lower quadrant pain  EXAM: CT ABDOMEN AND PELVIS WITHOUT CONTRAST  TECHNIQUE: Multidetector CT imaging of the abdomen and pelvis was performed following the standard protocol without IV contrast.  COMPARISON:  09/28/2008  FINDINGS: BODY WALL: Status post right inguinal hernia repair.  LOWER CHEST:  Gastroesophageal reflux with small hiatal hernia and prominent circumferential lower esophageal thickening. No surrounding adenopathy.  Cluster of nodules in the right middle lobe appear postinflammatory.  ABDOMEN/PELVIS:  Liver: Incidental cyst in the caudate lobe. No significant  findings.  Biliary: No evidence of biliary obstruction or stone.  Pancreas: Unremarkable.  Spleen: Unremarkable.  Adrenals: Unremarkable.  Kidneys and ureters: No hydronephrosis or stone. Smooth bilateral renal atrophy. 22 mm cyst in the upper pole right kidney.  Bladder: Bladder wall thickening with cellules, chronic outlet obstruction.  Reproductive: Symmetric moderate enlargement of the prostate.  Bowel: No obstruction. No appendicitis.Distal colonic diverticulosis with focal active inflammation at the descending sigmoid junction where there is extraluminal gas. Small bubbles of pneumoperitoneum present in the left abdomen. No indication of abscess.  Retroperitoneum: No mass or adenopathy.  Vascular: No acute abnormality.  OSSEOUS: No acute abnormalities. Calcification ventral to the left hip could be soft tissue or bursal.  Critical Value/emergent results were called by telephone at the time of interpretation on 06/02/2015 at 11:01 pm to Dr. Lavonia Drafts , who verbally acknowledged these results.  IMPRESSION: 1. Perforated diverticulitis at the sigmoid descending junction with small pneumoperitoneum. No fluid collection. 2. Marked thickening of the distal esophagus with small hiatal hernia. This could reflect esophagitis or neoplasm. 3. Chronic bladder outlet obstruction.   Electronically Signed   By: Monte Fantasia M.D.   On: 06/02/2015 23:02   Dg Chest 1 View  06/12/2015   CLINICAL DATA:  Shortness of breath  EXAM: CHEST  1 VIEW  COMPARISON:  06/09/2015  FINDINGS: Left central line tip is in the SVC. NG tube enters the stomach. Mild cardiomegaly. No confluent airspace opacities or effusions. No acute bony abnormality.  IMPRESSION: Mild cardiomegaly.  No active disease.   Electronically Signed   By: Rolm Baptise M.D.   On: 06/12/2015 12:55   X-ray Chest Pa Or Ap  06/09/2015   CLINICAL DATA:  Central line placement.  EXAM: CHEST  1 VIEW  COMPARISON:  06/05/2015  FINDINGS: New left subclavian central  venous line has its tip in the mid superior vena cava. No pneumothorax.  Mild medial right lung base atelectasis. No lung consolidation or edema.  Orogastric tube is stable passing below the diaphragm into the stomach.  IMPRESSION: 1. Left subclavian central venous line tip lies in the mid superior vena cava. No pneumothorax. No other change from the prior exam. No acute findings in the lungs.   Electronically Signed   By: Lajean Manes M.D.   On: 06/09/2015 11:55   Dg Abd 1 View  06/06/2015   CLINICAL DATA:  Ileus  EXAM: ABDOMEN - 1 VIEW  COMPARISON:  06/05/2015  FINDINGS: Persistent small bowel dilatation is noted although some mild decrease in the small bowel diameter is noted. Contrast material is again seen throughout colon. No free air is seen. Mild degenerative changes of the lumbar spine are noted.  IMPRESSION: Slight improvement in the degree of small bowel dilatation   Electronically Signed   By: Inez Catalina M.D.   On: 06/06/2015 12:06   Dg Abd 1 View  06/05/2015   CLINICAL DATA:  Abdominal pain, NG tube placement.  EXAM: ABDOMEN - 1 VIEW  COMPARISON:  CT 06/02/2015  FINDINGS: Nasogastric tube is present with tip over the stomach in the left upper quadrant and side-port in the region of the gastroesophageal junction. This could be advanced another 6 cm.  Examination demonstrates air and contrast throughout the colon. There are several air-filled dilated small bowel loops in the central abdomen measuring up to 4.3 cm in diameter likely secondary ileus due to patient's known acute sigmoid diverticulitis with perforation. There mild degenerate changes of the spine and hips. Pelvic phleboliths are present.  IMPRESSION: Centralized air-filled dilated small bowel loops likely secondary ileus to patient's known acute diverticulitis with perforation.  Nasogastric tube with tip over the gastric fundus in the left upper quadrant and side-port in the region of the gastroesophageal junction. This could be  advanced another 6 cm.   Electronically Signed   By: Marin Olp M.D.   On: 06/05/2015 10:03   Ct Abdomen Pelvis W Contrast  06/08/2015   CLINICAL DATA:  History of diverticulitis. AP are onset severe worsening left lower quadrant pain. Nausea, emesis. Unable to drink contrast.  EXAM: CT ABDOMEN AND PELVIS WITH CONTRAST  TECHNIQUE: Multidetector CT imaging of the abdomen and pelvis was performed using the standard protocol following bolus administration of intravenous contrast.  CONTRAST:  100 cc Omnipaque 300  COMPARISON:  06/02/2015  FINDINGS: Lower chest: Coronary artery calcifications are present. Heart size is normal. The lung bases are unremarkable.  Upper abdomen: The gallbladder is present. Within the caudate lobe there is a cyst measuring 1.6 cm. No suspicious liver lesions. No focal abnormality identified within the spleen, pancreas, or adrenal glands. The gallbladder is present.  Gastrointestinal tract: The stomach has a normal appearance. There is diffuse dilatation of small bowel loops with transition zone best localized to the left lower quadrant. Findings are consistent with ileus. Contrast reaches the sigmoid and rectum. The distal small bowel loops are normal in caliber.  Within the left lower quadrant there is a small air-fluid collection which measures 5.1 x 3.2 cm. This is adjacent to a segment of sigmoid colon contain numerous diverticula and thickened wall. Findings are consistent with perforated segment of sigmoid colon related to diverticulitis. There is likely secondary inflammation of small bowel loops accounting for the functional obstruction.  Pelvis: Urinary bladder contains Foley catheter. The bladder wall appears slightly thickened and may be secondarily inflamed. There is a small amount of free pelvic fluid. Prostate gland appears slightly prominent in size. Seminal vesicles have a normal appearance.  Retroperitoneum: There is atherosclerosis of the abdominal aorta. No aneurysm.   Abdominal wall: Unremarkable.  Osseous structures: Schmorl's nodes and mild degenerative changes in the spine. No suspicious lytic or blastic lesions are identified.  IMPRESSION: 1. Perforation in the left lower quadrant related to diverticulitis. 2. Secondary inflammation of left lower quadrant small bowel loops creating ileus of small bowel loops. No obstruction. 3. Thickened bladder wall, likely secondarily inflamed. 4. Coronary artery disease. 5. Small liver cyst.  6. Abdominal aortic atherosclerosis. 7. Prostatic enlargement. 8. Critical Value/emergent results were called by telephone at the time of interpretation on 06/08/2015 at 3:07 pm to Dr. Phoebe Perch , who verbally acknowledged these results.   Electronically Signed   By: Nolon Nations M.D.   On: 06/08/2015 15:07   Dg Chest Port 1 View  06/14/2015   CLINICAL DATA:  Shortness of breath.  EXAM: PORTABLE CHEST - 1 VIEW  COMPARISON:  06/12/2015 chest radiograph.  FINDINGS: Left subclavian central venous catheter terminates in the middle third of the superior vena cava. Stable cardiomediastinal silhouette with top-normal heart size. No pneumothorax. No pleural effusion. Stable mild biapical pleural-parenchymal scarring. Stable mild left basilar atelectasis. No new lung opacity. No pulmonary edema.  IMPRESSION: Stable mild left basilar atelectasis. Otherwise no active disease in the chest.   Electronically Signed   By: Ilona Sorrel M.D.   On: 06/14/2015 09:54   Dg Chest Port 1 View  06/05/2015   CLINICAL DATA:  NG tube placement  EXAM: PORTABLE CHEST - 1 VIEW  COMPARISON:  None.  FINDINGS: NG tube is seen entering the stomach with the tip in the fundus. Mild cardiomegaly. No confluent airspace opacities or effusions. No acute bony abnormality.  IMPRESSION: No active disease.   Electronically Signed   By: Rolm Baptise M.D.   On: 06/05/2015 10:04   Dg Abd 2 Views  06/15/2015   CLINICAL DATA:  Inpatient. Recent surgery. Status post Hartmann's for  diverticulitis. Reflux.  EXAM: ABDOMEN - 2 VIEW  COMPARISON:  CT of the abdomen and pelvis 06/08/2015 and 06/06/2015  FINDINGS: Dilated small bowel loops are identified throughout the central abdomen. There is paucity of large bowel gas. Surgical clips overlie the lower central pelvis. Left lower quadrant ostomy. No free intraperitoneal air.  IMPRESSION: 1. Postoperative changes. 2. No evidence for free intraperitoneal air. 3. Persistent small bowel dilatation consistent with ileus or obstruction.   Electronically Signed   By: Nolon Nations M.D.   On: 06/15/2015 14:06    Assessment/Plan: Camerino Lazer is a 81 y.o. male with persistent leukocytosis in a patient now s/p Hartmans for perforated diverticuli performed 8/27. He had been on zosyn since admit. He has no fevers nor HD instability. He had cxr done 9/1 with only atelectasis, abd xray 9/2 with sb dilatation but no free air. He is on TPN through CVC, and has a foley in place Possible sources of the increased wbc include intrabd abscess, wound infection, line infection (bacteremia/candidemia since on TPN)., UTI with foley in place, C diff. All cultures negative. CT with some free fluid with mild enhancement. Has been on steroids for wheezing.  Zosyn was stopped 9/4. Cipro flagyl started 9/6.  WBC down a little   Recommendations Continue  cipro/flagyl for 7 days and the reevaluate. Wean steroids as appropriate Thank you very much for the consult. Will follow with you.  Higginson, Sorrel Cassetta   06/20/2015, 3:00 PM

## 2015-06-20 NOTE — Progress Notes (Signed)
Pt. Foley out on Sept 6 AM shift. Pt. having pain and discomfort with urination. Total urine per this shift 265 mls at amounts of 25-100 mls at a time. MD notified and Foley cath. ordered to be placed. Foley cath placed and 215 mls out.

## 2015-06-20 NOTE — Progress Notes (Signed)
Patient ID: Jesse Macias, male   DOB: [birth year], 81 y.o.   MRN: [id number]   Postoperative day #11.  No changes. Pain well controlled.  No nausea no vomiting ostomy is working. Tolerating full liquid diet.  Filed Vitals:   06/20/15 0004 06/20/15 0358 06/20/15 0621 06/20/15 0810  BP: 118/54  128/62 119/65  Pulse: 79  80 81  Temp: 98 F (36.7 C)   98 F (36.7 C)  TempSrc: Oral   Oral  Resp: 4   16  Height:      Weight:  162 lb 6.4 oz (73.664 kg)    SpO2: 99%   100%    Clinical examination alert and oriented. Lungs are clear. Abdomen soft and nontender ostomy is functional.  CBC Latest Ref Rng 06/20/2015 06/19/2015 06/18/2015  WBC 3.8 - 10.6 K/uL 26.5(H) 28.3(H) 25.5(H)  Hemoglobin 13.0 - 18.0 g/dL 9.6(L) 9.6(L) 9.1(L)  Hematocrit 40.0 - 52.0 % 28.2(L) 27.9(L) 27.0(L)  Platelets 150 - 440 K/uL 417 442(H) 440    The patient is now on Cipro and Flagyl.  Impression/Plan:   Slow improvement persistent leukocytosis. CT scan from the third demonstrates a nominal fluid collection within the pelvis. As he is now on intravenous antibiotics and his white count is somewhat diminished from yesterday I would continue his intravenous antibiotics and observe his white count. If leukocytosis persists despite antibiotics L repeat a CT scan on Friday. He may very well need percutaneous drainage at this case. Discussed with him and his wife as well as Dr. Ola Spurr in infectious disease and Dr. Manuella Ghazi medicine.

## 2015-06-20 NOTE — Progress Notes (Signed)
[facility] [place] NAME: Steveson Boling    MR#:  [id number]  DATE OF BIRTH:  26-Dec-1923  SUBJECTIVE:  Sodium 134, leukocytosis somewhat better, remains afebrile.  Wife at bedside. Looking somewhat better today REVIEW OF SYSTEMS:    Review of Systems  Constitutional: Negative for fever, chills and malaise/fatigue.  HENT: Negative for sore throat.   Eyes: Negative for blurred vision.  Respiratory: Positive for cough and shortness of breath. Negative for hemoptysis and wheezing.   Cardiovascular: Negative for chest pain, palpitations and leg swelling.  Gastrointestinal: Negative for nausea, vomiting, abdominal pain, diarrhea and blood in stool.  Genitourinary: Negative for dysuria.  Musculoskeletal: Negative for back pain.  Neurological: Negative for dizziness, tremors and headaches.  Endo/Heme/Allergies: Does not bruise/bleed easily.    Tolerating Diet: On Full Liquids  DRUG ALLERGIES:  No Known Allergies VITALS:  Blood pressure 119/65, pulse 81, temperature 98 F (36.7 C), temperature source Oral, resp. rate 16, height 5\' 5"  (1.651 m), weight 73.664 kg (162 lb 6.4 oz), SpO2 100 %. PHYSICAL EXAMINATION:   Physical Exam  Constitutional: He is well-developed, well-nourished, and in no distress. No distress.  HENT:  Head: Normocephalic.  Eyes: No scleral icterus.  Neck: Normal range of motion. Neck supple. No JVD present. No tracheal deviation present.  Cardiovascular: Normal rate, regular rhythm and normal heart sounds.  Exam reveals no gallop and no friction rub.   No murmur heard. Pulmonary/Chest: No accessory muscle usage. No tachypnea. No respiratory distress. He has decreased breath sounds. He has no wheezes. He has no rales. He exhibits no tenderness.  Abdominal: Soft. He exhibits no distension and no mass. There is tenderness. There is no rebound and no guarding.  Penrose drain  Musculoskeletal: Normal range of motion.  He exhibits no edema.  Skin: Skin is warm. No rash noted. No erythema.  Psychiatric: Affect and judgment normal.   LABORATORY PANEL:   CBC  Recent Labs Lab 06/20/15 0429  WBC 26.5*  HGB 9.6*  HCT 28.2*  PLT 417   ------------------------------------------------------------------------------------------------------------------  Chemistries   Recent Labs Lab 06/20/15 0429  NA 134*  K 3.5  CL 102  CO2 26  GLUCOSE 113*  BUN 69*  CREATININE 1.67*  CALCIUM 8.4*  MG 2.0   ASSESSMENT AND PLAN:  81 year old male with recurrent diverticulitis status post Hartmann procedure.  1. Rapid a.fib: now Heart rate is better controlled. Cardiology seen and following. Elquis is restarted. Pharmacy is assisting with this. continue metoprolol for rate control   2. Uncontrolled hypertension: Patient's blood pressure has much improved. Continue metoprolol, and hydralazine.  3. Hyponatremia: 134, appreciate nephrology input for fluid and electrolyte management  4. Acute respiratory failure: much better today - Likely from bilateral pleural effusion, and fluid overload.  tapering IV steroids on once daily,  Nebulizer PRN every 4 hours for now.  He is +18.9 L  5. Perforated diverticulitis: Patient is status post Hartman's procedure. As per surgery.    6. leukocytosis: Appreciate infectious disease input, patient is afebrile. This could be from steroids, continue Cipro and Flagyl - if it worsens on this, may need repeat CT  7. Acute renal failure: Nephrology consultation appreciated. 1.2->1.46->1.74->1.89->1.67  8. Glaucoma: Continue atenolol and XALATAN eyedrops   Management plans discussed with the patient, Dr. byrd , family and they are in agreement.  CODE STATUS: FULL  TOTAL TIME TAKING CARE OF THIS PATIENT: 15 minutes.    D/C plans per primary team.  Tolerating Full liquids   Brighton Delio M.D on 06/20/2015 at 1:30 PM  Between 7am to 6pm - Pager - [phone] After 6pm go to  [email or web address] - password [email or web address] [facility]  [facility]  Office  [phone]  CC:  Primary care physician; No primary care provider on file.

## 2015-06-20 NOTE — Progress Notes (Signed)
PARENTERAL NUTRITION CONSULT NOTE - FOLLOW UP  Pharmacy Consult for Electrolyte/glucose Indication: TPN  No Known Allergies  Patient Measurements: Height: 5\' 5"  (165.1 cm) Weight: 162 lb 6.4 oz (73.664 kg) IBW/kg (Calculated) : 61.5   Vital Signs: Temp: 98 F (36.7 C) (09/07 0810) Temp Source: Oral (09/07 0810) BP: 119/65 mmHg (09/07 0810) Pulse Rate: 81 (09/07 0810) Intake/Output from previous day: 09/06 0701 - 09/07 0700 In: 2828.3 [P.O.:240; I.V.:704; IV Piggyback:500; TPN:1384.3] Out: 1230 [Urine:1230] Intake/Output from this shift: Total I/O In: 111 [I.V.:40; IV Piggyback:71] Out: 275 [Emesis/NG output:275]  Labs:  Recent Labs  06/18/15 0740 06/19/15 0438 06/20/15 0429  WBC 25.5* 28.3* 26.5*  HGB 9.1* 9.6* 9.6*  HCT 27.0* 27.9* 28.2*  PLT 440 442* 417     Recent Labs  06/18/15 0740 06/19/15 0438 06/20/15 0429  NA 130* 128* 134*  K 3.3* 3.5  3.6 3.5  CL 97* 96* 102  CO2 26 25 26   GLUCOSE 201* 210* 113*  BUN 64* 72* 69*  CREATININE 1.74* 1.89* 1.67*  CALCIUM 8.6* 8.4* 8.4*  MG 2.1 2.0 2.0  PHOS 3.6 4.1 3.1   Estimated Creatinine Clearance: 26.1 mL/min (by C-G formula based on Cr of 1.67).    Recent Labs  06/19/15 1815 06/19/15 2154 06/20/15 0807  GLUCAP 122* 129* 116*     Insulin Requirements in the past 24 hours:  15 units  Current Nutrition:   Clinimix E 5/20 at 21mL/min  Assessment: Electrolytes are WNL, sodium 134 and trending up. Patient is being weaned off TPN and lipids. MD mentioned D/C of TPN and starting PO in 9/6 rounding note.    Plan:  Continue sliding scale insulin. Pharmacy will continue to monitor electrolyes and glucose.   Nancy Fetter, PharmD Pharmacy Resident

## 2015-06-20 NOTE — Progress Notes (Signed)
[facility]  ROUNDING NOTE   Subjective:   Wife and family at bedside. Foley catheter placed.  ID consulted: now on ciprofloxacin and metronidazole  Objective:  Vital signs in last 24 hours:  Temp:  [97.8 F (36.6 C)-98 F (36.7 C)] 98 F (36.7 C) (09/07 0810) Pulse Rate:  [79-91] 81 (09/07 0810) Resp:  [4-18] 16 (09/07 0810) BP: (118-141)/(54-65) 119/65 mmHg (09/07 0810) SpO2:  [98 %-100 %] 100 % (09/07 0810) Weight:  [73.664 kg (162 lb 6.4 oz)] 73.664 kg (162 lb 6.4 oz) (09/07 0358)  Weight change: 2.586 kg (5 lb 11.2 oz) Filed Weights   06/18/15 0504 06/19/15 0500 06/20/15 0358  Weight: 70.353 kg (155 lb 1.6 oz) 71.079 kg (156 lb 11.2 oz) 73.664 kg (162 lb 6.4 oz)    Intake/Output: I/O last 3 completed shifts: In: 6623.6 [P.O.:240; I.V.:1825.5; IV Piggyback:500] Out: 1980 [QJFHL:4562; Stool:50]   Intake/Output this shift:  Total I/O In: 471 [P.O.:360; I.V.:40; IV Piggyback:71] Out: 275 [Urine:275]  Physical Exam: General: NAD, elderly, fragile  Head: Normocephalic, atraumatic. Dry oral mucosal membranes  Eyes: Anicteric, PERRL  Neck: Supple, trachea midline  Lungs:  Clear to auscultation  Heart: Regular rate and rhythm  Abdomen:  Soft, nontender, mild distention  Extremities: no peripheral edema.  Neurologic: Nonfocal, moving all four extremities  Skin: No lesions  GU +foley    Basic Metabolic Panel:  Recent Labs Lab 06/16/15 0450 06/16/15 1243 06/17/15 0625 06/18/15 0740 06/19/15 0438 06/20/15 0429  NA 128* 125* 128* 130* 128* 134*  K 3.3* 4.0 3.5 3.3* 3.5  3.6 3.5  CL 97* 95* 95* 97* 96* 102  CO2 _0 GLUCOSE 157* 205* 230* 201* 210* 113*  BUN 44* 48* 55* 64* 72* 69*  CREATININE 1.32* 1.28* 1.46* 1.74* 1.89* 1.67*  CALCIUM 8.4* 8.3* 8.8* 8.6* 8.4* 8.4*  MG 1.6*  --  2.1 2.1 2.0 2.0  PHOS 2.5  --  2.8 3.6 4.1 3.1    Liver Function Tests: No results for input(s): AST, ALT, ALKPHOS, BILITOT, PROT, ALBUMIN in the  last 168 hours. No results for input(s): LIPASE, AMYLASE in the last 168 hours. No results for input(s): AMMONIA in the last 168 hours.  CBC:  Recent Labs Lab 06/16/15 0450 06/17/15 0625 06/18/15 0740 06/19/15 0438 06/20/15 0429  WBC 29.9* 25.6* 25.5* 28.3* 26.5*  NEUTROABS 26.2* 24.0* 23.2* 26.4* 23.0*  HGB 9.5* 9.3* 9.1* 9.6* 9.6*  HCT 27.9* 27.5* 27.0* 27.9* 28.2*  MCV 92.0 91.8 92.4 93.5 92.7  PLT 315 384 440 442* 417    Cardiac Enzymes: No results for input(s): CKTOTAL, CKMB, CKMBINDEX, TROPONINI in the last 168 hours.  BNP: Invalid input(s): POCBNP  CBG:  Recent Labs Lab 06/19/15 0538 06/19/15 1132 06/19/15 1815 06/19/15 2154 06/20/15 0807  GLUCAP 220* 208* 122* 129* 116*    Microbiology: Results for orders placed or performed during the hospital encounter of 06/02/15  MRSA PCR Screening     Status: None   Collection Time: 06/08/15 11:36 PM  Result Value Ref Range Status   MRSA by PCR NEGATIVE NEGATIVE Final    Comment:        The GeneXpert MRSA Assay (FDA approved for NASAL specimens only), is one component of a comprehensive MRSA colonization surveillance program. It is not intended to diagnose MRSA infection nor to guide or monitor treatment for MRSA infections.   Urine culture     Status: None   Collection Time: 06/15/15  3:45  PM  Result Value Ref Range Status   Specimen Description URINE, CLEAN CATCH  Final   Special Requests zosyn Normal  Final   Culture NO GROWTH 2 DAYS  Final   Report Status 06/17/2015 FINAL  Final  Culture, blood (routine x 2)     Status: None   Collection Time: 06/15/15  3:54 PM  Result Value Ref Range Status   Specimen Description BLOOD LEFT ASSIST CONTROL  Final   Special Requests BOTTLES DRAWN AEROBIC AND ANAEROBIC  6CC  Final   Culture NO GROWTH 5 DAYS  Final   Report Status 06/20/2015 FINAL  Final  Culture, blood (routine x 2)     Status: None   Collection Time: 06/15/15  4:00 PM  Result Value Ref Range  Status   Specimen Description BLOOD RIGHT ASSIST CONTROL  Final   Special Requests   Final    BOTTLES DRAWN AEROBIC AND ANAEROBIC  AER 113 CC ANA 11CC   Culture NO GROWTH 5 DAYS  Final   Report Status 06/20/2015 FINAL  Final    Coagulation Studies: No results for input(s): LABPROT, INR in the last 72 hours.  Urinalysis: No results for input(s): COLORURINE, LABSPEC, PHURINE, GLUCOSEU, HGBUR, BILIRUBINUR, KETONESUR, PROTEINUR, UROBILINOGEN, NITRITE, LEUKOCYTESUR in the last 72 hours.  Invalid input(s): APPERANCEUR    Imaging: No results found.   Medications:   . Marland KitchenTPN (CLINIMIX-E) Adult 40 mL/hr at 06/20/15 0105  . sodium chloride 30 mL/hr at 06/18/15 0859   . apixaban  2.5 mg Oral BID  . brimonidine  1 drop Both Eyes BID  . ciprofloxacin  400 mg Intravenous Q12H  . dorzolamide  1 drop Both Eyes BID  . hydrALAZINE  25 mg Oral 3 times per day  . hypromellose   Both Eyes QHS  . insulin aspart  0-15 Units Subcutaneous TID WC  . insulin aspart  0-5 Units Subcutaneous QHS  . latanoprost  1 drop Both Eyes QHS  . methylPREDNISolone (SOLU-MEDROL) injection  60 mg Intravenous Q24H  . metoprolol tartrate  25 mg Oral BID  . metronidazole  500 mg Intravenous Q8H  . sodium chloride  1 g Oral TID WC  . timolol  1 drop Both Eyes BID   [DISCONTINUED] acetaminophen **OR** acetaminophen, alum & mag hydroxide-simeth, calcium carbonate, hydrALAZINE, HYDROmorphone (DILAUDID) injection, ipratropium-albuterol, LORazepam, menthol-cetylpyridinium, metoprolol, ondansetron **OR** ondansetron (ZOFRAN) IV, sodium chloride  Assessment/ Plan:  Mr. Gerard Cantara is a 81 y.o. white male with hypertension, hyperlipidemia, peripheral neurpathy and glaucoma, was admitted to [facility] on 06/02/2015 with perforated diverticulits.   1. Acute renal failure on chronic kidney disease stage III with baseline creatinine of 1.7, eGFR of 38. CKD secondary to hypertension. CT without renal lesions - Continue to monitor  urine output, volume status and renal function. Nonoliguric output.  - on losartan as outpatient. Holding currently - Continue gentle IV fluids: NS at 58m/hr  2. Hyponatremia and hypokalemia : improved Na, potassium replaced - due to TPN.  - Continue sodium chloride.   3. Hypertension: holding losartan and hydrochlorothiazide.  - hydralazine, metoprolol and furosemide.    4. Anemia of CKD/blood loss: hemoglobin 9.6 - low threshold to start epo.     LOS: 1[place]9/7/201610:02 AM

## 2015-06-20 NOTE — Care Management Important Message (Signed)
Important Message  Patient Details  Name: Jesse Macias MRN: [id number] Date of Birth: 1924/05/14   Medicare Important Message Given:  Yes-third notification given    [name] 06/20/2015, 2:32 PM

## 2015-06-21 LAB — BASIC METABOLIC PANEL
Anion gap: 5 (ref 5–15)
BUN: 66 mg/dL — AB (ref 6–20)
CHLORIDE: 105 mmol/L (ref 101–111)
CO2: 25 mmol/L (ref 22–32)
CREATININE: 1.55 mg/dL — AB (ref 0.61–1.24)
Calcium: 8 mg/dL — ABNORMAL LOW (ref 8.9–10.3)
GFR calc Af Amer: 44 mL/min — ABNORMAL LOW (ref >60)
GFR calc non Af Amer: 38 mL/min — ABNORMAL LOW (ref >60)
Glucose, Bld: 89 mg/dL (ref 65–99)
Potassium: 3.8 mmol/L (ref 3.5–5.1)
SODIUM: 135 mmol/L (ref 135–145)

## 2015-06-21 LAB — CBC
HEMATOCRIT: 27 % — AB (ref 40.0–52.0)
HEMOGLOBIN: 8.9 g/dL — AB (ref 13.0–18.0)
MCH: 30.6 pg (ref 26.0–34.0)
MCHC: 33 g/dL (ref 32.0–36.0)
MCV: 92.8 fL (ref 80.0–100.0)
Platelets: 368 10*3/uL (ref 150–440)
RBC: 2.91 MIL/uL — AB (ref 4.40–5.90)
RDW: 15.7 % — ABNORMAL HIGH (ref 11.5–14.5)
WBC: 24.1 10*3/uL — ABNORMAL HIGH (ref 3.8–10.6)

## 2015-06-21 LAB — GLUCOSE, CAPILLARY
GLUCOSE-CAPILLARY: 94 mg/dL (ref 65–99)
GLUCOSE-CAPILLARY: 177 mg/dL — AB (ref 65–99)
Glucose-Capillary: 110 mg/dL — ABNORMAL HIGH (ref 65–99)
Glucose-Capillary: 140 mg/dL — ABNORMAL HIGH (ref 65–99)

## 2015-06-21 MED ORDER — FUROSEMIDE 10 MG/ML IJ SOLN
20.0000 mg | Freq: Once | INTRAMUSCULAR | Status: AC
Start: 2015-06-21 — End: 2015-06-21
  Administered 2015-06-21: 20 mg via INTRAVENOUS
  Filled 2015-06-21: qty 2

## 2015-06-21 NOTE — Progress Notes (Signed)
Patient ID: Jesse Macias, male   DOB: Jan 17, 1924, 81 y.o.   MRN: [id number]   [facility]   PATIENT NAME: Jesse Macias    MR#:  [id number]  DATE OF BIRTH:  August 07, 1924  SUBJECTIVE:   He is continuing to improve. Denies any pain. Wife is concerned about his lower extremity edema and elbow edema. REVIEW OF SYSTEMS:   Review of Systems  Gastrointestinal: Negative for abdominal pain, diarrhea and constipation.   Filed Vitals:   06/21/15 0749 06/21/15 1512 06/21/15 1522 06/21/15 1522  BP: 117/56  124/62 124/62  Pulse: 80 75  97  Temp: 97.7 F (36.5 C)   97.8 F (36.6 C)  TempSrc: Oral   Oral  Resp: 16   17  Height:      Weight:      SpO2: 98% 96%  96%   DRUG ALLERGIES:  No Known Allergies  VITALS:  Blood pressure 124/62, pulse 97, temperature 97.8 F (36.6 C), temperature source Oral, resp. rate 17, height 5\' 5"  (1.651 m), weight 162 lb (73.483 kg), SpO2 96 %.  PHYSICAL EXAMINATION:    The patient is alert and oriented. Abdomen is soft and nontender wound is clean ostomy is functional. Lower extremities demonstrate pitting edema as well as around the posterior upper arm and elbow. Consistent with dependent anasarca.  CBC Latest Ref Rng 06/21/2015 06/20/2015 06/19/2015  WBC 3.8 - 10.6 K/uL 24.1(H) 26.5(H) 28.3(H)  Hemoglobin 13.0 - 18.0 g/dL 8.9(L) 9.6(L) 9.6(L)  Hematocrit 40.0 - 52.0 % 27.0(L) 28.2(L) 27.9(L)  Platelets 150 - 440 K/uL 368 417 442(H)      ASSESSMENT AND PLAN:   The patient is making slow improvement.  White count is slowly drifting down with the initiation of intravenous Cipro and Flagyl.  The patient's wife states that he wants to go home with physical therapy. I do not think that he will do well with this. My recommendation was for inpatient rehabilitation upon discharge likely on Monday.  As the white count continues to slowly I will hold off on reimaging.  Will discuss with case management.

## 2015-06-21 NOTE — Progress Notes (Signed)
[facility]  ROUNDING NOTE   Subjective:   Wife at bedside.  Off TPN.  Tolerating PO diet  Objective:  Vital signs in last 24 hours:  Temp:  [97.7 F (36.5 C)-98.2 F (36.8 C)] 97.7 F (36.5 C) (09/08 0749) Pulse Rate:  [79-80] 80 (09/08 0749) Resp:  [16] 16 (09/08 0749) BP: (109-117)/(54-56) 117/56 mmHg (09/08 0749) SpO2:  [98 %-100 %] 98 % (09/08 0749) Weight:  [73.483 kg (162 lb)] 73.483 kg (162 lb) (09/08 0700)  Weight change: -0.181 kg (-6.4 oz) Filed Weights   06/19/15 0500 06/20/15 0358 06/21/15 0700  Weight: 71.079 kg (156 lb 11.2 oz) 73.664 kg (162 lb 6.4 oz) 73.483 kg (162 lb)    Intake/Output: I/O last 3 completed shifts: In: 3263.6 [P.O.:780; I.V.:727.5; IV Piggyback:571] Out: 1400 [Urine:1050; Stool:350]   Intake/Output this shift:  Total I/O In: -  Out: 500 [Urine:500]  Physical Exam: General: NAD, elderly, fragile  Head: Normocephalic, atraumatic. Dry oral mucosal membranes  Eyes: Anicteric, PERRL  Neck: Supple, trachea midline  Lungs:  Clear to auscultation  Heart: Regular rate and rhythm  Abdomen:  +ostomy, midline incision  Extremities: no peripheral edema.  Neurologic: Nonfocal, moving all four extremities  Skin: No lesions  GU +foley    Basic Metabolic Panel:  Recent Labs Lab 06/16/15 0450  06/17/15 0625 06/18/15 0740 06/19/15 0438 06/20/15 0429 06/21/15 0745  NA 128*  < > 128* 130* 128* 134* 135  K 3.3*  < > 3.5 3.3* 3.5  3.6 3.5 3.8  CL 97*  < > 95* 97* 96* 102 105  CO2 26  < > _0 GLUCOSE 157*  < > 230* 201* 210* 113* 89  BUN 44*  < > 55* 64* 72* 69* 66*  CREATININE 1.32*  < > 1.46* 1.74* 1.89* 1.67* 1.55*  CALCIUM 8.4*  < > 8.8* 8.6* 8.4* 8.4* 8.0*  MG 1.6*  --  2.1 2.1 2.0 2.0  --   PHOS 2.5  --  2.8 3.6 4.1 3.1  --   < > = values in this interval not displayed.  Liver Function Tests: No results for input(s): AST, ALT, ALKPHOS, BILITOT, PROT, ALBUMIN in the last 168 hours. No results for  input(s): LIPASE, AMYLASE in the last 168 hours. No results for input(s): AMMONIA in the last 168 hours.  CBC:  Recent Labs Lab 06/16/15 0450 06/17/15 0625 06/18/15 0740 06/19/15 0438 06/20/15 0429 06/21/15 0745  WBC 29.9* 25.6* 25.5* 28.3* 26.5* 24.1*  NEUTROABS 26.2* 24.0* 23.2* 26.4* 23.0*  --   HGB 9.5* 9.3* 9.1* 9.6* 9.6* 8.9*  HCT 27.9* 27.5* 27.0* 27.9* 28.2* 27.0*  MCV 92.0 91.8 92.4 93.5 92.7 92.8  PLT 315 384 440 442* 417 368    Cardiac Enzymes: No results for input(s): CKTOTAL, CKMB, CKMBINDEX, TROPONINI in the last 168 hours.  BNP: Invalid input(s): POCBNP  CBG:  Recent Labs Lab 06/20/15 0807 06/20/15 1200 06/20/15 1708 06/20/15 2155 06/21/15 0746  GLUCAP 116* 138* 143* 113* 94    Microbiology: Results for orders placed or performed during the hospital encounter of 06/02/15  MRSA PCR Screening     Status: None   Collection Time: 06/08/15 11:36 PM  Result Value Ref Range Status   MRSA by PCR NEGATIVE NEGATIVE Final    Comment:        The GeneXpert MRSA Assay (FDA approved for NASAL specimens only), is one component of a comprehensive MRSA colonization surveillance program. It is not  intended to diagnose MRSA infection nor to guide or monitor treatment for MRSA infections.   Urine culture     Status: None   Collection Time: 06/15/15  3:45 PM  Result Value Ref Range Status   Specimen Description URINE, CLEAN CATCH  Final   Special Requests zosyn Normal  Final   Culture NO GROWTH 2 DAYS  Final   Report Status 06/17/2015 FINAL  Final  Culture, blood (routine x 2)     Status: None   Collection Time: 06/15/15  3:54 PM  Result Value Ref Range Status   Specimen Description BLOOD LEFT ASSIST CONTROL  Final   Special Requests BOTTLES DRAWN AEROBIC AND ANAEROBIC  6CC  Final   Culture NO GROWTH 5 DAYS  Final   Report Status 06/20/2015 FINAL  Final  Culture, blood (routine x 2)     Status: None   Collection Time: 06/15/15  4:00 PM  Result Value  Ref Range Status   Specimen Description BLOOD RIGHT ASSIST CONTROL  Final   Special Requests   Final    BOTTLES DRAWN AEROBIC AND ANAEROBIC  AER 113 CC ANA 11CC   Culture NO GROWTH 5 DAYS  Final   Report Status 06/20/2015 FINAL  Final    Coagulation Studies: No results for input(s): LABPROT, INR in the last 72 hours.  Urinalysis: No results for input(s): COLORURINE, LABSPEC, PHURINE, GLUCOSEU, HGBUR, BILIRUBINUR, KETONESUR, PROTEINUR, UROBILINOGEN, NITRITE, LEUKOCYTESUR in the last 72 hours.  Invalid input(s): APPERANCEUR    Imaging: No results found.   Medications:   . sodium chloride 30 mL/hr at 06/21/15 0645   . apixaban  2.5 mg Oral BID  . brimonidine  1 drop Both Eyes BID  . ciprofloxacin  400 mg Intravenous Q12H  . dorzolamide  1 drop Both Eyes BID  . feeding supplement (ENSURE ENLIVE)  237 mL Oral BID AC  . hydrALAZINE  25 mg Oral 3 times per day  . hypromellose   Both Eyes QHS  . insulin aspart  0-15 Units Subcutaneous TID WC  . insulin aspart  0-5 Units Subcutaneous QHS  . latanoprost  1 drop Both Eyes QHS  . methylPREDNISolone (SOLU-MEDROL) injection  60 mg Intravenous Q24H  . metoprolol tartrate  25 mg Oral BID  . metronidazole  500 mg Intravenous Q8H  . sodium chloride  1 g Oral TID WC  . timolol  1 drop Both Eyes BID   [DISCONTINUED] acetaminophen **OR** acetaminophen, alum & mag hydroxide-simeth, calcium carbonate, hydrALAZINE, HYDROmorphone (DILAUDID) injection, ipratropium-albuterol, LORazepam, menthol-cetylpyridinium, metoprolol, ondansetron **OR** ondansetron (ZOFRAN) IV, sodium chloride  Assessment/ Plan:  [name] is a 81 y.o. white male with hypertension, hyperlipidemia, peripheral neurpathy and glaucoma, was admitted to [facility] on 06/02/2015 with perforated diverticulits.   1. Acute renal failure on chronic kidney disease stage III with baseline creatinine of 1.7, eGFR of 38. CKD secondary to hypertension. CT without renal lesions - Continue  to monitor urine output, volume status and renal function. Nonoliguric output.  - on losartan as outpatient. Holding currently - Continue gentle IV fluids: NS at 24m/hr  2. Hyponatremia and hypokalemia : improved Na, potassium  - Continue sodium chloride at 30/hr  3. Hypertension: holding losartan and hydrochlorothiazide.  - hydralazine, metoprolol and furosemide.    4. Anemia of CKD/blood loss: hemoglobin 8.9 - low threshold to start epo.     LOS: 1[place]9/8/201610:09 AM

## 2015-06-21 NOTE — Consult Note (Signed)
WOC ostomy follow up Stoma type/location: LLQ Colostomy   Stomal assessment/size: Not assessed today Peristomal assessment: intact Treatment options for stomal/peristomal skin:barrier ring  Output Soft brown stool Ostomy pouching: 2pc. 2 1/4" pouch Wife is not here today  Daughter is at bedside.  Reviewed emptying.  Pouch is intact and daughter would like for her mother to be here for ongoing teaching. Will meet in the AM for pouch change.   Education provided: Pouch emptying and obtaining supplies Enrolled patient in [employer] Discharge program: Yes [place] team will continue to follow.  Domenic Moras RN BSN [place] Pager [phone]

## 2015-06-21 NOTE — Progress Notes (Signed)
[name]    MR#:  [id number]  DATE OF BIRTH:  1924-06-20  SUBJECTIVE:  Sodium 135, leukocytosis continues to improve, remains afebrile.  Wife at bedside.  Tolerating soft diet REVIEW OF SYSTEMS:    Review of Systems  Constitutional: Negative for fever, chills and malaise/fatigue.  HENT: Negative for sore throat.   Eyes: Negative for blurred vision.  Respiratory: Positive for cough and shortness of breath. Negative for hemoptysis and wheezing.   Cardiovascular: Negative for chest pain, palpitations and leg swelling.  Gastrointestinal: Negative for nausea, vomiting, abdominal pain, diarrhea and blood in stool.  Genitourinary: Negative for dysuria.  Musculoskeletal: Negative for back pain.  Neurological: Negative for dizziness, tremors and headaches.  Endo/Heme/Allergies: Does not bruise/bleed easily.    Tolerating Diet: On Full Liquids  DRUG ALLERGIES:  No Known Allergies VITALS:  Blood pressure 117/56, pulse 80, temperature 97.7 F (36.5 C), temperature source Oral, resp. rate 16, height 5\' 5"  (1.651 m), weight 73.483 kg (162 lb), SpO2 98 %. PHYSICAL EXAMINATION:   Physical Exam  Constitutional: He is well-developed, well-nourished, and in no distress. No distress.  HENT:  Head: Normocephalic.  Eyes: No scleral icterus.  Neck: Normal range of motion. Neck supple. No JVD present. No tracheal deviation present.  Cardiovascular: Normal rate, regular rhythm and normal heart sounds.  Exam reveals no gallop and no friction rub.   No murmur heard. Pulmonary/Chest: No accessory muscle usage. No tachypnea. No respiratory distress. He has decreased breath sounds. He has no wheezes. He has no rales. He exhibits no tenderness.  Abdominal: Soft. He exhibits no distension and no mass. There is tenderness. There is no rebound and no guarding.  Penrose drain  Musculoskeletal: Normal range of motion. He  exhibits no edema.  Skin: Skin is warm. No rash noted. No erythema.  Psychiatric: Affect and judgment normal.   LABORATORY PANEL:   CBC  Recent Labs Lab 06/21/15 0745  WBC 24.1*  HGB 8.9*  HCT 27.0*  PLT 368   ------------------------------------------------------------------------------------------------------------------  Chemistries   Recent Labs Lab 06/20/15 0429 06/21/15 0745  NA 134* 135  K 3.5 3.8  CL 102 105  CO2 26 25  GLUCOSE 113* 89  BUN 69* 66*  CREATININE 1.67* 1.55*  CALCIUM 8.4* 8.0*  MG 2.0  --    ASSESSMENT AND PLAN:  81 year old male with recurrent diverticulitis status post Hartmann procedure.  1. Rapid a.fib: now Heart rate is better controlled. Cardiology seen and following. Elquis is restarted. Pharmacy is assisting with this. continue metoprolol for rate control   2. Uncontrolled hypertension: Patient's blood pressure has much improved. Continue metoprolol, and hydralazine.  3. Hyponatremia: 135, appreciate nephrology input for fluid and electrolyte management  4. Acute respiratory failure: much better today - Likely from bilateral pleural effusion, and fluid overload.  tapering IV steroids on once daily,  Nebulizer PRN every 4 hours for now.  He is +18.5 L  5. Perforated diverticulitis: Patient is status post Hartman's procedure. As per surgery.    6. leukocytosis: Appreciate infectious disease input, patient is afebrile. This could be from steroids, continue Cipro and Flagyl - if it worsens on this, may need repeat CT  7. Acute renal failure: Nephrology consultation appreciated. 1.2->1.46->1.74->1.89->1.67->1.55  8. Glaucoma: Continue atenolol and XALATAN eyedrops   Management plans discussed with the patient, Dr. byrd , family and they are in agreement.  CODE STATUS: FULL  TOTAL TIME TAKING CARE OF  THIS PATIENT: 15 minutes.    D/C plans per primary team. Tolerating soft diet   [facility], Jesse Macias M.D on 06/21/2015 at 1:40 PM  Between  7am to 6pm - Pager - [phone] After 6pm go to [email or web address] - password [email or web address] [facility]  [facility]  Office  [phone]  CC:  Primary care physician; No primary care provider on file.

## 2015-06-21 NOTE — Progress Notes (Signed)
Physical Therapy Treatment Patient Details Name: Jesse Macias MRN: [id number] DOB: 11/23/1923 Today's Date: 06/21/2015    History of Present Illness presented to ER with acute abdominal/LLQ pain; admitted with diverticulitis with microperforation.  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 Post-op course complicated by transfer to CCU due to afib with RVR (requiring amioderone drip).    PT Comments    Pt improving strength with exercises, transfers and ambulation. Improved posture with static/dynamic stand and ambulation. Pt fatigues with need for rest between exercises/activity, but improved tolerance overall for longer PT session today. Spouse present during session and please. Pt comfortable up in chair. Continue Pt for progression of strength, endurance and all functional mobility.    Follow Up Recommendations  SNF     Equipment Recommendations  Rolling walker with 5" wheels    Recommendations for Other Services       Precautions / Restrictions Restrictions Weight Bearing Restrictions: No    Mobility  Bed Mobility Overal bed mobility: Modified Independent Bed Mobility: Supine to Sit     Supine to sit: Modified independent (Device/Increase time)     General bed mobility comments:  (use of rails)  Transfers Overall transfer level: Needs assistance Equipment used: Rolling walker (2 wheeled) Transfers: Sit to/from Stand Sit to Stand: Min assist;Min guard (Min A from bed; Min guard from chair)            Ambulation/Gait Ambulation/Gait assistance: Min guard Ambulation Distance (Feet): 5 Feet Assistive device: Rolling walker (2 wheeled) Gait Pattern/deviations: Step-to pattern;Decreased step length - right;Decreased step length - left;Decreased dorsiflexion - right;Decreased dorsiflexion - left (less shuffling today; improved upright posture) Gait velocity: slow Gait velocity  interpretation: <1.8 ft/sec, indicative of risk for recurrent falls General Gait Details: Improving strength/steadiness with stand/ambulation   Stairs            Wheelchair Mobility    Modified Rankin (Stroke Patients Only)       Balance           Standing balance support: Bilateral upper extremity supported Standing balance-Leahy Scale: Fair                      Cognition Arousal/Alertness: Awake/alert Behavior During Therapy: WFL for tasks assessed/performed Overall Cognitive Status: Within Functional Limits for tasks assessed                      Exercises General Exercises - Lower Extremity Ankle Circles/Pumps: AROM;Both;20 reps;Supine Quad Sets: Strengthening;Both;20 reps;Supine Gluteal Sets: Strengthening;Both;20 reps;Supine Short Arc Quad: AROM;Both;20 reps;Supine Heel Slides: AROM;Both;20 reps;Supine Hip ABduction/ADduction: Both;20 reps;Supine;Strengthening Straight Leg Raises: AAROM;Both;20 reps;Supine Other Exercises Other Exercises: B ham curl with resist, supine    General Comments        Pertinent Vitals/Pain Pain Assessment: No/denies pain    Home Living                      Prior Function            PT Goals (current goals can now be found in the care plan section) Progress towards PT goals: Progressing toward goals    Frequency  Min 2X/week    PT Plan Current plan remains appropriate    Co-evaluation             End of Session Equipment Utilized During Treatment: Gait belt;Oxygen Activity Tolerance: Patient limited by fatigue (requires rest breaks) Patient left: in chair;with  call bell/phone within reach;with chair alarm set;with family/visitor present     Time: [id number] PT Time Calculation (min) (ACUTE ONLY): 41 min  Charges:  $Gait Training: 8-22 mins $Therapeutic Exercise: 8-22 mins                    G Codes:      [name] 06/21/2015, 3:55 PM

## 2015-06-22 LAB — GLUCOSE, CAPILLARY
GLUCOSE-CAPILLARY: 105 mg/dL — AB (ref 65–99)
GLUCOSE-CAPILLARY: 171 mg/dL — AB (ref 65–99)
GLUCOSE-CAPILLARY: 129 mg/dL — AB (ref 65–99)
Glucose-Capillary: 117 mg/dL — ABNORMAL HIGH (ref 65–99)

## 2015-06-22 LAB — COMPREHENSIVE METABOLIC PANEL
ALBUMIN: 1.9 g/dL — AB (ref 3.5–5.0)
ALT: 56 U/L (ref 17–63)
ANION GAP: 5 (ref 5–15)
AST: 29 U/L (ref 15–41)
Alkaline Phosphatase: 80 U/L (ref 38–126)
BILIRUBIN TOTAL: 1.3 mg/dL — AB (ref 0.3–1.2)
BUN: 61 mg/dL — ABNORMAL HIGH (ref 6–20)
CO2: 25 mmol/L (ref 22–32)
Calcium: 7.9 mg/dL — ABNORMAL LOW (ref 8.9–10.3)
Chloride: 108 mmol/L (ref 101–111)
Creatinine, Ser: 1.48 mg/dL — ABNORMAL HIGH (ref 0.61–1.24)
GFR calc Af Amer: 47 mL/min — ABNORMAL LOW (ref >60)
GFR, EST NON AFRICAN AMERICAN: 40 mL/min — AB (ref >60)
GLUCOSE: 112 mg/dL — AB (ref 65–99)
POTASSIUM: 3.8 mmol/L (ref 3.5–5.1)
Sodium: 138 mmol/L (ref 135–145)
TOTAL PROTEIN: 4 g/dL — AB (ref 6.5–8.1)

## 2015-06-22 LAB — CBC
HEMATOCRIT: 26.4 % — AB (ref 40.0–52.0)
HEMOGLOBIN: 8.9 g/dL — AB (ref 13.0–18.0)
MCH: 31.4 pg (ref 26.0–34.0)
MCHC: 33.6 g/dL (ref 32.0–36.0)
MCV: 93.6 fL (ref 80.0–100.0)
Platelets: 353 10*3/uL (ref 150–440)
RBC: 2.82 MIL/uL — AB (ref 4.40–5.90)
RDW: 16 % — ABNORMAL HIGH (ref 11.5–14.5)
WBC: 20.4 10*3/uL — AB (ref 3.8–10.6)

## 2015-06-22 MED ORDER — LUBRIFRESH P.M. OP OINT
TOPICAL_OINTMENT | Freq: Every evening | OPHTHALMIC | Status: DC | PRN
  Administered 2015-06-23 – 2015-06-26 (×3): via OPHTHALMIC
  Filled 2015-06-22: qty 3.5

## 2015-06-22 MED ORDER — HYDROCODONE-ACETAMINOPHEN 5-325 MG PO TABS
1.0000 | ORAL_TABLET | Freq: Four times a day (QID) | ORAL | Status: DC | PRN
  Administered 2015-06-24: 1 via ORAL
  Administered 2015-06-24: 2 via ORAL
  Filled 2015-06-22: qty 2
  Filled 2015-06-22: qty 1

## 2015-06-22 MED ORDER — FLEET ENEMA 7-19 GM/118ML RE ENEM
1.0000 | ENEMA | Freq: Once | RECTAL | Status: AC
  Administered 2015-06-22: 1 via RECTAL

## 2015-06-22 MED ORDER — APIXABAN 5 MG PO TABS
5.0000 mg | ORAL_TABLET | Freq: Two times a day (BID) | ORAL | Status: DC
  Administered 2015-06-22 – 2015-06-26 (×9): 5 mg via ORAL
  Filled 2015-06-22 (×9): qty 1

## 2015-06-22 NOTE — Progress Notes (Signed)
Physical Therapy Treatment Patient Details Name: Jesse Macias MRN: [id number] DOB: [birth year] Today's Date: 06/22/2015    History of Present Illness presented to ER with acute abdominal/LLQ pain; admitted with diverticulitis with microperforation.  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 Post-op course complicated by transfer to CCU due to afib with RVR (requiring amioderone drip).    PT Comments    Pt is easily fatigued today during therapy. He is able to increase his ambulation distance at edge of bed but requires seated rest break due to fatigue. SaO2 remains >95% on room air during all exertion. Pt requires minA+1 during retro ambulation at bedside due to poor balance and posterior leaning. Therapist present to prevent falls. Pt is able to complete all bed exercises and at end of treatment session reports full resolution of LLQ abdominal pain. Pt will need SNF placement at discharge. Pt will benefit from skilled PT services to address deficits in strength, balance, and mobility in order to return to full function at home.    Follow Up Recommendations  SNF     Equipment Recommendations  Rolling walker with 5" wheels (Further assessed by SNF)    Recommendations for Other Services       Precautions / Restrictions Precautions Precautions: Fall Restrictions Weight Bearing Restrictions: No    Mobility  Bed Mobility Overal bed mobility: Needs Assistance Bed Mobility: Supine to Sit     Supine to sit: Min assist     General bed mobility comments: Pt needs UE assist to come from partial sidelying to sitting due to weakness/pain  Transfers Overall transfer level: Needs assistance Equipment used: Rolling walker (2 wheeled) Transfers: Sit to/from Stand Sit to Stand: Min guard         General transfer comment: Pt demonstrates decreased LE power and strength with sit to stand. He takes  extended time to come to standing with verbal and tactile cues for safe hand placement. Once upright pt able to maintain balance without assistance  Ambulation/Gait Ambulation/Gait assistance: Min assist Ambulation Distance (Feet): 20 Feet (10'x2) Assistive device: Rolling walker (2 wheeled) Gait Pattern/deviations: Decreased step length - right;Decreased step length - left   Gait velocity interpretation: <1.8 ft/sec, indicative of risk for recurrent falls General Gait Details: Pt takes forward and backward steps with rolling walker at EOB. He demonstrates decreased LE strength with mild buckling as well as poor balance during retro ambulation leaning posterior frequently and requiring therapist assist to keep him from falling. All ambulation perform on room and and SaO2 remains >95%. Pt does report considerable fatigue with gait and requires seated rest break prior to second bout of ambulation.   Stairs            Wheelchair Mobility    Modified Rankin (Stroke Patients Only)       Balance Overall balance assessment: Needs assistance   Sitting balance-Leahy Scale: Good       Standing balance-Leahy Scale: Fair                      Cognition Arousal/Alertness: Awake/alert Behavior During Therapy: WFL for tasks assessed/performed Overall Cognitive Status: Within Functional Limits for tasks assessed                      Exercises General Exercises - Lower Extremity Ankle Circles/Pumps: Both;Supine;Strengthening;10 reps Quad Sets: Strengthening;Both;Supine;10 reps Gluteal Sets: Strengthening;Both;Supine;10 reps Heel Slides: Both;Supine;Strengthening;10 reps Hip ABduction/ADduction: Both;Supine;Strengthening;10 reps Straight Leg Raises:  AAROM;Both;Supine;10 reps Other Exercises Other Exercises: Standing marches at EOB x 10    General Comments        Pertinent Vitals/Pain Pain Assessment: 0-10 Pain Score: 3  Pain Location: LLQ abdomen Pain  Intervention(s): Monitored during session;Premedicated before session (RN has recently administered pain meds prior to PT)    Home Living                      Prior Function            PT Goals (current goals can now be found in the care plan section) Acute Rehab PT Goals Patient Stated Goal: "to try to get up" PT Goal Formulation: With patient Time For Goal Achievement: 06/27/15 Potential to Achieve Goals: Good Progress towards PT goals: Progressing toward goals    Frequency  Min 2X/week    PT Plan Current plan remains appropriate    Co-evaluation             End of Session Equipment Utilized During Treatment: Gait belt;Oxygen (O2 discontinued) Activity Tolerance: Patient limited by fatigue Patient left: in chair;with call bell/phone within reach;with chair alarm set;with family/visitor present     Time: [id number] PT Time Calculation (min) (ACUTE ONLY): 27 min  Charges:  $Gait Training: 8-22 mins $Therapeutic Exercise: 8-22 mins                    G Codes:      Lyndel Safe Huprich PT, DPT   Huprich,Jason 06/22/2015, 11:27 AM

## 2015-06-22 NOTE — Progress Notes (Signed)
[facility] PROGRESS NOTE Date of Admission:  06/02/2015     ID: Jesse Macias is a 81 y.o. male with perforated diverticuli  Active Problems:   Acute diverticulitis   Perforated diverticulum of large intestine   Ileus   Subjective: More abd pain and some distention. No fevers ROS  Eleven systems are reviewed and negative except per hpi  Medications:  Antibiotics Given (last 72 hours)    Date/Time Action Medication Dose Rate   06/19/15 2044 Given   metroNIDAZOLE (FLAGYL) IVPB 500 mg 500 mg 100 mL/hr   06/20/15 0010 Given   ciprofloxacin (CIPRO) IVPB 400 mg 400 mg 200 mL/hr   06/20/15 0439 Given   metroNIDAZOLE (FLAGYL) IVPB 500 mg 500 mg 100 mL/hr   06/20/15 1246 Given   ciprofloxacin (CIPRO) IVPB 400 mg 400 mg 200 mL/hr   06/20/15 1249 Given   metroNIDAZOLE (FLAGYL) IVPB 500 mg 500 mg 100 mL/hr   06/20/15 2036 Given   metroNIDAZOLE (FLAGYL) IVPB 500 mg 500 mg 100 mL/hr   06/20/15 2357 Given   ciprofloxacin (CIPRO) IVPB 400 mg 400 mg 200 mL/hr   06/21/15 0410 Given   metroNIDAZOLE (FLAGYL) IVPB 500 mg 500 mg 100 mL/hr   06/21/15 1245 Given   ciprofloxacin (CIPRO) IVPB 400 mg 400 mg 200 mL/hr   06/21/15 1245 Given   metroNIDAZOLE (FLAGYL) IVPB 500 mg 500 mg 100 mL/hr   06/21/15 2141 Given   metroNIDAZOLE (FLAGYL) IVPB 500 mg 500 mg 100 mL/hr   06/21/15 2333 Given   ciprofloxacin (CIPRO) IVPB 400 mg 400 mg 200 mL/hr   06/22/15 0439 Given   metroNIDAZOLE (FLAGYL) IVPB 500 mg 500 mg 100 mL/hr   06/22/15 1320 Given   ciprofloxacin (CIPRO) IVPB 400 mg 400 mg 200 mL/hr   06/22/15 1321 Given   metroNIDAZOLE (FLAGYL) IVPB 500 mg 500 mg 100 mL/hr     . apixaban  5 mg Oral BID  . brimonidine  1 drop Both Eyes BID  . ciprofloxacin  400 mg Intravenous Q12H  . dorzolamide  1 drop Both Eyes BID  . feeding supplement (ENSURE ENLIVE)  237 mL Oral BID AC  . hydrALAZINE  25 mg Oral 3 times per day  . insulin aspart  0-15 Units Subcutaneous TID WC  . insulin  aspart  0-5 Units Subcutaneous QHS  . latanoprost  1 drop Both Eyes QHS  . metoprolol tartrate  25 mg Oral BID  . metronidazole  500 mg Intravenous Q8H  . sodium chloride  1 g Oral TID WC  . sodium phosphate  1 enema Rectal Once  . timolol  1 drop Both Eyes BID    Objective: Vital signs in last 24 hours: Temp:  [97.6 F (36.4 C)-98 F (36.7 C)] 97.6 F (36.4 C) (09/09 0656) Pulse Rate:  [69-97] 71 (09/09 0656) Resp:  [16-20] 16 (09/09 0656) BP: (122-139)/(59-69) 129/63 mmHg (09/09 0656) SpO2:  [96 %-100 %] 100 % (09/09 0656) Weight:  [75.978 kg (167 lb 8 oz)] 75.978 kg (167 lb 8 oz) (09/09 0500) Constitutional: very frail, chronically ill appearing HENT:  Mouth/Throat: Oropharynx is clear and dry . No oropharyngeal exudate.  Cardiovascular: Normal rate, regular rhythm and normal heart sounds.  Pulmonary/Chest: Effort normal bil rhonchi  Abdominal: Soft. abd is distended hyperactvive bs, he has midline incision with staples in place but relatively dry  Lymphadenopathy: He has no cervical adenopathy.  Neurological: He is alert and oriented to person, place, and time.  Skin: Skin is  warm and dry. Mult ecchymosis Access - L chest CVC Foley cath  Lab Results  Recent Labs  06/21/15 0745 06/22/15 0429  WBC 24.1* 20.4*  HGB 8.9* 8.9*  HCT 27.0* 26.4*  NA 135 138  K 3.8 3.8  CL 105 108  CO2 25 25  BUN 66* 61*  CREATININE 1.55* 1.48*    Microbiology: Results for orders placed or performed during the hospital encounter of 06/02/15  MRSA PCR Screening     Status: None   Collection Time: 06/08/15 11:36 PM  Result Value Ref Range Status   MRSA by PCR NEGATIVE NEGATIVE Final    Comment:        The GeneXpert MRSA Assay (FDA approved for NASAL specimens only), is one component of a comprehensive MRSA colonization surveillance program. It is not intended to diagnose MRSA infection nor to guide or monitor treatment for MRSA infections.   Urine culture     Status:  None   Collection Time: 06/15/15  3:45 PM  Result Value Ref Range Status   Specimen Description URINE, CLEAN CATCH  Final   Special Requests zosyn Normal  Final   Culture NO GROWTH 2 DAYS  Final   Report Status 06/17/2015 FINAL  Final  Culture, blood (routine x 2)     Status: None   Collection Time: 06/15/15  3:54 PM  Result Value Ref Range Status   Specimen Description BLOOD LEFT ASSIST CONTROL  Final   Special Requests BOTTLES DRAWN AEROBIC AND ANAEROBIC  6CC  Final   Culture NO GROWTH 5 DAYS  Final   Report Status 06/20/2015 FINAL  Final  Culture, blood (routine x 2)     Status: None   Collection Time: 06/15/15  4:00 PM  Result Value Ref Range Status   Specimen Description BLOOD RIGHT ASSIST CONTROL  Final   Special Requests   Final    BOTTLES DRAWN AEROBIC AND ANAEROBIC  AER 113 CC ANA 11CC   Culture NO GROWTH 5 DAYS  Final   Report Status 06/20/2015 FINAL  Final    Studies/Results:  Ct Abdomen Pelvis Wo Contrast  06/16/2015   CLINICAL DATA:  Elevated white blood cell count. History of perforated diverticulitis with Hartmann's pouch 06/09/2015. LEFT lower quadrant colostomy.  EXAM: CT ABDOMEN AND PELVIS WITHOUT CONTRAST  TECHNIQUE: Multidetector CT imaging of the abdomen and pelvis was performed following the standard protocol without IV contrast.  COMPARISON:  CT 06/10/2015  FINDINGS: Lower chest: Mild bibasilar effusions.  Hepatobiliary: Small hepatic cysts in the caudate lobe. No biliary duct dilatation. The gallbladder is normal.  Pancreas: Pancreas is normal. No ductal dilatation. No pancreatic inflammation.  Spleen: Normal spleen  Adrenals/urinary tract: Adrenal glands are normal. Simple fluid attenuation lesion upper pole of the RIGHT kidney. No renal obstruction. Foley catheter within the bladder. Small amount a gas within the bladder likely related to catheterization.  Stomach/Bowel: Stomach and duodenum are normal. The proximal small bowel mildly dilated 3.4 cm. There is poor  progression of the oral contrast. The distal small bowel is collapsed over a fairly long segment leading up to the terminal ileum. No transition point identified. The ascending transverse and proximal descending colon are collapsed. Colostomy appears normal in LEFT lower quadrant. Hartmann's pouch is intact.  Small amount fluid in LEFT lower quadrant along of small bowel loops (image 62, series 2). There is mild enhancement of the peritoneal surface at this level. This fluid collection is not well organized. This is site of perforated diverticulitis  on comparison CT  Vascular/Lymphatic: Abdominal aorta is normal caliber with atherosclerotic calcification. There is no retroperitoneal or periportal lymphadenopathy. No pelvic lymphadenopathy.  Reproductive: Prostate normal.  Musculoskeletal: No aggressive osseous lesion.  Other: There is a midline surgical drain extending vertically within the subcutaneous tissue. No evidence abscess.  IMPRESSION: 1. Poor progression of the oral contrast through the small bowel with a caliber change from mildly dilated proximal small bowel to collapsed distal small bowel and colon. Favor small bowel ileus over obstruction however consider follow-up radiographs to evaluate for potential developing obstruction. 2. Small amount free fluid in the LEFT lower quadrant does have thin enhancing rim but is not well organized. The findings suggest mild peritonitis over abscess. Recommend attention on follow-up. This was the site of perforated diverticulitis. 3. Hartmann's pouch appears normal. 4. Bilateral pleural effusions.   Electronically Signed   By: Suzy Bouchard M.D.   On: 06/16/2015 10:57   Ct Abdomen Pelvis Wo Contrast  06/02/2015   CLINICAL DATA:  Left lower quadrant pain  EXAM: CT ABDOMEN AND PELVIS WITHOUT CONTRAST  TECHNIQUE: Multidetector CT imaging of the abdomen and pelvis was performed following the standard protocol without IV contrast.  COMPARISON:  09/28/2008  FINDINGS:  BODY WALL: Status post right inguinal hernia repair.  LOWER CHEST:  Gastroesophageal reflux with small hiatal hernia and prominent circumferential lower esophageal thickening. No surrounding adenopathy.  Cluster of nodules in the right middle lobe appear postinflammatory.  ABDOMEN/PELVIS:  Liver: Incidental cyst in the caudate lobe. No significant findings.  Biliary: No evidence of biliary obstruction or stone.  Pancreas: Unremarkable.  Spleen: Unremarkable.  Adrenals: Unremarkable.  Kidneys and ureters: No hydronephrosis or stone. Smooth bilateral renal atrophy. 22 mm cyst in the upper pole right kidney.  Bladder: Bladder wall thickening with cellules, chronic outlet obstruction.  Reproductive: Symmetric moderate enlargement of the prostate.  Bowel: No obstruction. No appendicitis.Distal colonic diverticulosis with focal active inflammation at the descending sigmoid junction where there is extraluminal gas. Small bubbles of pneumoperitoneum present in the left abdomen. No indication of abscess.  Retroperitoneum: No mass or adenopathy.  Vascular: No acute abnormality.  OSSEOUS: No acute abnormalities. Calcification ventral to the left hip could be soft tissue or bursal.  Critical Value/emergent results were called by telephone at the time of interpretation on 06/02/2015 at 11:01 pm to Dr. Lavonia Drafts , who verbally acknowledged these results.  IMPRESSION: 1. Perforated diverticulitis at the sigmoid descending junction with small pneumoperitoneum. No fluid collection. 2. Marked thickening of the distal esophagus with small hiatal hernia. This could reflect esophagitis or neoplasm. 3. Chronic bladder outlet obstruction.   Electronically Signed   By: Monte Fantasia M.D.   On: 06/02/2015 23:02   Dg Chest 1 View  06/12/2015   CLINICAL DATA:  Shortness of breath  EXAM: CHEST  1 VIEW  COMPARISON:  06/09/2015  FINDINGS: Left central line tip is in the SVC. NG tube enters the stomach. Mild cardiomegaly. No confluent  airspace opacities or effusions. No acute bony abnormality.  IMPRESSION: Mild cardiomegaly.  No active disease.   Electronically Signed   By: Rolm Baptise M.D.   On: 06/12/2015 12:55   X-ray Chest Pa Or Ap  06/09/2015   CLINICAL DATA:  Central line placement.  EXAM: CHEST  1 VIEW  COMPARISON:  06/05/2015  FINDINGS: New left subclavian central venous line has its tip in the mid superior vena cava. No pneumothorax.  Mild medial right lung base atelectasis. No lung consolidation or edema.  Orogastric tube is stable passing below the diaphragm into the stomach.  IMPRESSION: 1. Left subclavian central venous line tip lies in the mid superior vena cava. No pneumothorax. No other change from the prior exam. No acute findings in the lungs.   Electronically Signed   By: Lajean Manes M.D.   On: 06/09/2015 11:55   Dg Abd 1 View  06/06/2015   CLINICAL DATA:  Ileus  EXAM: ABDOMEN - 1 VIEW  COMPARISON:  06/05/2015  FINDINGS: Persistent small bowel dilatation is noted although some mild decrease in the small bowel diameter is noted. Contrast material is again seen throughout colon. No free air is seen. Mild degenerative changes of the lumbar spine are noted.  IMPRESSION: Slight improvement in the degree of small bowel dilatation   Electronically Signed   By: Inez Catalina M.D.   On: 06/06/2015 12:06   Dg Abd 1 View  06/05/2015   CLINICAL DATA:  Abdominal pain, NG tube placement.  EXAM: ABDOMEN - 1 VIEW  COMPARISON:  CT 06/02/2015  FINDINGS: Nasogastric tube is present with tip over the stomach in the left upper quadrant and side-port in the region of the gastroesophageal junction. This could be advanced another 6 cm.  Examination demonstrates air and contrast throughout the colon. There are several air-filled dilated small bowel loops in the central abdomen measuring up to 4.3 cm in diameter likely secondary ileus due to patient's known acute sigmoid diverticulitis with perforation. There mild degenerate changes of the  spine and hips. Pelvic phleboliths are present.  IMPRESSION: Centralized air-filled dilated small bowel loops likely secondary ileus to patient's known acute diverticulitis with perforation.  Nasogastric tube with tip over the gastric fundus in the left upper quadrant and side-port in the region of the gastroesophageal junction. This could be advanced another 6 cm.   Electronically Signed   By: Marin Olp M.D.   On: 06/05/2015 10:03   Ct Abdomen Pelvis W Contrast  06/08/2015   CLINICAL DATA:  History of diverticulitis. AP are onset severe worsening left lower quadrant pain. Nausea, emesis. Unable to drink contrast.  EXAM: CT ABDOMEN AND PELVIS WITH CONTRAST  TECHNIQUE: Multidetector CT imaging of the abdomen and pelvis was performed using the standard protocol following bolus administration of intravenous contrast.  CONTRAST:  100 cc Omnipaque 300  COMPARISON:  06/02/2015  FINDINGS: Lower chest: Coronary artery calcifications are present. Heart size is normal. The lung bases are unremarkable.  Upper abdomen: The gallbladder is present. Within the caudate lobe there is a cyst measuring 1.6 cm. No suspicious liver lesions. No focal abnormality identified within the spleen, pancreas, or adrenal glands. The gallbladder is present.  Gastrointestinal tract: The stomach has a normal appearance. There is diffuse dilatation of small bowel loops with transition zone best localized to the left lower quadrant. Findings are consistent with ileus. Contrast reaches the sigmoid and rectum. The distal small bowel loops are normal in caliber.  Within the left lower quadrant there is a small air-fluid collection which measures 5.1 x 3.2 cm. This is adjacent to a segment of sigmoid colon contain numerous diverticula and thickened wall. Findings are consistent with perforated segment of sigmoid colon related to diverticulitis. There is likely secondary inflammation of small bowel loops accounting for the functional obstruction.   Pelvis: Urinary bladder contains Foley catheter. The bladder wall appears slightly thickened and may be secondarily inflamed. There is a small amount of free pelvic fluid. Prostate gland appears slightly prominent in size. Seminal vesicles have  a normal appearance.  Retroperitoneum: There is atherosclerosis of the abdominal aorta. No aneurysm.  Abdominal wall: Unremarkable.  Osseous structures: Schmorl's nodes and mild degenerative changes in the spine. No suspicious lytic or blastic lesions are identified.  IMPRESSION: 1. Perforation in the left lower quadrant related to diverticulitis. 2. Secondary inflammation of left lower quadrant small bowel loops creating ileus of small bowel loops. No obstruction. 3. Thickened bladder wall, likely secondarily inflamed. 4. Coronary artery disease. 5. Small liver cyst. 6. Abdominal aortic atherosclerosis. 7. Prostatic enlargement. 8. Critical Value/emergent results were called by telephone at the time of interpretation on 06/08/2015 at 3:07 pm to Dr. Phoebe Perch , who verbally acknowledged these results.   Electronically Signed   By: Nolon Nations M.D.   On: 06/08/2015 15:07   Dg Chest Port 1 View  06/14/2015   CLINICAL DATA:  Shortness of breath.  EXAM: PORTABLE CHEST - 1 VIEW  COMPARISON:  06/12/2015 chest radiograph.  FINDINGS: Left subclavian central venous catheter terminates in the middle third of the superior vena cava. Stable cardiomediastinal silhouette with top-normal heart size. No pneumothorax. No pleural effusion. Stable mild biapical pleural-parenchymal scarring. Stable mild left basilar atelectasis. No new lung opacity. No pulmonary edema.  IMPRESSION: Stable mild left basilar atelectasis. Otherwise no active disease in the chest.   Electronically Signed   By: Ilona Sorrel M.D.   On: 06/14/2015 09:54   Dg Chest Port 1 View  06/05/2015   CLINICAL DATA:  NG tube placement  EXAM: PORTABLE CHEST - 1 VIEW  COMPARISON:  None.  FINDINGS: NG tube is seen  entering the stomach with the tip in the fundus. Mild cardiomegaly. No confluent airspace opacities or effusions. No acute bony abnormality.  IMPRESSION: No active disease.   Electronically Signed   By: Rolm Baptise M.D.   On: 06/05/2015 10:04   Dg Abd 2 Views  06/15/2015   CLINICAL DATA:  Inpatient. Recent surgery. Status post Hartmann's for diverticulitis. Reflux.  EXAM: ABDOMEN - 2 VIEW  COMPARISON:  CT of the abdomen and pelvis 06/08/2015 and 06/06/2015  FINDINGS: Dilated small bowel loops are identified throughout the central abdomen. There is paucity of large bowel gas. Surgical clips overlie the lower central pelvis. Left lower quadrant ostomy. No free intraperitoneal air.  IMPRESSION: 1. Postoperative changes. 2. No evidence for free intraperitoneal air. 3. Persistent small bowel dilatation consistent with ileus or obstruction.   Electronically Signed   By: Nolon Nations M.D.   On: 06/15/2015 14:06    Assessment/Plan: Daymion Deal is a 81 y.o. male with persistent leukocytosis in a patient now s/p Hartmans for perforated diverticuli performed 8/27. He had been on zosyn since admit. He has no fevers nor HD instability. He had cxr done 9/1 with only atelectasis, abd xray 9/2 with sb dilatation but no free air. He is on TPN through CVC, and has a foley in place Possible sources of the increased wbc include intrabd abscess, wound infection, line infection (bacteremia/candidemia since on TPN)., UTI with foley in place, C diff. All cultures negative. CT with some free fluid with mild enhancement. Has been on steroids for wheezing.  Zosyn was stopped 9/4. Cipro flagyl started 9/6.  WBC down to 20.  Off steroids Having abd pain - seems like some obstruction   Recommendations Continue  cipro/flagyl until 9/13 then reevaluate. If worsens of wbc elevates off Steroids would repeat CT abd  Thank you very much for the consult. Will follow with you.  [place], [place]  06/22/2015, 2:38  PM

## 2015-06-22 NOTE — Progress Notes (Signed)
Patient ID: Jesse Macias, male   DOB: Feb 16, 1924, 81 y.o.   MRN: [id number]   POD 12  C/o of need to have a BM per rectum Tolerating diet  Filed Vitals:   06/21/15 2351 06/22/15 0500 06/22/15 0656 06/22/15 0656  BP: 122/59  129/63 129/63  Pulse: 69   71  Temp: 98 F (36.7 C)   97.6 F (36.4 C)  TempSrc: Oral   Oral  Resp: 20   16  Height:      Weight:  167 lb 8 oz (75.978 kg)    SpO2: 100%   100%    PE  abd soft, wound clean, ostomy functional  Alert and oriented.  CBC Latest Ref Rng 06/22/2015 06/21/2015 06/20/2015  WBC 3.8 - 10.6 K/uL 20.4(H) 24.1(H) 26.5(H)  Hemoglobin 13.0 - 18.0 g/dL 8.9(L) 8.9(L) 9.6(L)  Hematocrit 40.0 - 52.0 % 26.4(L) 27.0(L) 28.2(L)  Platelets 150 - 440 K/uL 353 368 417    BMP Latest Ref Rng 06/22/2015 06/21/2015 06/20/2015  Glucose 65 - 99 mg/dL 112(H) 89 113(H)  BUN 6 - 20 mg/dL 61(H) 66(H) 69(H)  Creatinine 0.61 - 1.24 mg/dL 1.48(H) 1.55(H) 1.67(H)  Sodium 135 - 145 mmol/L 138 135 134(L)  Potassium 3.5 - 5.1 mmol/L 3.8 3.8 3.5  Chloride 101 - 111 mmol/L 108 105 102  CO2 22 - 32 mmol/L 25 25 26   Calcium 8.9 - 10.3 mg/dL 7.9(L) 8.0(L) 8.4(L)    IMP  Improved WBC,   Plan dispo planning, family wants inpt rehab, SW on case, cont cipro/flagyl.  Fleets enam  norco po pain meds written for.

## 2015-06-22 NOTE — Progress Notes (Signed)
[facility]  ROUNDING NOTE   Subjective:   Wife at bedside.  Working with PT Sodium and creatinine improved.   Objective:  Vital signs in last 24 hours:  Temp:  [97.6 F (36.4 C)-98 F (36.7 C)] 97.6 F (36.4 C) (09/09 0656) Pulse Rate:  [69-97] 71 (09/09 0656) Resp:  [16-20] 16 (09/09 0656) BP: (122-139)/(59-69) 129/63 mmHg (09/09 0656) SpO2:  [96 %-100 %] 100 % (09/09 0656) Weight:  [75.978 kg (167 lb 8 oz)] 75.978 kg (167 lb 8 oz) (09/09 0500)  Weight change: 2.495 kg (5 lb 8 oz) Filed Weights   06/20/15 0358 06/21/15 0700 06/22/15 0500  Weight: 73.664 kg (162 lb 6.4 oz) 73.483 kg (162 lb) 75.978 kg (167 lb 8 oz)    Intake/Output: I/O last 3 completed shifts: In: 2000 [P.O.:480; I.V.:1120; IV Piggyback:400] Out: 1300 [Urine:800; Stool:500]   Intake/Output this shift:     Physical Exam: General: NAD, elderly, fragile  Head: Normocephalic, atraumatic. Dry oral mucosal membranes  Eyes: Anicteric, PERRL  Neck: Supple, trachea midline  Lungs:  Clear to auscultation  Heart: Regular rate and rhythm  Abdomen:  +ostomy, midline incision  Extremities: no peripheral edema.  Neurologic: Nonfocal, moving all four extremities  Skin: No lesions  GU +foley    Basic Metabolic Panel:  Recent Labs Lab 06/16/15 0450  06/17/15 0625 06/18/15 0740 06/19/15 0438 06/20/15 0429 06/21/15 0745 06/22/15 0429  NA 128*  < > 128* 130* 128* 134* 135 138  K 3.3*  < > 3.5 3.3* 3.5  3.6 3.5 3.8 3.8  CL 97*  < > 95* 97* 96* 102 105 108  CO2 26  < > 26 26 25 26 25 25   GLUCOSE 157*  < > 230* 201* 210* 113* 89 112*  BUN 44*  < > 55* 64* 72* 69* 66* 61*  CREATININE 1.32*  < > 1.46* 1.74* 1.89* 1.67* 1.55* 1.48*  CALCIUM 8.4*  < > 8.8* 8.6* 8.4* 8.4* 8.0* 7.9*  MG 1.6*  --  2.1 2.1 2.0 2.0  --   --   PHOS 2.5  --  2.8 3.6 4.1 3.1  --   --   < > = values in this interval not displayed.  Liver Function Tests:  Recent Labs Lab 06/22/15 0429  AST 29  ALT 56  ALKPHOS  80  BILITOT 1.3*  PROT 4.0*  ALBUMIN 1.9*   No results for input(s): LIPASE, AMYLASE in the last 168 hours. No results for input(s): AMMONIA in the last 168 hours.  CBC:  Recent Labs Lab 06/16/15 0450 06/17/15 0625 06/18/15 0740 06/19/15 0438 06/20/15 0429 06/21/15 0745 06/22/15 0429  WBC 29.9* 25.6* 25.5* 28.3* 26.5* 24.1* 20.4*  NEUTROABS 26.2* 24.0* 23.2* 26.4* 23.0*  --   --   HGB 9.5* 9.3* 9.1* 9.6* 9.6* 8.9* 8.9*  HCT 27.9* 27.5* 27.0* 27.9* 28.2* 27.0* 26.4*  MCV 92.0 91.8 92.4 93.5 92.7 92.8 93.6  PLT 315 384 440 442* 417 368 353    Cardiac Enzymes: No results for input(s): CKTOTAL, CKMB, CKMBINDEX, TROPONINI in the last 168 hours.  BNP: Invalid input(s): POCBNP  CBG:  Recent Labs Lab 06/21/15 0746 06/21/15 1114 06/21/15 1609 06/21/15 2244 06/22/15 0745  GLUCAP 94 110* 177* 140* 117*    Microbiology: Results for orders placed or performed during the hospital encounter of 06/02/15  MRSA PCR Screening     Status: None   Collection Time: 06/08/15 11:36 PM  Result Value Ref Range Status   MRSA  by PCR NEGATIVE NEGATIVE Final    Comment:        The GeneXpert MRSA Assay (FDA approved for NASAL specimens only), is one component of a comprehensive MRSA colonization surveillance program. It is not intended to diagnose MRSA infection nor to guide or monitor treatment for MRSA infections.   Urine culture     Status: None   Collection Time: 06/15/15  3:45 PM  Result Value Ref Range Status   Specimen Description URINE, CLEAN CATCH  Final   Special Requests zosyn Normal  Final   Culture NO GROWTH 2 DAYS  Final   Report Status 06/17/2015 FINAL  Final  Culture, blood (routine x 2)     Status: None   Collection Time: 06/15/15  3:54 PM  Result Value Ref Range Status   Specimen Description BLOOD LEFT ASSIST CONTROL  Final   Special Requests BOTTLES DRAWN AEROBIC AND ANAEROBIC  6CC  Final   Culture NO GROWTH 5 DAYS  Final   Report Status 06/20/2015 FINAL   Final  Culture, blood (routine x 2)     Status: None   Collection Time: 06/15/15  4:00 PM  Result Value Ref Range Status   Specimen Description BLOOD RIGHT ASSIST CONTROL  Final   Special Requests   Final    BOTTLES DRAWN AEROBIC AND ANAEROBIC  AER 113 CC ANA 11CC   Culture NO GROWTH 5 DAYS  Final   Report Status 06/20/2015 FINAL  Final    Coagulation Studies: No results for input(s): LABPROT, INR in the last 72 hours.  Urinalysis: No results for input(s): COLORURINE, LABSPEC, PHURINE, GLUCOSEU, HGBUR, BILIRUBINUR, KETONESUR, PROTEINUR, UROBILINOGEN, NITRITE, LEUKOCYTESUR in the last 72 hours.  Invalid input(s): APPERANCEUR    Imaging: No results found.   Medications:   . sodium chloride 30 mL/hr at 06/21/15 0645   . apixaban  5 mg Oral BID  . brimonidine  1 drop Both Eyes BID  . ciprofloxacin  400 mg Intravenous Q12H  . dorzolamide  1 drop Both Eyes BID  . feeding supplement (ENSURE ENLIVE)  237 mL Oral BID AC  . hydrALAZINE  25 mg Oral 3 times per day  . insulin aspart  0-15 Units Subcutaneous TID WC  . insulin aspart  0-5 Units Subcutaneous QHS  . latanoprost  1 drop Both Eyes QHS  . methylPREDNISolone (SOLU-MEDROL) injection  60 mg Intravenous Q24H  . metoprolol tartrate  25 mg Oral BID  . metronidazole  500 mg Intravenous Q8H  . sodium chloride  1 g Oral TID WC  . timolol  1 drop Both Eyes BID   [DISCONTINUED] acetaminophen **OR** acetaminophen, alum & mag hydroxide-simeth, artificial tears, calcium carbonate, hydrALAZINE, HYDROmorphone (DILAUDID) injection, ipratropium-albuterol, LORazepam, menthol-cetylpyridinium, metoprolol, ondansetron **OR** ondansetron (ZOFRAN) IV, sodium chloride  Assessment/ Plan:  [name] is a 81 y.o. white male with hypertension, hyperlipidemia, peripheral neurpathy and glaucoma, was admitted to [facility] on 06/02/2015 with perforated diverticulits.   1. Acute renal failure on chronic kidney disease stage III with baseline  creatinine of 1.7, eGFR of 38. CKD secondary to hypertension. CT without renal lesions - Continue to monitor urine output, volume status and renal function. Nonoliguric output.  - on losartan as outpatient. Holding currently - discontinue IV fluids  2. Hyponatremia and hypokalemia : improved Na, potassium   3. Hypertension: holding losartan and hydrochlorothiazide. Well controlled.  - hydralazine, metoprolol and furosemide.    4. Anemia of CKD/blood loss: hemoglobin 8.9 - low threshold to start epo.  Will monitor labs over the weekend. Please call with questions.     LOS: Muniz, Narka 9/9/201610:20 AM

## 2015-06-22 NOTE — Progress Notes (Signed)
Nutrition Follow-up       INTERVENTION:  Meals and snacks: Cater to pt preferences Nutrition Supplement: Continue ensure BID for added nutrition Nutrition diet education: RD provided "Colostomy Nutrition Therapy" handout from the [employer] of [employer]. Teach back method used.  Expect good compliance.     NUTRITION DIAGNOSIS:   Inadequate oral intake related to acute illness, altered GI function as evidenced by  (NPO/CL since admission), improving as tolerating solid foods    GOAL:   Patient will meet greater than or equal to 90% of their needs  improving  MONITOR:    (Energy Intake, Digestive System, Electrolyte/Renal Profile, Anthropometrics)  REASON FOR ASSESSMENT:   Consult New TPN/TNA  ASSESSMENT:       Current Nutrition: at 75% of lunch yesterday, 100% of supper last night and most of breakfast this am   Gastrointestinal Profile: some abdominal cramping during visit Last BM: ostomy with output   Medications: reviewed  Electrolyte/Renal Profile and Glucose Profile:   Recent Labs Lab 06/18/15 0740 06/19/15 0438 06/20/15 0429 06/21/15 0745 06/22/15 0429  NA 130* 128* 134* 135 138  K 3.3* 3.5  3.6 3.5 3.8 3.8  CL 97* 96* 102 105 108  CO2 26 25 26 25 25   BUN 64* 72* 69* 66* 61*  CREATININE 1.74* 1.89* 1.67* 1.55* 1.48*  CALCIUM 8.6* 8.4* 8.4* 8.0* 7.9*  MG 2.1 2.0 2.0  --   --   PHOS 3.6 4.1 3.1  --   --   GLUCOSE 201* 210* 113* 89 112*   Protein Profile:  Recent Labs Lab 06/22/15 0429  ALBUMIN 1.9*     Weight Trend since Admission: Filed Weights   06/20/15 0358 06/21/15 0700 06/22/15 0500  Weight: 162 lb 6.4 oz (73.664 kg) 162 lb (73.483 kg) 167 lb 8 oz (75.978 kg)      Diet Order:  DIET SOFT Room service appropriate?: Yes; Fluid consistency:: Thin  Skin:  Reviewed, no issues   Height:   Ht Readings from Last 1 Encounters:  06/03/15 5\' 5"  (1.651 m)    Weight:   Wt Readings from Last 1 Encounters:   06/22/15 167 lb 8 oz (75.978 kg)     BMI:  Body mass index is 27.87 kg/(m^2).  Estimated Nutritional Needs:   Kcal:  1709-2019 kcals (1195, 1.3 AF, 1.1-1.3 IF)   Protein:  67-85 g (1.1-1.4 g/kg)   Fluid:  1525-1830 mL (25-30 ml/kg)   EDUCATION NEEDS:   No education needs identified at this time  LOW Care Level  Iasiah Ozment B. Zenia Resides, [place], [place] (pager)

## 2015-06-22 NOTE — Progress Notes (Signed)
[facility] [place] NAME: Jesse Macias    MR#:  [id number]  DATE OF BIRTH:  [birth year]  SUBJECTIVE:  Sodium 138, leukocytosis continues to improve, remains afebrile.  Wife at bedside.  Tolerating soft diet. havig some spasms in lower abd area - since foley out REVIEW OF SYSTEMS:    Review of Systems  Constitutional: Negative for fever, chills and malaise/fatigue.  HENT: Negative for sore throat.   Eyes: Negative for blurred vision.  Respiratory: Negative for cough, hemoptysis, shortness of breath and wheezing.   Cardiovascular: Negative for chest pain, palpitations and leg swelling.  Gastrointestinal: Negative for nausea, vomiting, abdominal pain, diarrhea and blood in stool.  Genitourinary: Negative for dysuria.  Musculoskeletal: Negative for back pain.  Neurological: Negative for dizziness, tremors and headaches.  Endo/Heme/Allergies: Does not bruise/bleed easily.    Tolerating Diet: On soft diet DRUG ALLERGIES:  No Known Allergies VITALS:  Blood pressure 129/63, pulse 71, temperature 97.6 F (36.4 C), temperature source Oral, resp. rate 16, height 5\' 5"  (1.651 m), weight 167 lb 8 oz (75.978 kg), SpO2 100 %. PHYSICAL EXAMINATION:   Physical Exam  Constitutional: He is well-developed, well-nourished, and in no distress. No distress.  HENT:  Head: Normocephalic.  Eyes: No scleral icterus.  Neck: Normal range of motion. Neck supple. No JVD present. No tracheal deviation present.  Cardiovascular: Normal rate, regular rhythm and normal heart sounds.  Exam reveals no gallop and no friction rub.   No murmur heard. Pulmonary/Chest: No accessory muscle usage. No tachypnea. No respiratory distress. He has no decreased breath sounds. He has no wheezes. He has no rales. He exhibits no tenderness.  Abdominal: Soft. He exhibits no distension and no mass. There is no tenderness. There is no rebound and no guarding.  Penrose drain   Musculoskeletal: Normal range of motion. He exhibits no edema.  Skin: Skin is warm. No rash noted. No erythema.  Psychiatric: Affect and judgment normal.   LABORATORY PANEL:   CBC  Recent Labs Lab 06/22/15 0429  WBC 20.4*  HGB 8.9*  HCT 26.4*  PLT 353   ------------------------------------------------------------------------------------------------------------------  Chemistries   Recent Labs Lab 06/20/15 0429  06/22/15 0429  NA 134*  < > 138  K 3.5  < > 3.8  CL 102  < > 108  CO2 26  < > 25  GLUCOSE 113*  < > 112*  BUN 69*  < > 61*  CREATININE 1.67*  < > 1.48*  CALCIUM 8.4*  < > 7.9*  MG 2.0  --   --   AST  --   --  29  ALT  --   --  56  ALKPHOS  --   --  80  BILITOT  --   --  1.3*  < > = values in this interval not displayed. ASSESSMENT AND PLAN:  81 year old male with recurrent diverticulitis status post Hartmann procedure.  1. Rapid a.fib: now Heart rate is better controlled. Continue Eliquis.  2. Uncontrolled hypertension: Continue metoprolol, and hydralazine.  3. Hyponatremia: Resolved. Na 138.   4. Acute respiratory failure: resolved.  5. Perforated diverticulitis: Patient is status post Hartman's procedure. As per surgery.    6. leukocytosis: Appreciate infectious disease input, patient is afebrile. Stop steroids.  7. Acute renal failure: Nephrology consultation appreciated. 1.2->1.46->1.74->1.89->1.67->1.55->1.48  8. Glaucoma: Continue atenolol and XALATAN eyedrops   Management plans discussed with the patient, Dr. byrd, family and they are in agreement.  CODE STATUS:  FULL  TOTAL TIME TAKING CARE OF THIS PATIENT: 15 minutes.    D/C plans per primary team.   We will sign off, Primary team is planning for discharge this patient today. Patient is medically clear for discharge.  Case discussed with Dr. Felton Clinton who is in agreement.  Please finish off course of Cipro and Flagyl for total 10 days   [facility], Danali Marinos M.D on 06/22/2015 at 1:23 PM  Between  7am to 6pm - Pager - [phone] After 6pm go to [email or web address] - password [email or web address] [facility]  [facility]  Office  [phone]  CC:  Primary care physician; No primary care provider on file.

## 2015-06-22 NOTE — Consult Note (Signed)
WOC ostomy follow up Stoma type/location: LLQ Colostomy Education provided: Wife at bedside.  Pouch was changed overnight as it was pulled loose during care and soiled pouching system.  Wife able to demonstrate pouch emptying.  We cut a pouch together today and discussed pouch application. Discharge plan is skilled rehab and wife informed that education would continue in that setting.  Enrolled patient in [place] Start Discharge program: Yes [facility] team will continue to follow.  Discharge anticipated for Monday.  Will plan to get one last pouch change and teaching session in.   Domenic Moras RN BSN [facility] Pager [phone]

## 2015-06-22 NOTE — Progress Notes (Signed)
MEDICATION RELATED CONSULT NOTE - FOLLOW UP   Pharmacy Consult for Apixaban (Eliquis) Indication: Apixaban for Afib  No Known Allergies  Patient Measurements: Height: 5\' 5"  (165.1 cm) Weight: 167 lb 8 oz (75.978 kg) IBW/kg (Calculated) : 61.5   Vital Signs: Temp: 97.6 F (36.4 C) (09/09 0656) Temp Source: Oral (09/09 0656) BP: 129/63 mmHg (09/09 0656) Pulse Rate: 71 (09/09 0656)  Labs:  Recent Labs  06/20/15 0429 06/21/15 0745 06/22/15 0429  WBC 26.5* 24.1* 20.4*  HGB 9.6* 8.9* 8.9*  HCT 28.2* 27.0* 26.4*  PLT 417 368 353  CREATININE 1.67* 1.55* 1.48*  MG 2.0  --   --   PHOS 3.1  --   --   ALBUMIN  --   --  1.9*  PROT  --   --  4.0*  AST  --   --  29  ALT  --   --  56  ALKPHOS  --   --  80  BILITOT  --   --  1.3*   Estimated Creatinine Clearance: 32.2 mL/min (by C-G formula based on Cr of 1.48).   Microbiology: Recent Results (from the past 720 hour(s))  MRSA PCR Screening     Status: None   Collection Time: 06/08/15 11:36 PM  Result Value Ref Range Status   MRSA by PCR NEGATIVE NEGATIVE Final    Comment:        The GeneXpert MRSA Assay (FDA approved for NASAL specimens only), is one component of a comprehensive MRSA colonization surveillance program. It is not intended to diagnose MRSA infection nor to guide or monitor treatment for MRSA infections.   Urine culture     Status: None   Collection Time: 06/15/15  3:45 PM  Result Value Ref Range Status   Specimen Description URINE, CLEAN CATCH  Final   Special Requests zosyn Normal  Final   Culture NO GROWTH 2 DAYS  Final   Report Status 06/17/2015 FINAL  Final  Culture, blood (routine x 2)     Status: None   Collection Time: 06/15/15  3:54 PM  Result Value Ref Range Status   Specimen Description BLOOD LEFT ASSIST CONTROL  Final   Special Requests BOTTLES DRAWN AEROBIC AND ANAEROBIC  6CC  Final   Culture NO GROWTH 5 DAYS  Final   Report Status 06/20/2015 FINAL  Final  Culture, blood (routine x  2)     Status: None   Collection Time: 06/15/15  4:00 PM  Result Value Ref Range Status   Specimen Description BLOOD RIGHT ASSIST CONTROL  Final   Special Requests   Final    BOTTLES DRAWN AEROBIC AND ANAEROBIC  AER 113 CC ANA 11CC   Culture NO GROWTH 5 DAYS  Final   Report Status 06/20/2015 FINAL  Final    Medications:  Scheduled:  . apixaban  5 mg Oral BID  . brimonidine  1 drop Both Eyes BID  . ciprofloxacin  400 mg Intravenous Q12H  . dorzolamide  1 drop Both Eyes BID  . feeding supplement (ENSURE ENLIVE)  237 mL Oral BID AC  . hydrALAZINE  25 mg Oral 3 times per day  . hypromellose   Both Eyes QHS  . insulin aspart  0-15 Units Subcutaneous TID WC  . insulin aspart  0-5 Units Subcutaneous QHS  . latanoprost  1 drop Both Eyes QHS  . methylPREDNISolone (SOLU-MEDROL) injection  60 mg Intravenous Q24H  . metoprolol tartrate  25 mg Oral BID  . metronidazole  500 mg Intravenous Q8H  . sodium chloride  1 g Oral TID WC  . timolol  1 drop Both Eyes BID   PRN: [DISCONTINUED] acetaminophen **OR** acetaminophen, alum & mag hydroxide-simeth, calcium carbonate, hydrALAZINE, HYDROmorphone (DILAUDID) injection, ipratropium-albuterol, LORazepam, menthol-cetylpyridinium, metoprolol, ondansetron **OR** ondansetron (ZOFRAN) IV, sodium chloride  Assessment: Apixaban: Patient previously on Apixaban 2.5 mg po BID chronically for anticoagulation due to atrial fibrillation. Medication was withheld upon admission due to surgery.  Per Surgery note today , 06/12/15, OK to begin anticoagulation  Plan:  Current orders for apixaban 2.5 mg po bid. Patient's SCr today is 1.48. Will adjust apixaban to 5 mg PO BID based on SCr <1.5 with TBW > 60kg and age > 37 yrs  Pharmacy will continue to follow.  Larene Beach, PharmD Clinical Pharmacist   06/22/2015 7:55 AM

## 2015-06-22 NOTE — Clinical Social Work Note (Signed)
CSW spoke at length with patient's wife regarding a decision to be made regarding a facility. Patient's wife is not wanting to make a decision at this time and states she does not want her husband discharged. She is aware that [facility] is not able to offer. CSW will continue to follow. Shela Leff MSW,LCSW [phone]

## 2015-06-23 LAB — URINALYSIS COMPLETE WITH MICROSCOPIC (ARMC ONLY)
BILIRUBIN URINE: NEGATIVE
GLUCOSE, UA: NEGATIVE mg/dL
HGB URINE DIPSTICK: NEGATIVE
KETONES UR: NEGATIVE mg/dL
NITRITE: NEGATIVE
PH: 6 (ref 5.0–8.0)
Protein, ur: NEGATIVE mg/dL
SQUAMOUS EPITHELIAL / LPF: NONE SEEN
Specific Gravity, Urine: 1.018 (ref 1.005–1.030)

## 2015-06-23 LAB — CBC
HCT: 29 % — ABNORMAL LOW (ref 40.0–52.0)
HEMOGLOBIN: 9.4 g/dL — AB (ref 13.0–18.0)
MCH: 30.7 pg (ref 26.0–34.0)
MCHC: 32.6 g/dL (ref 32.0–36.0)
MCV: 94.1 fL (ref 80.0–100.0)
Platelets: 359 10*3/uL (ref 150–440)
RBC: 3.08 MIL/uL — AB (ref 4.40–5.90)
RDW: 15.9 % — ABNORMAL HIGH (ref 11.5–14.5)
WBC: 17.8 10*3/uL — AB (ref 3.8–10.6)

## 2015-06-23 LAB — GLUCOSE, CAPILLARY
GLUCOSE-CAPILLARY: 99 mg/dL (ref 65–99)
GLUCOSE-CAPILLARY: 112 mg/dL — AB (ref 65–99)
Glucose-Capillary: 121 mg/dL — ABNORMAL HIGH (ref 65–99)
Glucose-Capillary: 129 mg/dL — ABNORMAL HIGH (ref 65–99)

## 2015-06-23 LAB — CREATININE, SERUM
Creatinine, Ser: 1.3 mg/dL — ABNORMAL HIGH (ref 0.61–1.24)
GFR, EST AFRICAN AMERICAN: 54 mL/min — AB (ref >60)
GFR, EST NON AFRICAN AMERICAN: 47 mL/min — AB (ref >60)

## 2015-06-23 MED ORDER — ACETAMINOPHEN 325 MG PO TABS
650.0000 mg | ORAL_TABLET | Freq: Four times a day (QID) | ORAL | Status: DC | PRN
  Administered 2015-06-23 (×2): 650 mg via ORAL
  Filled 2015-06-23 (×2): qty 2

## 2015-06-23 MED ORDER — PHENAZOPYRIDINE HCL 100 MG PO TABS
100.0000 mg | ORAL_TABLET | Freq: Three times a day (TID) | ORAL | Status: DC
  Administered 2015-06-23 – 2015-06-26 (×8): 100 mg via ORAL
  Filled 2015-06-23 (×8): qty 1

## 2015-06-23 NOTE — Clinical Social Work Note (Signed)
Patient's wife came up to [place] and stated that the family would like [facility], knowing that they had not yet made a decision. CSW sent a message via [facility] to [facility] asking them for a disposition as their response prior was that they were "considering." Patient's wife states she was given a tour yesterday by "the lady at the front" and was told they could offer a bed. CSW attempted to explain that unfortunately we cannot go by that information and will have to hear it from [facility] directly. Patient does have other bed offers but patient's wife is not wanting to make a decision amongst those. Shela Leff MSW,LCSW [phone]

## 2015-06-23 NOTE — Progress Notes (Signed)
Pt c/o lower abd pain more in the suprapubic area but was unclear when describing pain stating "I don't know it just a discomfort."  Pt has urinary urgency and frequency.  Bladder scan was obtained at 135 ml.  No discomfort noted by pt during lower abd palpation or suprapubic palpation.  Pt denies burning when voiding but does state discomfort when voiding.    MD advised and informed of pt complaints and of bladder scan results. Order for urinalyses was given and obtained.  Per result of urinalysis Pt has trace of leukocytes present.  MD notified of result.    Will continue to monitor.

## 2015-06-23 NOTE — Plan of Care (Signed)
Problem: Phase III Progression Outcomes Goal: Other Phase III Outcomes/Goals Outcome: Not Applicable Date Met:  24/82/50 No additional Phase Outcome/Goals identified at this time.     Problem: Phase II Progression Outcomes Goal: Other Phase II Outcomes/Goals Outcome: Not Applicable Date Met:  03/70/48 No additional Phase Outcome/Goals identified at this time.     Problem: Phase III Progression Outcomes Goal: Other Phase III Outcomes/Goals Outcome: Not Applicable Date Met:  88/91/69 No additional Phase Outcome/Goals identified at this time.

## 2015-06-23 NOTE — Progress Notes (Signed)
Patient ID: Jesse Macias, male   DOB: 1924-05-17, 81 y.o.   MRN: [id number]   Surgery  POD 13  S/P sigmoid colectomy with end colostomy.  He is continuing to have suprapubic pressure and difficulty urination with dysuria. A post void bladder scan was 145 cc. It should be noted that the patient had several Foley catheters since surgery. The patient denies any nausea and vomiting. There is been no wound drainage. His ostomy continues to function is tolerating a regular diet.  Filed Vitals:   06/23/15 0139 06/23/15 0454 06/23/15 0632 06/23/15 0734  BP: 178/99  125/56 136/61  Pulse: 74  68 69  Temp: 97.8 F (36.6 C)   98.3 F (36.8 C)  TempSrc: Oral   Oral  Resp:    18  Height:      Weight:  165 lb 8 oz (75.07 kg)    SpO2: 100%   99%    PE: The patient has significant lower extremity and dependent edema. He is alert and oriented.  His abdomen is soft with minimal tenderness over the suprapubic region and a dressing is dry. Ostomy is functional.  Labs  CBC Latest Ref Rng 06/23/2015 06/22/2015 06/21/2015  WBC 3.8 - 10.6 K/uL 17.8(H) 20.4(H) 24.1(H)  Hemoglobin 13.0 - 18.0 g/dL 9.4(L) 8.9(L) 8.9(L)  Hematocrit 40.0 - 52.0 % 29.0(L) 26.4(L) 27.0(L)  Platelets 150 - 440 K/uL 359 353 368   CMP Latest Ref Rng 06/23/2015 06/22/2015 06/21/2015  Glucose 65 - 99 mg/dL - 112(H) 89  BUN 6 - 20 mg/dL - 61(H) 66(H)  Creatinine 0.61 - 1.24 mg/dL 1.30(H) 1.48(H) 1.55(H)  Sodium 135 - 145 mmol/L - 138 135  Potassium 3.5 - 5.1 mmol/L - 3.8 3.8  Chloride 101 - 111 mmol/L - 108 105  CO2 22 - 32 mmol/L - 25 25  Calcium 8.9 - 10.3 mg/dL - 7.9(L) 8.0(L)  Total Protein 6.5 - 8.1 g/dL - 4.0(L) -  Total Bilirubin 0.3 - 1.2 mg/dL - 1.3(H) -  Alkaline Phos 38 - 126 U/L - 80 -  AST 15 - 41 U/L - 29 -  ALT 17 - 63 U/L - 56 -     IMP/PLAN:   The patient's white count continues to improve following the initiation of intravenous Cipro and Flagyl in the care of Dr. Ola Spurr of infectious disease. I suspect  that he does not have an evolving abscess within his pelvis. He very well may have a urinary tract infection. We will go ahead and get a urinalysis. If urinalysis is negative and the patient will require repeat CT scan to look for pelvic abscess or abscesses why he is having urinary discomfort.  The plan was discussed with him and his wife and both are in agreement.

## 2015-06-24 ENCOUNTER — Encounter: Payer: Self-pay | Admitting: Radiology

## 2015-06-24 ENCOUNTER — Inpatient Hospital Stay: Payer: Medicare Other

## 2015-06-24 LAB — GLUCOSE, CAPILLARY
Glucose-Capillary: 97 mg/dL (ref 65–99)
Glucose-Capillary: 142 mg/dL — ABNORMAL HIGH (ref 65–99)
Glucose-Capillary: 99 mg/dL (ref 65–99)
Glucose-Capillary: 102 mg/dL — ABNORMAL HIGH (ref 65–99)

## 2015-06-24 LAB — CBC
HEMATOCRIT: 27.7 % — AB (ref 40.0–52.0)
Hemoglobin: 9 g/dL — ABNORMAL LOW (ref 13.0–18.0)
MCH: 31 pg (ref 26.0–34.0)
MCHC: 32.5 g/dL (ref 32.0–36.0)
MCV: 95.5 fL (ref 80.0–100.0)
Platelets: 310 10*3/uL (ref 150–440)
RBC: 2.9 MIL/uL — ABNORMAL LOW (ref 4.40–5.90)
RDW: 16.3 % — AB (ref 11.5–14.5)
WBC: 14.4 10*3/uL — ABNORMAL HIGH (ref 3.8–10.6)

## 2015-06-24 MED ORDER — SODIUM CHLORIDE 0.9 % IV SOLN
INTRAVENOUS | Status: DC
  Administered 2015-06-24 – 2015-06-25 (×2): via INTRAVENOUS

## 2015-06-24 MED ORDER — IOHEXOL 300 MG/ML  SOLN
75.0000 mL | Freq: Once | INTRAMUSCULAR | Status: AC | PRN
  Administered 2015-06-24: 75 mL via INTRAVENOUS

## 2015-06-24 MED ORDER — SODIUM CHLORIDE 0.9 % IV BOLUS (SEPSIS)
1000.0000 mL | Freq: Once | INTRAVENOUS | Status: AC
  Administered 2015-06-24: 1000 mL via INTRAVENOUS

## 2015-06-24 MED ORDER — IOHEXOL 240 MG/ML SOLN
25.0000 mL | INTRAMUSCULAR | Status: AC
  Administered 2015-06-24 (×2): 25 mL via ORAL

## 2015-06-24 NOTE — Progress Notes (Signed)
Patient ID: Jesse Macias, male   DOB: 11/08/1923, 81 y.o.   MRN: [id number]   [facility]   PATIENT NAME: Jesse Macias    MR#:  [id number]  DATE OF BIRTH:  Sep 30, 1924  SUBJECTIVE:   Postoperative day number 14  He continues to have pain and suprapubic in nature as well as some lower back pain. Urinalysis was negative. Postvoid residual on bladder scan was reported to me is approximately 100 cc. The patient denies any nausea or vomiting. His ostomy continues to function and he is tolerating a regular diet. Wife again concerned that he is not getting his physical therapy.  REVIEW OF SYSTEMS:   Review of Systems  Constitutional: Negative for fever and chills.  Gastrointestinal: Positive for abdominal pain. Negative for heartburn and vomiting.    DRUG ALLERGIES:  No Known Allergies  VITALS:  Blood pressure 141/67, pulse 66, temperature 97.8 F (36.6 C), temperature source Oral, resp. rate 16, height 5\' 5"  (1.651 m), weight 163 lb 12.8 oz (74.299 kg), SpO2 99 %.  I/O last 3 completed shifts: In: 1740.4 [P.O.:490; I.V.:1250.4] Out: 475 [Urine:150; Stool:325] Total I/O In: 360 [P.O.:360] Out: -    PHYSICAL EXAMINATION:  Physical Exam  Constitutional: He is oriented to person, place, and time and well-developed, well-nourished, and in no distress. No distress.  HENT:  Head: Normocephalic and atraumatic.  Eyes: Conjunctivae are normal. Pupils are equal, round, and reactive to light.  Cardiovascular: Normal rate and regular rhythm.   Pulmonary/Chest: Effort normal and breath sounds normal.  Abdominal: He exhibits no distension and no mass. There is tenderness. There is no rebound and no guarding.  Neurological: He is oriented to person, place, and time.  Skin: He is not diaphoretic.    CBC Latest Ref Rng 06/24/2015 06/23/2015 06/22/2015  WBC 3.8 - 10.6 K/uL 14.4(H) 17.8(H) 20.4(H)  Hemoglobin 13.0 - 18.0 g/dL 9.0(L) 9.4(L) 8.9(L)  Hematocrit 40.0 - 52.0 % 27.7(L)  29.0(L) 26.4(L)  Platelets 150 - 440 K/uL 310 359 353    BMP Latest Ref Rng 06/23/2015 06/22/2015 06/21/2015  Glucose 65 - 99 mg/dL - 112(H) 89  BUN 6 - 20 mg/dL - 61(H) 66(H)  Creatinine 0.61 - 1.24 mg/dL 1.30(H) 1.48(H) 1.55(H)  Sodium 135 - 145 mmol/L - 138 135  Potassium 3.5 - 5.1 mmol/L - 3.8 3.8  Chloride 101 - 111 mmol/L - 108 105  CO2 22 - 32 mmol/L - 25 25  Calcium 8.9 - 10.3 mg/dL - 7.9(L) 8.0(L)      ASSESSMENT AND PLAN:   The patient continues to complain of suprapubic abdominal pain of unclear etiology. Genitourinary etiology appears to not be the source. Despite his white count being improved his pain continues. I believe a repeat CT scan to follow up on the pelvic fluid collection is warranted.  Plan is for CT scan of the abdomen and pelvis with IV contrast. We will give intravenous fluids pre-and post-IV contrast.

## 2015-06-25 LAB — RENAL FUNCTION PANEL
ALBUMIN: 1.9 g/dL — AB (ref 3.5–5.0)
Anion gap: 5 (ref 5–15)
BUN: 60 mg/dL — AB (ref 6–20)
CALCIUM: 7.6 mg/dL — AB (ref 8.9–10.3)
CO2: 21 mmol/L — ABNORMAL LOW (ref 22–32)
Chloride: 111 mmol/L (ref 101–111)
Creatinine, Ser: 2.42 mg/dL — ABNORMAL HIGH (ref 0.61–1.24)
GFR calc Af Amer: 26 mL/min — ABNORMAL LOW (ref >60)
GFR calc non Af Amer: 22 mL/min — ABNORMAL LOW (ref >60)
GLUCOSE: 85 mg/dL (ref 65–99)
PHOSPHORUS: 4.1 mg/dL (ref 2.5–4.6)
POTASSIUM: 3.7 mmol/L (ref 3.5–5.1)
SODIUM: 137 mmol/L (ref 135–145)

## 2015-06-25 LAB — CBC
HEMATOCRIT: 26 % — AB (ref 40.0–52.0)
Hemoglobin: 8.6 g/dL — ABNORMAL LOW (ref 13.0–18.0)
MCH: 31.7 pg (ref 26.0–34.0)
MCHC: 33.2 g/dL (ref 32.0–36.0)
MCV: 95.4 fL (ref 80.0–100.0)
Platelets: 256 10*3/uL (ref 150–440)
RBC: 2.72 MIL/uL — ABNORMAL LOW (ref 4.40–5.90)
RDW: 16.2 % — AB (ref 11.5–14.5)
WBC: 12.9 10*3/uL — ABNORMAL HIGH (ref 3.8–10.6)

## 2015-06-25 LAB — GLUCOSE, CAPILLARY
GLUCOSE-CAPILLARY: 100 mg/dL — AB (ref 65–99)
Glucose-Capillary: 78 mg/dL (ref 65–99)
Glucose-Capillary: 119 mg/dL — ABNORMAL HIGH (ref 65–99)

## 2015-06-25 MED ORDER — TAMSULOSIN HCL 0.4 MG PO CAPS
0.4000 mg | ORAL_CAPSULE | Freq: Every day | ORAL | Status: DC
  Administered 2015-06-25 – 2015-06-26 (×2): 0.4 mg via ORAL
  Filled 2015-06-25 (×2): qty 1

## 2015-06-25 NOTE — Consult Note (Signed)
WOC ostomy follow up Stoma type/location: LLQ Colostomy.  Pouch change/teaching with wife at bedside today.  Stomal assessment/size: 2 " edematous, although improving, pink and patient.    Peristomal assessment: Mucocutaneous separation noted from 9- 11 o'clock,, stoma powder and barrier ring added to peristomal skin for added protection Treatment options for stomal/peristomal skin: Stoma powder and barrier ring Output Soft brown stool Ostomy pouching:2pc. 2 1/4" pouch  Stoma powder from 9-11 o'clock and barrier ring to peristomal skin.  Wife at bedside and discussed stomal edema and rationale for powder and barrier ring.    Education provided: Wife cut barrier opening and applied barrier.  Practiced pouch application today.   Enrolled patient in [facility] Start Discharge program: Yes [place] team will continue to follow.  Domenic Moras RN BSN [place] Pager [phone]

## 2015-06-25 NOTE — Progress Notes (Signed)
Physical Therapy Treatment Patient Details Name: Serapio Gelinas MRN: [id number] DOB: [birth year] Today's Date: 06/25/2015    History of Present Illness presented to ER with acute abdominal/LLQ pain; admitted with diverticulitis with microperforation.  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 Post-op course complicated by transfer to CCU due to afib with RVR (requiring amioderone drip). Pt with increased B LE swelling this date.    PT Comments    Pt is making good progress towards goals. Pt with depressed affect and questions why he hasn't passed away yet because this life isn't making him happy. Wife initially in room, however left halfway during therapy. Pt actually performed well with therapy improving ambulation distance with encouragement from therapist and cues for safe ambulation. Pt also progressing with there-ex, increasing reps, however quickly fatigues with activity.   Follow Up Recommendations  SNF     Equipment Recommendations  Rolling walker with 5" wheels    Recommendations for Other Services       Precautions / Restrictions Precautions Precautions: Fall Restrictions Weight Bearing Restrictions: No    Mobility  Bed Mobility               General bed mobility comments: not performed, pt received in recliner  Transfers Overall transfer level: Needs assistance Equipment used: Rolling walker (2 wheeled) Transfers: Sit to/from Stand Sit to Stand: Min assist         General transfer comment: sit<>Stand with rw and min assist. Pt requires cues for pushing from seated surface  Ambulation/Gait Ambulation/Gait assistance: Min assist Ambulation Distance (Feet): 75 Feet Assistive device: Rolling walker (2 wheeled) Gait Pattern/deviations: Step-to pattern Gait velocity: slow Gait velocity interpretation: <1.8 ft/sec, indicative of risk for recurrent falls General Gait  Details: slow step to gait pattern performed. Pt required 1 seated rest break during ambulation secondary to fatigue. Pt requires cues for upright posture.   Stairs            Wheelchair Mobility    Modified Rankin (Stroke Patients Only)       Balance                                    Cognition Arousal/Alertness: Awake/alert Behavior During Therapy: WFL for tasks assessed/performed Overall Cognitive Status: Within Functional Limits for tasks assessed                      Exercises Other Exercises Other Exercises: Seated ther-ex x 12 reps including B ankle pumps, hip add squeezes, hip abd/add, and SAQ. Pt requires rest break between each bout of ther-ex secondary to fatigue.    General Comments        Pertinent Vitals/Pain Pain Assessment: No/denies pain    Home Living                      Prior Function            PT Goals (current goals can now be found in the care plan section) Acute Rehab PT Goals Patient Stated Goal: to do whatever the PT wants PT Goal Formulation: With patient Time For Goal Achievement: 06/27/15 Potential to Achieve Goals: Good Progress towards PT goals: Progressing toward goals    Frequency  Min 2X/week    PT Plan Current plan remains appropriate    Co-evaluation  End of Session Equipment Utilized During Treatment: Gait belt Activity Tolerance: Patient limited by fatigue Patient left: in chair (no chair alarm applied, RN aware, )     Time: 1310-1340 PT Time Calculation (min) (ACUTE ONLY): 30 min  Charges:  $Gait Training: 8-22 mins $Therapeutic Exercise: 8-22 mins                    G Codes:      Emery Binz 2015/07/02, 1:53 PM  Greggory Stallion, PT, DPT [phone]

## 2015-06-25 NOTE — Progress Notes (Signed)
Subjective:   Wife at bedside.  CT with iv contrast was done yesterday S Cr is significantly worse today He reports difficulty with micturition. Enlarged prostate noted on CT UOP not accurate   Objective:  Vital signs in last 24 hours:  Temp:  [97.5 F (36.4 C)-98.3 F (36.8 C)] 98.3 F (36.8 C) (09/12 0730) Pulse Rate:  [65-71] 71 (09/12 0730) Resp:  [14-18] 16 (09/12 0730) BP: (103-145)/(49-80) 123/63 mmHg (09/12 0730) SpO2:  [97 %-99 %] 99 % (09/12 0730) Weight:  [76.114 kg (167 lb 12.8 oz)] 76.114 kg (167 lb 12.8 oz) (09/12 0448)  Weight change: 1.814 kg (4 lb) Filed Weights   06/23/15 0454 06/24/15 0500 06/25/15 0448  Weight: 75.07 kg (165 lb 8 oz) 74.299 kg (163 lb 12.8 oz) 76.114 kg (167 lb 12.8 oz)    Intake/Output: I/O last 3 completed shifts: In: 2688.3 [P.O.:360; I.V.:1028.3; IV Piggyback:1300] Out: 325 [Stool:325]   Intake/Output this shift:     Physical Exam: General: NAD, elderly, fragile, sitting up in chair  Head: Normocephalic, atraumatic  Eyes: Anicteric,   Neck: Supple, trachea midline  Lungs:  Clear to auscultation  Heart: Regular rate and rhythm  Abdomen:  +ostomy, midline incision, Distended, soft  Extremities: ++ peripheral edema. + dependent edema in arms  Neurologic: Nonfocal, moving all four extremities, alert, able to communicate  Skin: No acute lesions  GU      Basic Metabolic Panel:  Recent Labs Lab 06/19/15 0438 06/20/15 0429 06/21/15 0745 06/22/15 0429 06/23/15 0518 06/25/15 0319  NA 128* 134* 135 138  --  137  K 3.5  3.6 3.5 3.8 3.8  --  3.7  CL 96* 102 105 108  --  111  CO2 25 26 25 25   --  21*  GLUCOSE 210* 113* 89 112*  --  85  BUN 72* 69* 66* 61*  --  60*  CREATININE 1.89* 1.67* 1.55* 1.48* 1.30* 2.42*  CALCIUM 8.4* 8.4* 8.0* 7.9*  --  7.6*  MG 2.0 2.0  --   --   --   --   PHOS 4.1 3.1  --   --   --  4.1    Liver Function Tests:  Recent Labs Lab 06/22/15 0429 06/25/15 0319  AST 29  --   ALT 56  --    ALKPHOS 80  --   BILITOT 1.3*  --   PROT 4.0*  --   ALBUMIN 1.9* 1.9*   No results for input(s): LIPASE, AMYLASE in the last 168 hours. No results for input(s): AMMONIA in the last 168 hours.  CBC:  Recent Labs Lab 06/19/15 0438 06/20/15 0429 06/21/15 0745 06/22/15 0429 06/23/15 0518 06/24/15 0518 06/25/15 0319  WBC 28.3* 26.5* 24.1* 20.4* 17.8* 14.4* 12.9*  NEUTROABS 26.4* 23.0*  --   --   --   --   --   HGB 9.6* 9.6* 8.9* 8.9* 9.4* 9.0* 8.6*  HCT 27.9* 28.2* 27.0* 26.4* 29.0* 27.7* 26.0*  MCV 93.5 92.7 92.8 93.6 94.1 95.5 95.4  PLT 442* 417 368 353 359 310 256    Cardiac Enzymes: No results for input(s): CKTOTAL, CKMB, CKMBINDEX, TROPONINI in the last 168 hours.  BNP: Invalid input(s): POCBNP  CBG:  Recent Labs Lab 06/24/15 0726 06/24/15 1146 06/24/15 1649 06/24/15 2110 06/25/15 0731  GLUCAP 97 142* 99 102* 82    Microbiology: Results for orders placed or performed during the hospital encounter of 06/02/15  MRSA PCR Screening     Status: None  Collection Time: 06/08/15 11:36 PM  Result Value Ref Range Status   MRSA by PCR NEGATIVE NEGATIVE Final    Comment:        The GeneXpert MRSA Assay (FDA approved for NASAL specimens only), is one component of a comprehensive MRSA colonization surveillance program. It is not intended to diagnose MRSA infection nor to guide or monitor treatment for MRSA infections.   Urine culture     Status: None   Collection Time: 06/15/15  3:45 PM  Result Value Ref Range Status   Specimen Description URINE, CLEAN CATCH  Final   Special Requests zosyn Normal  Final   Culture NO GROWTH 2 DAYS  Final   Report Status 06/17/2015 FINAL  Final  Culture, blood (routine x 2)     Status: None   Collection Time: 06/15/15  3:54 PM  Result Value Ref Range Status   Specimen Description BLOOD LEFT ASSIST CONTROL  Final   Special Requests BOTTLES DRAWN AEROBIC AND ANAEROBIC  6CC  Final   Culture NO GROWTH 5 DAYS  Final   Report  Status 06/20/2015 FINAL  Final  Culture, blood (routine x 2)     Status: None   Collection Time: 06/15/15  4:00 PM  Result Value Ref Range Status   Specimen Description BLOOD RIGHT ASSIST CONTROL  Final   Special Requests   Final    BOTTLES DRAWN AEROBIC AND ANAEROBIC  AER 113 CC ANA 11CC   Culture NO GROWTH 5 DAYS  Final   Report Status 06/20/2015 FINAL  Final    Coagulation Studies: No results for input(s): LABPROT, INR in the last 72 hours.  Urinalysis:  Recent Labs  06/23/15 0947  COLORURINE YELLOW*  LABSPEC 1.018  PHURINE 6.0  GLUCOSEU NEGATIVE  HGBUR NEGATIVE  BILIRUBINUR NEGATIVE  KETONESUR NEGATIVE  PROTEINUR NEGATIVE  NITRITE NEGATIVE  LEUKOCYTESUR TRACE*      Imaging: Ct Abdomen Pelvis W Contrast  06/24/2015   CLINICAL DATA:  Diverticulitis of the large intestine. Status post sigmoid colectomy with and colostomy. Persistent suprapubic pressure in difficulty with urination. Dysuria.  EXAM: CT ABDOMEN AND PELVIS WITH CONTRAST  TECHNIQUE: Multidetector CT imaging of the abdomen and pelvis was performed using the standard protocol following bolus administration of intravenous contrast.  CONTRAST:  66m OMNIPAQUE IOHEXOL 300 MG/ML  SOLN  COMPARISON:  CT abdomen pelvis 06/16/2015.  FINDINGS: Bilateral pleural effusions are similar to the prior exam. Associated atelectasis is noted.  Heart size is normal. Coronary artery calcifications are again noted.  A simple cyst in the a caudate head of the liver measures 15 mm. The liver is otherwise unremarkable. Spleen is within normal limits. Abdominal ascites have increased with fluid around the free edge of the liver and about the spleen.  The distal esophagus is mildly dilated. The stomach is moderately distended as well. Gastric distention has increased. Inflammatory changes about the third portion of the duodenum have increased. There is diffuse fatty atrophy of the pancreas.  The common bile duct and gallbladder are within normal  limits. The adrenal glands are normal. A 2.8 cm cyst at the lower pole of the right kidney is stable.  Mild bilateral hydronephrosis is new. The ureters are dilated into the anatomic pelvis.  Ascites within the anatomic pelvis has increased. There is fluid along the pericolic gutter bilaterally. No discrete peripherally enhancing abscess is evident. Persistent inflammatory changes are noted at the site of previous perforation. There is focal dilation of the small bowel which represents proximal  jejunum at this level as well. This suggests there is an inflammatory component at this level, near the colostomy site. Oral contrast is well seen into the residual right-sided colon to the colostomy site.  The urinary bladder is moderately dilated. The prostate is enlarged.  Bone windows demonstrate Schmorl's nodes. No focal lytic or blastic lesions are present.  IMPRESSION: 1. Increased abdominal ascites, still most prominent in the left lower quadrant. 2. Focal dilation of small bowel at the site of previous perforation. The duodenum is not and enlarged but is inflamed proximal to this. 3. Contrast is present throughout nondilated small bowel and the residual proximal colon. 4. The area focal bowel inflammation is again seen in the left lower quadrant near the ostomy site. There is no definite free air. 5. New mild to moderate hydronephrosis is present bilaterally. 6. Stable small bilateral pleural effusions and associated atelectasis.   Electronically Signed   By: San Morelle M.D.   On: 06/24/2015 15:38     Medications:     . apixaban  5 mg Oral BID  . brimonidine  1 drop Both Eyes BID  . ciprofloxacin  400 mg Intravenous Q12H  . dorzolamide  1 drop Both Eyes BID  . feeding supplement (ENSURE ENLIVE)  237 mL Oral BID AC  . hydrALAZINE  25 mg Oral 3 times per day  . insulin aspart  0-15 Units Subcutaneous TID WC  . insulin aspart  0-5 Units Subcutaneous QHS  . latanoprost  1 drop Both Eyes QHS  .  metoprolol tartrate  25 mg Oral BID  . metronidazole  500 mg Intravenous Q8H  . phenazopyridine  100 mg Oral TID WC  . sodium chloride  1 g Oral TID WC  . timolol  1 drop Both Eyes BID   [DISCONTINUED] acetaminophen **OR** acetaminophen, acetaminophen, alum & mag hydroxide-simeth, artificial tears, calcium carbonate, hydrALAZINE, HYDROcodone-acetaminophen, HYDROmorphone (DILAUDID) injection, ipratropium-albuterol, LORazepam, menthol-cetylpyridinium, metoprolol, ondansetron **OR** ondansetron (ZOFRAN) IV, sodium chloride  Assessment/ Plan:  Jesse Macias is a 80 y.o. white male with hypertension, hyperlipidemia, peripheral neurpathy and glaucoma, was admitted to [facility] on 06/02/2015 with perforated diverticulits.   1. Acute renal failure on chronic kidney disease stage III with baseline creatinine of 1.7, eGFR of 38. CKD secondary to hypertension. CT without renal lesions - New round of ARF likely secondary to IV CONTRAST exposure 9/10 - Also possibility of urine retention- Will get the nurse to do I/O cath - Continue to monitor urine output, volume status and renal function. Nonoliguric output.  - on losartan as outpatient. Holding currently - discontinue IV fluids - Electrolytes and Volume status are acceptable No acute indication for Dialysis at present   2. Hyponatremia and hypokalemia : improved Na, potassium   3. Hypertension: holding losartan and hydrochlorothiazide. Well controlled.  -  metoprolol  -  avoid diuretics at present   4. Anemia of CKD/blood loss: hemoglobin 8.6 - low threshold to start epo.   5. BPH - start flomax - i/o cath today      LOS: 23 Jesse Macias 9/12/20169:02 AM

## 2015-06-25 NOTE — Progress Notes (Signed)
Jesse Macias is a 81 y.o. male patient POD#15 from [facility] for acute diverticulitis.  Patient doing somewhat better today.  States still having some waves of pain in the suprapubic area but has improved.  His wife is concerned that he does not have much of an appetite. He is eating some and drinking about 1 ensure per day.    1. Perforated diverticulum of large intestine   2. Shortness of breath   3. Encounter for nasogastric (NG) tube placement   4. Ileus   5. Diverticulitis   6. Encounter for central line placement   7. SOB (shortness of breath)   8. Leukocytosis   9. Reflux   10. Acute renal failure   11. Diverticulitis large intestine     Past Medical History  Diagnosis Date  . Hypertension   . History of hiatal hernia   . Diabetes mellitus without complication     Current Facility-Administered Medications  Medication Dose Route Frequency Provider Last Rate Last Dose  . acetaminophen (TYLENOL) suppository 650 mg  650 mg Rectal Q6H PRN Marlyce Huge, MD      . acetaminophen (TYLENOL) tablet 650 mg  650 mg Oral Q6H PRN Sherri Rad, MD   650 mg at 06/23/15 2018  . alum & mag hydroxide-simeth (MAALOX/MYLANTA) 200-200-20 MG/5ML suspension 30 mL  30 mL Oral Q6H PRN Marlyce Huge, MD   30 mL at 06/15/15 1331  . apixaban (ELIQUIS) tablet 5 mg  5 mg Oral BID Max Sane, MD   5 mg at 06/25/15 0952  . artificial tears ophthalmic ointment   Both Eyes QHS PRN Lytle Butte, MD      . brimonidine (ALPHAGAN) 0.2 % ophthalmic solution 1 drop  1 drop Both Eyes BID Marlyce Huge, MD   1 drop at 06/25/15 0954  . calcium carbonate (TUMS - dosed in mg elemental calcium) chewable tablet 200 mg of elemental calcium  1 tablet Oral TID PRN Marlyce Huge, MD   200 mg of elemental calcium at 06/16/15 0500  . ciprofloxacin (CIPRO) IVPB 400 mg  400 mg Intravenous Q12H Vipul Shah, MD   400 mg at 06/24/15 2325  . dorzolamide (TRUSOPT) 2 % ophthalmic solution 1 drop  1 drop Both  Eyes BID Marlyce Huge, MD   1 drop at 06/25/15 0953  . feeding supplement (ENSURE ENLIVE) (ENSURE ENLIVE) liquid 237 mL  237 mL Oral BID AC Sherri Rad, MD   237 mL at 06/23/15 1750  . hydrALAZINE (APRESOLINE) injection 10 mg  10 mg Intravenous Q6H PRN Max Sane, MD   10 mg at 06/12/15 0406  . HYDROcodone-acetaminophen (NORCO/VICODIN) 5-325 MG per tablet 1-2 tablet  1-2 tablet Oral Q6H PRN Sherri Rad, MD   2 tablet at 06/24/15 1037  . HYDROmorphone (DILAUDID) injection 0.5 mg  0.5 mg Intravenous Q4H PRN Marlyce Huge, MD   0.5 mg at 06/24/15 1342  . insulin aspart (novoLOG) injection 0-15 Units  0-15 Units Subcutaneous TID WC Sherri Rad, MD   2 Units at 06/24/15 1327  . insulin aspart (novoLOG) injection 0-5 Units  0-5 Units Subcutaneous QHS Sherri Rad, MD   0 Units at 06/19/15 2155  . ipratropium-albuterol (DUONEB) 0.5-2.5 (3) MG/3ML nebulizer solution 3 mL  3 mL Nebulization Q4H PRN Marlyce Huge, MD      . latanoprost (XALATAN) 0.005 % ophthalmic solution 1 drop  1 drop Both Eyes QHS Marlyce Huge, MD   1 drop at 06/24/15 2028  . LORazepam (ATIVAN) injection 0.5 mg  0.5 mg Intravenous Q6H PRN Nicholes Mango, MD      . menthol-cetylpyridinium (CEPACOL) lozenge 3 mg  1 lozenge Oral PRN Florene Glen, MD      . metoprolol (LOPRESSOR) injection 5 mg  5 mg Intravenous Q4H PRN Nicholes Mango, MD   5 mg at 06/11/15 0540  . metoprolol tartrate (LOPRESSOR) tablet 25 mg  25 mg Oral BID Yolonda Kida, MD   25 mg at 06/25/15 0951  . metroNIDAZOLE (FLAGYL) IVPB 500 mg  500 mg Intravenous Q8H Max Sane, MD   500 mg at 06/25/15 0313  . ondansetron (ZOFRAN-ODT) disintegrating tablet 4 mg  4 mg Oral Q6H PRN Marlyce Huge, MD       Or  . ondansetron [facility]) injection 4 mg  4 mg Intravenous Q6H PRN Marlyce Huge, MD   4 mg at 06/24/15 1342  . phenazopyridine (PYRIDIUM) tablet 100 mg  100 mg Oral TID WC Sherri Rad, MD   100 mg at 06/25/15 0831  . sodium chloride 0.9 %  injection 10 mL  10 mL Intravenous PRN Marlyce Huge, MD   10 mL at 06/14/15 0516  . tamsulosin (FLOMAX) capsule 0.4 mg  0.4 mg Oral QPC supper Harmeet Singh, MD      . timolol (TIMOPTIC) 0.25 % ophthalmic solution 1 drop  1 drop Both Eyes BID Marlyce Huge, MD   1 drop at 06/25/15 0953   No Known Allergies Active Problems:   Acute diverticulitis   Perforated diverticulum of large intestine   Ileus  Blood pressure 123/63, pulse 71, temperature 98.3 F (36.8 C), temperature source Oral, resp. rate 16, height 5\' 5"  (1.651 m), weight 167 lb 12.8 oz (76.114 kg), SpO2 99 %.  Review of Systems  Constitutional: Positive for malaise/fatigue. Negative for fever and chills.  Gastrointestinal: Positive for abdominal pain. Negative for nausea, vomiting and blood in stool.  Genitourinary: Positive for dysuria and flank pain.  Neurological: Positive for weakness.  All other systems reviewed and are negative.   Physical Exam  Constitutional: He is oriented to person, place, and time. He appears well-developed and well-nourished. No distress.  Cardiovascular: Normal rate and regular rhythm.   Pulmonary/Chest: Effort normal and breath sounds normal.  Abdominal: Soft. He exhibits no distension.  Incision site c/d/i, mild tenderness over suprapubic area, no cva tenderness today, ostomy pink and patent with gas and stool in bag  Musculoskeletal: Normal range of motion. He exhibits no edema.  Neurological: He is alert and oriented to person, place, and time.  Skin: Skin is warm and dry. No rash noted. No erythema.  Vitals reviewed.   Labs:  CBC Latest Ref Rng 06/25/2015 06/24/2015 06/23/2015  WBC 3.8 - 10.6 K/uL 12.9(H) 14.4(H) 17.8(H)  Hemoglobin 13.0 - 18.0 g/dL 8.6(L) 9.0(L) 9.4(L)  Hematocrit 40.0 - 52.0 % 26.0(L) 27.7(L) 29.0(L)  Platelets 150 - 440 K/uL 256 310 359    BMP Latest Ref Rng 06/25/2015 06/23/2015 06/22/2015  Glucose 65 - 99 mg/dL 85 - 112(H)  BUN 6 - 20 mg/dL 60(H) -  61(H)  Creatinine 0.61 - 1.24 mg/dL 2.42(H) 1.30(H) 1.48(H)  Sodium 135 - 145 mmol/L 137 - 138  Potassium 3.5 - 5.1 mmol/L 3.7 - 3.8  Chloride 101 - 111 mmol/L 111 - 108  CO2 22 - 32 mmol/L 21(L) - 25  Calcium 8.9 - 10.3 mg/dL 7.6(L) - 7.9(L)   Images:   CT abd/pelvis 9/11: IMPRESSION: 1. Increased abdominal ascites, still most prominent in the left lower quadrant. 2.  Focal dilation of small bowel at the site of previous perforation. The duodenum is not and enlarged but is inflamed proximal to this. 3. Contrast is present throughout nondilated small bowel and the residual proximal colon. 4. The area focal bowel inflammation is again seen in the left lower quadrant near the ostomy site. There is no definite free air. 5. New mild to moderate hydronephrosis is present bilaterally. 6. Stable small bilateral pleural effusions and associated Atelectasis.  Assessment/Plan:  81 yr old POD#15 from [facility] for acute Divertiticulitis Cr increased today to 2.4 but BUN still trending down, appreciate Nephrology input and agree that there is likely some obstructive component due to enlarged prostate that is likely driving the suprapubic pain.  Will perform I&O cath and post void caths and discussed placement of foley if post void residuals are elevated.  Will continue physical therapy, patient wife is very concerned about him getting enough here.  Patient will likely go to rehab later this week as Cr and pain improves.     Lavona Mound Loflin 06/25/2015

## 2015-06-25 NOTE — Progress Notes (Signed)
His pain is much improved over the last 24 hours. Review of his CT scan reveals new onset of bilateral hydronephrosis and an enlarged urinary bladder. It is unclear from his chart whether or not he is voiding regularly. He did have an in and out catheterizations morning which reportedly demonstrated 275 cc of concentrated urine. The nephrology service saw him this morning noting his elevated creatinine and suggested in and out catheterization a regular basis. I also reviewed his CT scan identifying the increased hydronephrosis suggestive of possible urinary retention and obstruction. He does have a markedly dilated prostate gland.  I spoke with the patient and his family at length. We will place Foley catheter or leave it at least 24 hours could've better understanding of his urinary function. It is possible that his elevated creatinine to simply from his CT contrast but his creatinine had been trending down recently and with the increase in hydronephrosis I'm concerned he does have an obstructive uropathy. They are in agreement with this current plan.

## 2015-06-25 NOTE — Progress Notes (Signed)
CSW confirmed with Liliane Channel at [facility] they are able to offer pt a bed pending medical clearance. CSW will continue to follow.  Wandra Feinstein, MSW, LCSW [phone] 2c coverage

## 2015-06-26 LAB — BASIC METABOLIC PANEL
ANION GAP: 3 — AB (ref 5–15)
BUN: 56 mg/dL — ABNORMAL HIGH (ref 6–20)
CO2: 22 mmol/L (ref 22–32)
Calcium: 7.5 mg/dL — ABNORMAL LOW (ref 8.9–10.3)
Chloride: 111 mmol/L (ref 101–111)
Creatinine, Ser: 2.4 mg/dL — ABNORMAL HIGH (ref 0.61–1.24)
GFR calc Af Amer: 26 mL/min — ABNORMAL LOW (ref >60)
GFR calc non Af Amer: 22 mL/min — ABNORMAL LOW (ref >60)
GLUCOSE: 102 mg/dL — AB (ref 65–99)
POTASSIUM: 3.3 mmol/L — AB (ref 3.5–5.1)
Sodium: 136 mmol/L (ref 135–145)

## 2015-06-26 LAB — GLUCOSE, CAPILLARY
GLUCOSE-CAPILLARY: 129 mg/dL — AB (ref 65–99)
Glucose-Capillary: 102 mg/dL — ABNORMAL HIGH (ref 65–99)
Glucose-Capillary: 102 mg/dL — ABNORMAL HIGH (ref 65–99)
Glucose-Capillary: 133 mg/dL — ABNORMAL HIGH (ref 65–99)

## 2015-06-26 MED ORDER — APIXABAN 2.5 MG PO TABS
2.5000 mg | ORAL_TABLET | Freq: Two times a day (BID) | ORAL | Status: DC
  Administered 2015-06-26 – 2015-06-27 (×2): 2.5 mg via ORAL
  Filled 2015-06-26 (×2): qty 1

## 2015-06-26 NOTE — Care Management Note (Signed)
Case Management Note  Patient Details  Name: Jesse Macias MRN: [id number] Date of Birth: Apr 16, 1924  Subjective/Objective:      Spoke to Dr Azalee Course, and addressed the idea of the pt. Being placed. She has already said she wanted the pt. Crea. To decrease, but told family Thursday or Friday for d/c. I have spoken to Dr Azalee Course and suggested we plant the idea seed of perhaps tomorrow for d/c if pt. Remains stable. Since he is up with PT, it would be nice to place them ASAP. She has agreed to this, so I spoke to the wife about perhaps getting him to placement tomorrow.   She is very hesitant because of events in the past where the pt. Was discharged and had to return. I have explained to her that the d/c is based on getting him to an appropriate facility when a bed comes available, since they may not be able to hold the bed for too long once offered .      We will see if the pt. Gets a bed offer  tomorrow.        Action/Plan:   Expected Discharge Date:                  Expected Discharge Plan:     In-House Referral:     Discharge planning Services     Post Acute Care Choice:    Choice offered to:     DME Arranged:    DME Agency:     HH Arranged:    HH Agency:     Status of Service:  In process, will continue to follow  Medicare Important Message Given:  Yes-fourth notification given Date Medicare IM Given:    Medicare IM give by:    Date Additional Medicare IM Given:    Additional Medicare Important Message give by:     If discussed at [place] of Stay Meetings, dates discussed:    Additional Comments:  Beau Fanny, RN 06/26/2015, 2:45 PM

## 2015-06-26 NOTE — Progress Notes (Signed)
Physical Therapy Treatment Patient Details Name: Jesse Macias MRN: [id number] DOB: 1923/11/17 Today's Date: 06/26/2015    History of Present Illness presented to ER with acute abdominal/LLQ pain; admitted with diverticulitis with microperforation.  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 Post-op course complicated by transfer to CCU due to afib with RVR (requiring amioderone drip). Pt with increased B LE swelling this date.    PT Comments    Pt complains of overall weakness and swelling throughout BLEs and BUEs. Spouse present. Pt continues to require Min A for all transfers and mobility. Pt is a heavy mouth breather and sounds out of breath throughout session; O2 saturation steady on room air at 97%.  Pt received up in chair; did not wish further exercise at this time due to fatigue. Pt/spouse encouraged to remain up in chair this afternoon as tolerated for a couple hours; they understand. Also encouraged LE exercises after resting a time. Continue PT for progression of strength, endurance, transfers and ambulation for improved overall functional mobility.   Follow Up Recommendations  SNF     Equipment Recommendations  Rolling walker with 5" wheels    Recommendations for Other Services       Precautions / Restrictions Precautions Precautions: Fall Restrictions Weight Bearing Restrictions: No    Mobility  Bed Mobility Overal bed mobility: Needs Assistance Bed Mobility: Supine to Sit     Supine to sit: Min assist (primarily for LEs)     General bed mobility comments: effortful  Transfers Overall transfer level: Needs assistance Equipment used: Rolling walker (2 wheeled) Transfers: Sit to/from Stand Sit to Stand: Min assist            Ambulation/Gait Ambulation/Gait assistance: Min assist Ambulation Distance (Feet): 74 Feet Assistive device: Rolling walker (2 wheeled) Gait  Pattern/deviations: Step-through pattern;Trunk flexed (mildly unsteady; effortful) Gait velocity: slow Gait velocity interpretation: <1.8 ft/sec, indicative of risk for recurrent falls General Gait Details:  (Takes several stand rest breaks;audible breath sounds)   Stairs            Wheelchair Mobility    Modified Rankin (Stroke Patients Only)       Balance Overall balance assessment: Needs assistance   Sitting balance-Leahy Scale: Good     Standing balance support: Bilateral upper extremity supported Standing balance-Leahy Scale: Fair                      Cognition Arousal/Alertness: Awake/alert Behavior During Therapy: WFL for tasks assessed/performed Overall Cognitive Status: Within Functional Limits for tasks assessed                      Exercises      General Comments        Pertinent Vitals/Pain Pain Assessment: No/denies pain    Home Living                      Prior Function            PT Goals (current goals can now be found in the care plan section) Progress towards PT goals: Progressing toward goals (slowly)    Frequency  Min 2X/week    PT Plan Current plan remains appropriate    Co-evaluation             End of Session Equipment Utilized During Treatment: Gait belt Activity Tolerance: Patient limited by fatigue Patient left: in chair;with chair alarm set;with family/visitor present  Time: 1429-1450 PT Time Calculation (min) (ACUTE ONLY): 21 min  Charges:  $Gait Training: 8-22 mins                    G Codes:      Jesse Macias 06/26/2015, 3:01 PM

## 2015-06-26 NOTE — Care Management Important Message (Signed)
Important Message  Patient Details  Name: Jesse Macias MRN: [id number] Date of Birth: [birth year]   Medicare Important Message Given:  Yes-fourth notification given    [name] 06/26/2015, 10:46 AM

## 2015-06-26 NOTE — Care Management Note (Signed)
Case Management Note  Patient Details  Name: Jesse Macias MRN: [id number] Date of Birth: 04-02-1924  Subjective/Objective:        Spoke to pt. About his level of function. The pt. States that he was normally independent , out  Mowing his lawn and tasks outside. Now he is very tired , since the infection. He is able to feed himself once set up, but he requires help bathing and toileting.  MD 's are following the creatinine , which they want to decrease before they are willing to d/c him.            Action/Plan:   Expected Discharge Date:                  Expected Discharge Plan:     In-House Referral:     Discharge planning Services     Post Acute Care Choice:    Choice offered to:     DME Arranged:    DME Agency:     HH Arranged:    HH Agency:     Status of Service:  In process, will continue to follow  Medicare Important Message Given:  Yes-fourth notification given Date Medicare IM Given:    Medicare IM give by:    Date Additional Medicare IM Given:    Additional Medicare Important Message give by:     If discussed at [place] of Stay Meetings, dates discussed:    Additional Comments:  Beau Fanny, RN 06/26/2015, 11:17 AM

## 2015-06-26 NOTE — Progress Notes (Signed)
Jesse Macias is a 81 y.o. male patient POD#16 from [facility] for acute diverticulitis.  Pain much improved today, states none of the previous suprapubic pain and no back pain.  He still doesn't have much of an appetite and discussed with wife that he can try anything he feels like eating.  He worked with physical therapy yesterday and walked into the hallway.    1. Perforated diverticulum of large intestine   2. Shortness of breath   3. Encounter for nasogastric (NG) tube placement   4. Ileus   5. Diverticulitis   6. Encounter for central line placement   7. SOB (shortness of breath)   8. Leukocytosis   9. Reflux   10. Acute renal failure   11. Diverticulitis large intestine     Past Medical History  Diagnosis Date  . Hypertension   . History of hiatal hernia   . Diabetes mellitus without complication     Current Facility-Administered Medications  Medication Dose Route Frequency Provider Last Rate Last Dose  . acetaminophen (TYLENOL) suppository 650 mg  650 mg Rectal Q6H PRN Marlyce Huge, MD      . acetaminophen (TYLENOL) tablet 650 mg  650 mg Oral Q6H PRN Sherri Rad, MD   650 mg at 06/23/15 2018  . alum & mag hydroxide-simeth (MAALOX/MYLANTA) 200-200-20 MG/5ML suspension 30 mL  30 mL Oral Q6H PRN Marlyce Huge, MD   30 mL at 06/15/15 1331  . apixaban (ELIQUIS) tablet 5 mg  5 mg Oral BID Max Sane, MD   5 mg at 06/25/15 2311  . artificial tears ophthalmic ointment   Both Eyes QHS PRN Lytle Butte, MD      . brimonidine (ALPHAGAN) 0.2 % ophthalmic solution 1 drop  1 drop Both Eyes BID Marlyce Huge, MD   1 drop at 06/25/15 0954  . calcium carbonate (TUMS - dosed in mg elemental calcium) chewable tablet 200 mg of elemental calcium  1 tablet Oral TID PRN Marlyce Huge, MD   200 mg of elemental calcium at 06/16/15 0500  . ciprofloxacin (CIPRO) IVPB 400 mg  400 mg Intravenous Q12H Max Sane, MD   400 mg at 06/26/15 0050  . dorzolamide (TRUSOPT) 2 %  ophthalmic solution 1 drop  1 drop Both Eyes BID Marlyce Huge, MD   1 drop at 06/25/15 2315  . feeding supplement (ENSURE ENLIVE) (ENSURE ENLIVE) liquid 237 mL  237 mL Oral BID AC Sherri Rad, MD   237 mL at 06/23/15 1750  . hydrALAZINE (APRESOLINE) injection 10 mg  10 mg Intravenous Q6H PRN Max Sane, MD   10 mg at 06/12/15 0406  . HYDROcodone-acetaminophen (NORCO/VICODIN) 5-325 MG per tablet 1-2 tablet  1-2 tablet Oral Q6H PRN Sherri Rad, MD   2 tablet at 06/24/15 1037  . HYDROmorphone (DILAUDID) injection 0.5 mg  0.5 mg Intravenous Q4H PRN Marlyce Huge, MD   0.5 mg at 06/24/15 1342  . insulin aspart (novoLOG) injection 0-15 Units  0-15 Units Subcutaneous TID WC Sherri Rad, MD   2 Units at 06/24/15 1327  . insulin aspart (novoLOG) injection 0-5 Units  0-5 Units Subcutaneous QHS Sherri Rad, MD   0 Units at 06/19/15 2155  . ipratropium-albuterol (DUONEB) 0.5-2.5 (3) MG/3ML nebulizer solution 3 mL  3 mL Nebulization Q4H PRN Marlyce Huge, MD      . latanoprost (XALATAN) 0.005 % ophthalmic solution 1 drop  1 drop Both Eyes QHS Marlyce Huge, MD   1 drop at 06/25/15 2319  . LORazepam (ATIVAN)  injection 0.5 mg  0.5 mg Intravenous Q6H PRN Nicholes Mango, MD      . menthol-cetylpyridinium (CEPACOL) lozenge 3 mg  1 lozenge Oral PRN Florene Glen, MD      . metoprolol (LOPRESSOR) injection 5 mg  5 mg Intravenous Q4H PRN Nicholes Mango, MD   5 mg at 06/11/15 0540  . metoprolol tartrate (LOPRESSOR) tablet 25 mg  25 mg Oral BID Yolonda Kida, MD   25 mg at 06/25/15 2313  . metroNIDAZOLE (FLAGYL) IVPB 500 mg  500 mg Intravenous Q8H Max Sane, MD   500 mg at 06/26/15 0417  . ondansetron (ZOFRAN-ODT) disintegrating tablet 4 mg  4 mg Oral Q6H PRN Marlyce Huge, MD       Or  . ondansetron [facility]) injection 4 mg  4 mg Intravenous Q6H PRN Marlyce Huge, MD   4 mg at 06/24/15 1342  . phenazopyridine (PYRIDIUM) tablet 100 mg  100 mg Oral TID WC Sherri Rad, MD   100 mg at  06/26/15 0839  . sodium chloride 0.9 % injection 10 mL  10 mL Intravenous PRN Marlyce Huge, MD   10 mL at 06/14/15 0516  . tamsulosin (FLOMAX) capsule 0.4 mg  0.4 mg Oral QPC supper Murlean Iba, MD   0.4 mg at 06/25/15 1905  . timolol (TIMOPTIC) 0.25 % ophthalmic solution 1 drop  1 drop Both Eyes BID Marlyce Huge, MD   1 drop at 06/25/15 2315   No Known Allergies Active Problems:   Acute diverticulitis   Perforated diverticulum of large intestine   Ileus  Blood pressure 134/68, pulse 74, temperature 98.1 F (36.7 C), temperature source Oral, resp. rate 20, height 5\' 5"  (1.651 m), weight 176 lb 4.8 oz (79.969 kg), SpO2 98 %.  Physical Exam  Constitutional: He is oriented to person, place, and time. He appears well-developed and well-nourished. No distress.  Cardiovascular: Normal rate and regular rhythm.   Pulmonary/Chest: Effort normal and breath sounds normal.  Abdominal: Soft. Nt, nd. Incision site c/d/i, ostomy pink patent with air and stool in bag Musculoskeletal: Normal range of motion. 2+ pitting edema in upper and lower extermities Neurological: He is alert and oriented to person, place, and time.  Skin: Skin is warm and dry. No rash noted. No erythema.  Vitals reviewed.   Labs:  CBC Latest Ref Rng 06/25/2015 06/24/2015 06/23/2015  WBC 3.8 - 10.6 K/uL 12.9(H) 14.4(H) 17.8(H)  Hemoglobin 13.0 - 18.0 g/dL 8.6(L) 9.0(L) 9.4(L)  Hematocrit 40.0 - 52.0 % 26.0(L) 27.7(L) 29.0(L)  Platelets 150 - 440 K/uL 256 310 359    BMP Latest Ref Rng 06/26/2015 06/25/2015 06/23/2015  Glucose 65 - 99 mg/dL 102(H) 85 -  BUN 6 - 20 mg/dL 56(H) 60(H) -  Creatinine 0.61 - 1.24 mg/dL 2.40(H) 2.42(H) 1.30(H)  Sodium 135 - 145 mmol/L 136 137 -  Potassium 3.5 - 5.1 mmol/L 3.3(L) 3.7 -  Chloride 101 - 111 mmol/L 111 111 -  CO2 22 - 32 mmol/L 22 21(L) -  Calcium 8.9 - 10.3 mg/dL 7.5(L) 7.6(L) -   Assessment/Plan:  81 yr old POD#16 from [facility] for acute Divertiticulitis Continue  on diet Continue cipro/flagyl day 8/14 HTN: continue with metoprolol and prns DM: continue with SSI COPD: continue with duoneb,  Acute on Chronic renal failure with obstructive component: improved with foley placement, will continue, flomax started, Cr. Same today, likely will trend down tomorrow Continue working with PT Placement to Rehab likely Thursday or Friday of this week   Barnetta Chapel  L Shaketa Serafin 06/26/2015

## 2015-06-26 NOTE — Progress Notes (Signed)
Subjective:   Foley cathter placed Overall clinically improved Family feels he is doing better No acute c/o UOP 1425 cc   Objective:  Vital signs in last 24 hours:  Temp:  [98 F (36.7 C)-98.1 F (36.7 C)] 98.1 F (36.7 C) (09/13 0455) Pulse Rate:  [66-75] 74 (09/13 0937) Resp:  [18-20] 20 (09/13 0455) BP: (100-141)/(53-68) 134/68 mmHg (09/13 0937) SpO2:  [98 %-100 %] 98 % (09/13 0937) Weight:  [79.969 kg (176 lb 4.8 oz)] 79.969 kg (176 lb 4.8 oz) (09/13 0500)  Weight change: 3.856 kg (8 lb 8 oz) Filed Weights   06/24/15 0500 06/25/15 0448 06/26/15 0500  Weight: 74.299 kg (163 lb 12.8 oz) 76.114 kg (167 lb 12.8 oz) 79.969 kg (176 lb 4.8 oz)    Intake/Output: I/O last 3 completed shifts: In: 2071.3 [P.O.:120; I.V.:1028.3; IV Piggyback:923] Out: 1600 [Urine:1425; Stool:175]   Intake/Output this shift:  Total I/O In: 0  Out: 225 [Urine:225]  Physical Exam: General: NAD, elderly, fragile, sitting up in chair  Head: Normocephalic, atraumatic  Eyes: Anicteric,   Neck: Supple, trachea midline  Lungs:  Clear to auscultation  Heart: Regular rate and rhythm  Abdomen:  +ostomy, midline incision, Distended, soft  Extremities: ++ peripheral edema. + dependent edema in arms  Neurologic: Nonfocal, moving all four extremities, alert, able to communicate  Skin: No acute lesions  GU  Foley in place    Basic Metabolic Panel:  Recent Labs Lab 06/20/15 0429 06/21/15 0745 06/22/15 0429 06/23/15 0518 06/25/15 0319 06/26/15 0410  NA 134* 135 138  --  137 136  K 3.5 3.8 3.8  --  3.7 3.3*  CL 102 105 108  --  111 111  CO2 _0 --  21* 22  GLUCOSE 113* 89 112*  --  85 102*  BUN 69* 66* 61*  --  60* 56*  CREATININE 1.67* 1.55* 1.48* 1.30* 2.42* 2.40*  CALCIUM 8.4* 8.0* 7.9*  --  7.6* 7.5*  MG 2.0  --   --   --   --   --   PHOS 3.1  --   --   --  4.1  --     Liver Function Tests:  Recent Labs Lab 06/22/15 0429 06/25/15 0319  AST 29  --   ALT 56  --   ALKPHOS  80  --   BILITOT 1.3*  --   PROT 4.0*  --   ALBUMIN 1.9* 1.9*   No results for input(s): LIPASE, AMYLASE in the last 168 hours. No results for input(s): AMMONIA in the last 168 hours.  CBC:  Recent Labs Lab 06/20/15 0429 06/21/15 0745 06/22/15 0429 06/23/15 0518 06/24/15 0518 06/25/15 0319  WBC 26.5* 24.1* 20.4* 17.8* 14.4* 12.9*  NEUTROABS 23.0*  --   --   --   --   --   HGB 9.6* 8.9* 8.9* 9.4* 9.0* 8.6*  HCT 28.2* 27.0* 26.4* 29.0* 27.7* 26.0*  MCV 92.7 92.8 93.6 94.1 95.5 95.4  PLT 417 368 353 359 310 256    Cardiac Enzymes: No results for input(s): CKTOTAL, CKMB, CKMBINDEX, TROPONINI in the last 168 hours.  BNP: Invalid input(s): POCBNP  CBG:  Recent Labs Lab 06/24/15 2110 06/25/15 0731 06/25/15 1140 06/25/15 1639 06/26/15 0745  GLUCAP 102* 78 100* 119* 102*    Microbiology: Results for orders placed or performed during the hospital encounter of 06/02/15  MRSA PCR Screening     Status: None   Collection Time: 06/08/15 11:36 PM  Result Value Ref Range Status   MRSA by PCR NEGATIVE NEGATIVE Final    Comment:        The GeneXpert MRSA Assay (FDA approved for NASAL specimens only), is one component of a comprehensive MRSA colonization surveillance program. It is not intended to diagnose MRSA infection nor to guide or monitor treatment for MRSA infections.   Urine culture     Status: None   Collection Time: 06/15/15  3:45 PM  Result Value Ref Range Status   Specimen Description URINE, CLEAN CATCH  Final   Special Requests zosyn Normal  Final   Culture NO GROWTH 2 DAYS  Final   Report Status 06/17/2015 FINAL  Final  Culture, blood (routine x 2)     Status: None   Collection Time: 06/15/15  3:54 PM  Result Value Ref Range Status   Specimen Description BLOOD LEFT ASSIST CONTROL  Final   Special Requests BOTTLES DRAWN AEROBIC AND ANAEROBIC  6CC  Final   Culture NO GROWTH 5 DAYS  Final   Report Status 06/20/2015 FINAL  Final  Culture, blood  (routine x 2)     Status: None   Collection Time: 06/15/15  4:00 PM  Result Value Ref Range Status   Specimen Description BLOOD RIGHT ASSIST CONTROL  Final   Special Requests   Final    BOTTLES DRAWN AEROBIC AND ANAEROBIC  AER 113 CC ANA 11CC   Culture NO GROWTH 5 DAYS  Final   Report Status 06/20/2015 FINAL  Final    Coagulation Studies: No results for input(s): LABPROT, INR in the last 72 hours.  Urinalysis: No results for input(s): COLORURINE, LABSPEC, PHURINE, GLUCOSEU, HGBUR, BILIRUBINUR, KETONESUR, PROTEINUR, UROBILINOGEN, NITRITE, LEUKOCYTESUR in the last 72 hours.  Invalid input(s): APPERANCEUR    Imaging: Ct Abdomen Pelvis W Contrast  06/24/2015   CLINICAL DATA:  Diverticulitis of the large intestine. Status post sigmoid colectomy with and colostomy. Persistent suprapubic pressure in difficulty with urination. Dysuria.  EXAM: CT ABDOMEN AND PELVIS WITH CONTRAST  TECHNIQUE: Multidetector CT imaging of the abdomen and pelvis was performed using the standard protocol following bolus administration of intravenous contrast.  CONTRAST:  83m OMNIPAQUE IOHEXOL 300 MG/ML  SOLN  COMPARISON:  CT abdomen pelvis 06/16/2015.  FINDINGS: Bilateral pleural effusions are similar to the prior exam. Associated atelectasis is noted.  Heart size is normal. Coronary artery calcifications are again noted.  A simple cyst in the a caudate head of the liver measures 15 mm. The liver is otherwise unremarkable. Spleen is within normal limits. Abdominal ascites have increased with fluid around the free edge of the liver and about the spleen.  The distal esophagus is mildly dilated. The stomach is moderately distended as well. Gastric distention has increased. Inflammatory changes about the third portion of the duodenum have increased. There is diffuse fatty atrophy of the pancreas.  The common bile duct and gallbladder are within normal limits. The adrenal glands are normal. A 2.8 cm cyst at the lower pole of the  right kidney is stable.  Mild bilateral hydronephrosis is new. The ureters are dilated into the anatomic pelvis.  Ascites within the anatomic pelvis has increased. There is fluid along the pericolic gutter bilaterally. No discrete peripherally enhancing abscess is evident. Persistent inflammatory changes are noted at the site of previous perforation. There is focal dilation of the small bowel which represents proximal jejunum at this level as well. This suggests there is an inflammatory component at this level, near the colostomy site.  Oral contrast is well seen into the residual right-sided colon to the colostomy site.  The urinary bladder is moderately dilated. The prostate is enlarged.  Bone windows demonstrate Schmorl's nodes. No focal lytic or blastic lesions are present.  IMPRESSION: 1. Increased abdominal ascites, still most prominent in the left lower quadrant. 2. Focal dilation of small bowel at the site of previous perforation. The duodenum is not and enlarged but is inflamed proximal to this. 3. Contrast is present throughout nondilated small bowel and the residual proximal colon. 4. The area focal bowel inflammation is again seen in the left lower quadrant near the ostomy site. There is no definite free air. 5. New mild to moderate hydronephrosis is present bilaterally. 6. Stable small bilateral pleural effusions and associated atelectasis.   Electronically Signed   By: San Morelle M.D.   On: 06/24/2015 15:38     Medications:     . apixaban  5 mg Oral BID  . brimonidine  1 drop Both Eyes BID  . ciprofloxacin  400 mg Intravenous Q12H  . dorzolamide  1 drop Both Eyes BID  . feeding supplement (ENSURE ENLIVE)  237 mL Oral BID AC  . insulin aspart  0-15 Units Subcutaneous TID WC  . insulin aspart  0-5 Units Subcutaneous QHS  . latanoprost  1 drop Both Eyes QHS  . metoprolol tartrate  25 mg Oral BID  . metronidazole  500 mg Intravenous Q8H  . phenazopyridine  100 mg Oral TID WC  .  tamsulosin  0.4 mg Oral QPC supper  . timolol  1 drop Both Eyes BID   [DISCONTINUED] acetaminophen **OR** acetaminophen, acetaminophen, alum & mag hydroxide-simeth, artificial tears, calcium carbonate, hydrALAZINE, HYDROcodone-acetaminophen, HYDROmorphone (DILAUDID) injection, ipratropium-albuterol, LORazepam, menthol-cetylpyridinium, metoprolol, ondansetron **OR** ondansetron (ZOFRAN) IV, sodium chloride  Assessment/ Plan:  [name] is a 81 y.o. white male with hypertension, hyperlipidemia, peripheral neurpathy and glaucoma, was admitted to [facility] on 06/02/2015 with perforated diverticulits.   1. Acute renal failure on chronic kidney disease stage III with baseline creatinine of 1.7, eGFR of 38. CKD secondary to hypertension. CT without renal lesions - New round of ARF likely secondary to IV CONTRAST exposure 9/10 and urinary retention - Continue to monitor urine output, volume status and renal function. Nonoliguric output.  - on losartan as outpatient. Holding currently - discontinue IV fluids - Electrolytes and Volume status are acceptable   No acute indication for Dialysis at present   2. Hyponatremia and hypokalemia : improved Na, potassium   3. Hypertension: holding losartan and hydrochlorothiazide. Well controlled.  -  metoprolol  -  avoid diuretics at present   4. Anemia of CKD/blood loss: hemoglobin 8.6 - low threshold to start epo.   5. urinary retention -  likely BPH -  start flomax -  foley placed      LOS: 24 Jesse Macias 9/13/20169:51 AM

## 2015-06-26 NOTE — Progress Notes (Signed)
MEDICATION RELATED CONSULT NOTE - FOLLOW UP   Pharmacy Consult for Apixaban (Eliquis) Indication: Apixaban for Afib  No Known Allergies  Patient Measurements: Height: 5\' 5"  (165.1 cm) Weight: 176 lb 4.8 oz (79.969 kg) IBW/kg (Calculated) : 61.5   Vital Signs: Temp: 97.6 F (36.4 C) (09/13 1321) Temp Source: Oral (09/13 1321) BP: 106/55 mmHg (09/13 1321) Pulse Rate: 69 (09/13 1321)  Labs:  Recent Labs  06/24/15 0518 06/25/15 0319 06/26/15 0410  WBC 14.4* 12.9*  --   HGB 9.0* 8.6*  --   HCT 27.7* 26.0*  --   PLT 310 256  --   CREATININE  --  2.42* 2.40*  PHOS  --  4.1  --   ALBUMIN  --  1.9*  --    Estimated Creatinine Clearance: 20.3 mL/min (by C-G formula based on Cr of 2.4).   Assessment: Apixaban: Patient previously on Apixaban 2.5 mg po BID chronically for anticoagulation due to atrial fibrillation. Medication was withheld upon admission due to surgery.  Per Surgery note today , 06/12/15, OK to begin anticoagulation  Plan:   Current orders for apixaban 5mg  PO BID Current orders for apixaban 2.5 mg po bid. Patient's SCr today is 2.40. Will adjust apixaban to 2.5 mg PO BID based on SCr >1.5 with TBW > 60kg and age > 52 yrs  Pharmacy will continue to follow.  Rexene Edison, PharmD Clinical Pharmacist   06/26/2015 1:55 PM

## 2015-06-27 ENCOUNTER — Encounter
Admission: EM | Disposition: A | Discharge status: Skilled Nursing Facility | Payer: Self-pay | Source: Home / Self Care | Attending: Surgery

## 2015-06-27 LAB — GLUCOSE, CAPILLARY
GLUCOSE-CAPILLARY: 103 mg/dL — AB (ref 65–99)
Glucose-Capillary: 142 mg/dL — ABNORMAL HIGH (ref 65–99)

## 2015-06-27 LAB — BASIC METABOLIC PANEL
ANION GAP: 3 — AB (ref 5–15)
BUN: 48 mg/dL — ABNORMAL HIGH (ref 6–20)
CALCIUM: 7.6 mg/dL — AB (ref 8.9–10.3)
CO2: 22 mmol/L (ref 22–32)
Chloride: 115 mmol/L — ABNORMAL HIGH (ref 101–111)
Creatinine, Ser: 1.75 mg/dL — ABNORMAL HIGH (ref 0.61–1.24)
GFR, EST AFRICAN AMERICAN: 38 mL/min — AB (ref >60)
GFR, EST NON AFRICAN AMERICAN: 33 mL/min — AB (ref >60)
Glucose, Bld: 106 mg/dL — ABNORMAL HIGH (ref 65–99)
Potassium: 3.2 mmol/L — ABNORMAL LOW (ref 3.5–5.1)
Sodium: 140 mmol/L (ref 135–145)

## 2015-06-27 SURGERY — LEFT HEART CATH AND CORONARY ANGIOGRAPHY

## 2015-06-27 MED ORDER — ONDANSETRON 4 MG PO TBDP: 4.0000 mg | ORAL_TABLET | Freq: Four times a day (QID) | ORAL | Status: DC | PRN

## 2015-06-27 MED ORDER — CIPROFLOXACIN HCL 500 MG PO TABS: 500.0000 mg | ORAL_TABLET | Freq: Two times a day (BID) | ORAL | Status: AC

## 2015-06-27 MED ORDER — METRONIDAZOLE 500 MG PO TABS: 500.0000 mg | ORAL_TABLET | Freq: Three times a day (TID) | ORAL | Status: AC

## 2015-06-27 MED ORDER — TAMSULOSIN HCL 0.4 MG PO CAPS: 0.4000 mg | ORAL_CAPSULE | Freq: Every day | ORAL | Status: DC

## 2015-06-27 MED ORDER — APIXABAN 2.5 MG PO TABS: 2.5000 mg | ORAL_TABLET | Freq: Two times a day (BID) | ORAL | Status: DC

## 2015-06-27 MED ORDER — ACETAMINOPHEN 325 MG PO TABS: 650.0000 mg | ORAL_TABLET | Freq: Four times a day (QID) | ORAL | Status: DC | PRN

## 2015-06-27 MED ORDER — HYDROCODONE-ACETAMINOPHEN 5-325 MG PO TABS: 1.0000 | ORAL_TABLET | Freq: Four times a day (QID) | ORAL | Status: DC | PRN

## 2015-06-27 MED ORDER — ENSURE ENLIVE PO LIQD: 237.0000 mL | Freq: Two times a day (BID) | ORAL | Status: DC

## 2015-06-27 NOTE — Progress Notes (Signed)
Physical Therapy Treatment Patient Details Name: Sirron Loga MRN: [id number] DOB: Feb 11, 1924 Today's Date: 06/27/2015    History of Present Illness presented to ER with acute abdominal/LLQ pain; admitted with diverticulitis with microperforation.  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 Post-op course complicated by transfer to CCU due to afib with RVR (requiring amioderone drip). Pt with increased B LE swelling this date.    PT Comments    Pt currently has discharge orders to skilled nursing facility. Pt willing to get out of bed and sit up in chair. Pt feels too weak today to ambulate further. Discussion with pt and spouse reveals pt is not eating much; education to pt/spouse regarding importance of adequate intake for healing/strength. Pt notes no nausea, but full feeling and "no where for food to go" feeling. Discussed options of of liquid/shakes with nutrition. Pt notes he enjoyed shake yesterday provided by nursing. Encouraged pt/spouse to request. Pt comfortably in chair.   Follow Up Recommendations  SNF     Equipment Recommendations  Rolling walker with 5" wheels    Recommendations for Other Services       Precautions / Restrictions Precautions Precautions: Fall Restrictions Weight Bearing Restrictions: No    Mobility  Bed Mobility Overal bed mobility: Needs Assistance Bed Mobility: Supine to Sit     Supine to sit: Min assist     General bed mobility comments: effortful  Transfers Overall transfer level: Needs assistance Equipment used: Rolling walker (2 wheeled) Transfers: Sit to/from Stand Sit to Stand: Min assist (cues for hands)            Ambulation/Gait Ambulation/Gait assistance: Min assist Ambulation Distance (Feet): 3 Feet Assistive device: Rolling walker (2 wheeled) Gait Pattern/deviations: Step-to pattern Gait velocity: slow Gait velocity  interpretation: <1.8 ft/sec, indicative of risk for recurrent falls General Gait Details: several steps bed to chair; pt did not feel he had enough strength to ambulate furhter today   Stairs            Wheelchair Mobility    Modified Rankin (Stroke Patients Only)       Balance           Standing balance support: Bilateral upper extremity supported Standing balance-Leahy Scale: Fair                      Cognition Arousal/Alertness: Awake/alert (fatigued) Behavior During Therapy: WFL for tasks assessed/performed Overall Cognitive Status: Within Functional Limits for tasks assessed                      Exercises      General Comments        Pertinent Vitals/Pain Pain Assessment: No/denies pain    Home Living                      Prior Function            PT Goals (current goals can now be found in the care plan section) Progress towards PT goals: Progressing toward goals (slowly)    Frequency  Min 2X/week    PT Plan Current plan remains appropriate (pt currently has discharge orders to SNF)    Co-evaluation             End of Session Equipment Utilized During Treatment: Gait belt Activity Tolerance: Patient limited by fatigue Patient left: in chair;with call bell/phone within reach;with chair alarm set;with family/visitor present  Time: [id number] PT Time Calculation (min) (ACUTE ONLY): 13 min  Charges:  $Gait Training: 8-22 mins                    G Codes:      [name] 06/27/2015, 12:06 PM

## 2015-06-27 NOTE — Progress Notes (Signed)
Subjective:   Foley cathter in place Overall clinically improved Family feels he is doing better No acute c/o UOP 1350 cc  Serum creatinine improved down to 1.75   Objective:  Vital signs in last 24 hours:  Temp:  [97.6 F (36.4 C)-98.5 F (36.9 C)] 98 F (36.7 C) (09/14 0453) Pulse Rate:  [69-80] 80 (09/14 0958) Resp:  [17-19] 17 (09/14 0453) BP: (106-134)/(55-73) 124/65 mmHg (09/14 0958) SpO2:  [97 %-100 %] 98 % (09/14 0958) Weight:  [80.332 kg (177 lb 1.6 oz)] 80.332 kg (177 lb 1.6 oz) (09/14 0605)  Weight change: 0.363 kg (12.8 oz) Filed Weights   06/25/15 0448 06/26/15 0500 06/27/15 0605  Weight: 76.114 kg (167 lb 12.8 oz) 79.969 kg (176 lb 4.8 oz) 80.332 kg (177 lb 1.6 oz)    Intake/Output: I/O last 3 completed shifts: In: 38 [IV Piggyback:923] Out: 93 [Urine:2550; Stool:200]   Intake/Output this shift:  Total I/O In: 480 [P.O.:480] Out: 175 [Urine:175]  Physical Exam: General: NAD, elderly, fragile, sitting up in chair  Head: Normocephalic, atraumatic  Eyes: Anicteric,   Neck: Supple, trachea midline  Lungs:  Clear to auscultation  Heart: Regular rate and rhythm  Abdomen:  +ostomy, midline incision, Distended, soft  Extremities: ++ peripheral edema. + dependent edema in arms  Neurologic: Nonfocal, moving all four extremities, alert, able to communicate  Skin: No acute lesions,   GU  Foley in place    Basic Metabolic Panel:  Recent Labs Lab 06/21/15 0745 06/22/15 0429 06/23/15 0518 06/25/15 0319 06/26/15 0410 06/27/15 0520  NA 135 138  --  137 136 140  K 3.8 3.8  --  3.7 3.3* 3.2*  CL 105 108  --  111 111 115*  CO2 25 25  --  21* 22 22  GLUCOSE 89 112*  --  85 102* 106*  BUN 66* 61*  --  60* 56* 48*  CREATININE 1.55* 1.48* 1.30* 2.42* 2.40* 1.75*  CALCIUM 8.0* 7.9*  --  7.6* 7.5* 7.6*  PHOS  --   --   --  4.1  --   --     Liver Function Tests:  Recent Labs Lab 06/22/15 0429 06/25/15 0319  AST 29  --   ALT 56  --   ALKPHOS 80   --   BILITOT 1.3*  --   PROT 4.0*  --   ALBUMIN 1.9* 1.9*   No results for input(s): LIPASE, AMYLASE in the last 168 hours. No results for input(s): AMMONIA in the last 168 hours.  CBC:  Recent Labs Lab 06/21/15 0745 06/22/15 0429 06/23/15 0518 06/24/15 0518 06/25/15 0319  WBC 24.1* 20.4* 17.8* 14.4* 12.9*  HGB 8.9* 8.9* 9.4* 9.0* 8.6*  HCT 27.0* 26.4* 29.0* 27.7* 26.0*  MCV 92.8 93.6 94.1 95.5 95.4  PLT 368 353 359 310 256    Cardiac Enzymes: No results for input(s): CKTOTAL, CKMB, CKMBINDEX, TROPONINI in the last 168 hours.  BNP: Invalid input(s): POCBNP  CBG:  Recent Labs Lab 06/26/15 0745 06/26/15 1117 06/26/15 1622 06/26/15 2037 06/27/15 0738  GLUCAP 102* 102* 129* 133* 103*    Microbiology: Results for orders placed or performed during the hospital encounter of 06/02/15  MRSA PCR Screening     Status: None   Collection Time: 06/08/15 11:36 PM  Result Value Ref Range Status   MRSA by PCR NEGATIVE NEGATIVE Final    Comment:        The GeneXpert MRSA Assay (FDA approved for NASAL specimens only), is  one component of a comprehensive MRSA colonization surveillance program. It is not intended to diagnose MRSA infection nor to guide or monitor treatment for MRSA infections.   Urine culture     Status: None   Collection Time: 06/15/15  3:45 PM  Result Value Ref Range Status   Specimen Description URINE, CLEAN CATCH  Final   Special Requests zosyn Normal  Final   Culture NO GROWTH 2 DAYS  Final   Report Status 06/17/2015 FINAL  Final  Culture, blood (routine x 2)     Status: None   Collection Time: 06/15/15  3:54 PM  Result Value Ref Range Status   Specimen Description BLOOD LEFT ASSIST CONTROL  Final   Special Requests BOTTLES DRAWN AEROBIC AND ANAEROBIC  6CC  Final   Culture NO GROWTH 5 DAYS  Final   Report Status 06/20/2015 FINAL  Final  Culture, blood (routine x 2)     Status: None   Collection Time: 06/15/15  4:00 PM  Result Value Ref Range  Status   Specimen Description BLOOD RIGHT ASSIST CONTROL  Final   Special Requests   Final    BOTTLES DRAWN AEROBIC AND ANAEROBIC  AER 113 CC ANA 11CC   Culture NO GROWTH 5 DAYS  Final   Report Status 06/20/2015 FINAL  Final    Coagulation Studies: No results for input(s): LABPROT, INR in the last 72 hours.  Urinalysis: No results for input(s): COLORURINE, LABSPEC, PHURINE, GLUCOSEU, HGBUR, BILIRUBINUR, KETONESUR, PROTEINUR, UROBILINOGEN, NITRITE, LEUKOCYTESUR in the last 72 hours.  Invalid input(s): APPERANCEUR    Imaging: No results found.   Medications:     . apixaban  2.5 mg Oral BID  . brimonidine  1 drop Both Eyes BID  . ciprofloxacin  400 mg Intravenous Q12H  . dorzolamide  1 drop Both Eyes BID  . feeding supplement (ENSURE ENLIVE)  237 mL Oral BID AC  . insulin aspart  0-15 Units Subcutaneous TID WC  . insulin aspart  0-5 Units Subcutaneous QHS  . latanoprost  1 drop Both Eyes QHS  . metoprolol tartrate  25 mg Oral BID  . metronidazole  500 mg Intravenous Q8H  . tamsulosin  0.4 mg Oral QPC supper  . timolol  1 drop Both Eyes BID   [DISCONTINUED] acetaminophen **OR** acetaminophen, acetaminophen, alum & mag hydroxide-simeth, artificial tears, calcium carbonate, hydrALAZINE, HYDROcodone-acetaminophen, HYDROmorphone (DILAUDID) injection, ipratropium-albuterol, LORazepam, menthol-cetylpyridinium, metoprolol, ondansetron **OR** ondansetron (ZOFRAN) IV, sodium chloride  Assessment/ Plan:  Mr. Tyra Michelle is a 81 y.o. white male with hypertension, hyperlipidemia, peripheral neurpathy and glaucoma, was admitted to [facility] on 06/02/2015 with perforated diverticulits.   1. Acute renal failure on chronic kidney disease stage III with baseline creatinine of 1.7, eGFR of 38. CKD secondary to hypertension. CT without renal lesions - New round of ARF likely secondary to IV CONTRAST exposure 9/10 and urinary retention - Continue to monitor urine output, volume status and renal  function. Nonoliguric output.  - on losartan as outpatient. Holding currently - discontinue IV fluids - Electrolytes and Volume status are acceptable   No acute indication for Dialysis at present  - Serum creatinine improved to 1.75 - Patient will need outpatient urology as well as nephrology follow-up  2. Hyponatremia and hypokalemia : improved Na, potassium   3. Hypertension: holding losartan and hydrochlorothiazide. Well controlled.  -  metoprolol  -  avoid diuretics at present   4. Anemia of CKD/blood loss: hemoglobin 8.6  5. urinary retention -  likely BPH -  continue flomax -  foley placed - urology follow up      LOS: 25 Benjermin Korber 9/14/201610:50 AM

## 2015-06-27 NOTE — Progress Notes (Signed)
Called report to [facility]. IV and CVC were removed. Pressure applied for 15 mins to the CVC site after 2 sutures were clipped. Tegaderm then placed over site. Ostomy and Foley were both emptied before transport. Volunteer was called to wheel pt downstairs for transport with wife to facility. 2 extra colostomy bags were sent with the pt along with his eyedrops.

## 2015-06-27 NOTE — Clinical Social Work Note (Signed)
Patient is to discharge today to [facility]. Patient's wife is aware and discharge summary sent. Nurse to call report. Patient's wife to decide between transporting him or EMS transport which may not be covered. Shela Leff MSW,LCSW [phone]

## 2015-06-27 NOTE — Clinical Social Work Placement (Signed)
   CLINICAL SOCIAL WORK PLACEMENT  NOTE  Date:  06/27/2015  Patient Details  Name: Jesse Macias MRN: [id number] Date of Birth: 01/05/1924  Clinical Social Work is seeking post-discharge placement for this patient at the [facility] level of care (*CSW will initial, date and re-position this form in  chart as items are completed):  Yes   Patient/family provided with [facility] Work Department's list of facilities offering this level of care within the geographic area requested by the patient (or if unable, by the patient's family).  Yes   Patient/family informed of their freedom to choose among providers that offer the needed level of care, that participate in Medicare, Medicaid or managed care program needed by the patient, have an available bed and are willing to accept the patient.  Yes   Patient/family informed of [facility]'s ownership interest in [facility] and [facility], as well as of the fact that they are under no obligation to receive care at these facilities.  PASRR submitted to EDS on 06/27/15     PASRR number received on 06/27/15     Existing PASRR number confirmed on       FL2 transmitted to all facilities in geographic area requested by pt/family on 06/21/15     FL2 transmitted to all facilities within larger geographic area on       Patient informed that his/her managed care company has contracts with or will negotiate with certain facilities, including the following:        Yes   Patient/family informed of bed offers received.  Patient chooses bed at  [facility])     Physician recommends and patient chooses bed at  [facility])    Patient to be transferred to  [facility]) on 06/27/15.  Patient to be transferred to facility by EMS or car: wife to decide     Patient family notified on 06/27/15 of transfer.  Name of family member notified:   (patient and wife)     PHYSICIAN Please sign FL2     Additional Comment:     _______________________________________________ Shela Leff, LCSW 06/27/2015, 12:01 PM

## 2015-06-27 NOTE — Discharge Summary (Signed)
Physician Discharge Summary  Patient ID: Jesse Macias MRN: [id number] DOB/AGE: Mar 06, 1924 81 y.o.  Admit date: 06/02/2015 Discharge date: 06/27/2015  Admission Diagnoses: Acute Diverticulitis  Discharge Diagnoses:  Active Problems:   Acute diverticulitis   Perforated diverticulum of large intestine   Ileus   Discharged Condition: fair  Hospital Course: 81 yr old with acute diverticulitis.  Patient did not progress while in the hospital and worsened.  He was taken to the operating room on 8//27/2016 with Dr. Burt Knack for [facility] procedure.  Patient had acute on chronic kidney disease and nephrology was consulted, but no dialysis was needed and Creatine now improved.  Patient slowly improved after surgery and now is tolerating a regular diet.  He now has output from his ostomy. He is very deconditioned and physical therapy evaluated him and found appropriate for SNF placement.  He also had urinary retention and some component of obstruction due in large prostate, a foley was placed and he will go to the SNF with that.  He was also placed on Flomax for his BPH.    Consults: nephrology and Physical therapy  Significant Diagnostic Studies: none  Treatments: antibiotics: Cipro and metronidazole  Discharge Exam: Blood pressure 124/65, pulse 80, temperature 98 F (36.7 C), temperature source Oral, resp. rate 17, height 5\' 5"  (1.651 m), weight 177 lb 1.6 oz (80.332 kg), SpO2 98 %. General appearance: alert, appears stated age and no distress GI: soft, non tender, ostomy site pink, patent with air and stool in bag, incision site c/d/i Extremities: 2+ pitting edema in lower extremities, 1+ pitting edema in [place]  Disposition: Final discharge disposition not confirmed      Discharge Instructions    Diet general    Complete by:  As directed      Discharge instructions    Complete by:  As directed   May shower or sponge bath, no soaking in tub Ostomy care, emptying bag prn and changing  appliance weekly and prn     Increase activity slowly    Complete by:  As directed      Lifting restrictions    Complete by:  As directed   No lifting more than 15 lbs     No dressing needed    Complete by:  As directed             Medication List    TAKE these medications        acetaminophen 325 MG tablet  Commonly known as:  TYLENOL  Take 2 tablets (650 mg total) by mouth every 6 (six) hours as needed for mild pain.     apixaban 2.5 MG Tabs tablet  Commonly known as:  ELIQUIS  Take 1 tablet (2.5 mg total) by mouth 2 (two) times daily.     brimonidine 0.2 % ophthalmic solution  Commonly known as:  ALPHAGAN  Place 2 drops into both eyes daily.     ciprofloxacin 500 MG tablet  Commonly known as:  CIPRO  Take 1 tablet (500 mg total) by mouth 2 (two) times daily.     dorzolamide 2 % ophthalmic solution  Commonly known as:  TRUSOPT  Place 2 drops into both eyes daily.     feeding supplement (ENSURE ENLIVE) Liqd  Take 237 mLs by mouth 2 (two) times daily before lunch and supper.     HYDROcodone-acetaminophen 5-325 MG per tablet  Commonly known as:  NORCO/VICODIN  Take 1-2 tablets by mouth every 6 (six) hours as needed for moderate pain.  latanoprost 0.005 % ophthalmic solution  Commonly known as:  XALATAN  Place 2 drops into both eyes daily.     losartan-hydrochlorothiazide 100-12.5 MG per tablet  Commonly known as:  HYZAAR  Take 1 tablet by mouth daily.     metroNIDAZOLE 500 MG tablet  Commonly known as:  FLAGYL  Take 1 tablet (500 mg total) by mouth 3 (three) times daily.     ondansetron 4 MG disintegrating tablet  Commonly known as:  ZOFRAN-ODT  Take 1 tablet (4 mg total) by mouth every 6 (six) hours as needed for nausea.     tamsulosin 0.4 MG Caps capsule  Commonly known as:  FLOMAX  Take 1 capsule (0.4 mg total) by mouth daily after supper.     timolol 0.25 % ophthalmic solution  Commonly known as:  TIMOPTIC  Place 2 drops into both eyes daily.      TOPROL XL 200 MG 24 hr tablet  Generic drug:  metoprolol  Take 1 tablet by mouth daily.       Follow-up Information    Follow up with Phoebe Perch, MD. Go on 07/11/2015.   Specialty:  Surgery   Why:  wednesday at 9:00am For wound re-check hospital follow-up.   Contact information:   [address] [place] [address] [phone]       Follow up with Kirk Ruths., MD In 2 weeks.   Specialty:  Internal Medicine   Contact information:   [place] [address] [phone]       Signed: Hubbard Robinson 06/27/2015, 11:35 AM

## 2015-07-06 ENCOUNTER — Encounter: Payer: Self-pay | Admitting: *Deleted

## 2015-07-06 ENCOUNTER — Other Ambulatory Visit: Payer: Self-pay | Admitting: *Deleted

## 2015-07-11 ENCOUNTER — Other Ambulatory Visit: Payer: Self-pay | Admitting: *Deleted

## 2015-07-11 ENCOUNTER — Ambulatory Visit (INDEPENDENT_AMBULATORY_CARE_PROVIDER_SITE_OTHER): Payer: Medicare Other | Admitting: Surgery

## 2015-07-11 ENCOUNTER — Encounter: Payer: Self-pay | Admitting: Surgery

## 2015-07-11 VITALS — BP 135/70 | HR 72 | Temp 97.7°F

## 2015-07-11 DIAGNOSIS — K5732 Diverticulitis of large intestine without perforation or abscess without bleeding: Secondary | ICD-10-CM

## 2015-07-11 NOTE — Progress Notes (Signed)
Surgery clinic  81 year old male underwent Hartman's procedure in the middle of August. Postoperative course was prolonged nearly 3-1/2 weeks with ileus retention of urine requiring Foley catheter. He eventually went to a skilled nursing facility for rehabilitation and is slowly improving. Today seen in the office as his first postoperative follow-up. There is been no issues with the colostomy. The patient is eating. He is accompanied by his wife. He is making progress with physical therapy. There still lower extremity swelling.  Physical examination the patient is alert and oriented. His wound is clean dry and intact and staples were removed and the left lower quadrant ostomy is seated nicely and functioning. There is 4+ pitting edema to the knee. Edema appears to be symmetrical compared from one leg to the other. The other swelling that will was present around his elbows and sacrum while he was in the hospital has completely resolved.  Impression he's making slow improvement.  Plan I will check a CBC and complete metabolic panel today. He did have some elevated white count and mild azotemia while in the hospital and this will be followed up on. I will have him follow up with Dr. Burt Knack who is his surgeon in the office in 3-4 weeks.

## 2015-07-11 NOTE — Patient Instructions (Signed)
Please go to the lab and have blood drawn after you leave our office. Follow up in the [facility] office at scheduled appointment.

## 2015-07-12 LAB — COMPREHENSIVE METABOLIC PANEL
ALK PHOS: 79 IU/L (ref 39–117)
ALT: 33 IU/L (ref 0–44)
AST: 33 IU/L (ref 0–40)
Albumin/Globulin Ratio: 1.3 (ref 1.1–2.5)
Albumin: 2.6 g/dL — ABNORMAL LOW (ref 3.5–4.7)
BUN/Creatinine Ratio: 13 (ref 10–22)
BUN: 17 mg/dL (ref 8–27)
Bilirubin Total: 0.4 mg/dL (ref 0.0–1.2)
CALCIUM: 8.4 mg/dL — AB (ref 8.6–10.2)
CO2: 29 mmol/L (ref 18–29)
CREATININE: 1.32 mg/dL — AB (ref 0.76–1.27)
Chloride: 97 mmol/L (ref 97–108)
GFR calc Af Amer: 55 mL/min/{1.73_m2} — ABNORMAL LOW (ref >59)
GFR, EST NON AFRICAN AMERICAN: 47 mL/min/{1.73_m2} — AB (ref >59)
GLUCOSE: 134 mg/dL — AB (ref 65–99)
Globulin, Total: 2 g/dL (ref 1.5–4.5)
Potassium: 3 mmol/L — ABNORMAL LOW (ref 3.5–5.2)
SODIUM: 141 mmol/L (ref 134–144)
Total Protein: 4.6 g/dL — ABNORMAL LOW (ref 6.0–8.5)

## 2015-07-12 LAB — CBC WITH DIFFERENTIAL/PLATELET
BASOS ABS: 0 10*3/uL (ref 0.0–0.2)
Basos: 0 %
EOS (ABSOLUTE): 0.4 10*3/uL (ref 0.0–0.4)
EOS: 8 %
Hematocrit: 30.7 % — ABNORMAL LOW (ref 37.5–51.0)
Hemoglobin: 10.1 g/dL — ABNORMAL LOW (ref 12.6–17.7)
IMMATURE GRANULOCYTES: 1 %
Immature Grans (Abs): 0 10*3/uL (ref 0.0–0.1)
LYMPHS ABS: 1.6 10*3/uL (ref 0.7–3.1)
Lymphs: 33 %
MCH: 31.4 pg (ref 26.6–33.0)
MCHC: 32.9 g/dL (ref 31.5–35.7)
MCV: 95 fL (ref 79–97)
MONOS ABS: 0.5 10*3/uL (ref 0.1–0.9)
Monocytes: 10 %
NEUTROS PCT: 48 %
Neutrophils Absolute: 2.4 10*3/uL (ref 1.4–7.0)
PLATELETS: 182 10*3/uL (ref 150–379)
RBC: 3.22 x10E6/uL — AB (ref 4.14–5.80)
RDW: 16.3 % — AB (ref 12.3–15.4)
WBC: 4.9 10*3/uL (ref 3.4–10.8)

## 2015-07-13 ENCOUNTER — Telehealth: Payer: Self-pay | Admitting: *Deleted

## 2015-07-13 NOTE — Telephone Encounter (Signed)
As per Dr Algernon Huxley request, patient's skilled nursing facility called and given recent lab results. A copy of the lab results was faxed to [facility] attention to [place].

## 2015-07-25 ENCOUNTER — Emergency Department
Admission: EM | Admit: 2015-07-25 | Discharge: 2015-07-25 | Disposition: A | Discharge status: Home / Self Care | Payer: Medicare Other | Attending: Emergency Medicine | Admitting: Emergency Medicine

## 2015-07-25 DIAGNOSIS — I10 Essential (primary) hypertension: Secondary | ICD-10-CM | POA: Insufficient documentation

## 2015-07-25 DIAGNOSIS — E119 Type 2 diabetes mellitus without complications: Secondary | ICD-10-CM | POA: Diagnosis not present

## 2015-07-25 DIAGNOSIS — R339 Retention of urine, unspecified: Secondary | ICD-10-CM | POA: Diagnosis present

## 2015-07-25 DIAGNOSIS — Z792 Long term (current) use of antibiotics: Secondary | ICD-10-CM | POA: Diagnosis not present

## 2015-07-25 DIAGNOSIS — Z79899 Other long term (current) drug therapy: Secondary | ICD-10-CM | POA: Diagnosis not present

## 2015-07-25 DIAGNOSIS — Y828 Other medical devices associated with adverse incidents: Secondary | ICD-10-CM | POA: Diagnosis not present

## 2015-07-25 DIAGNOSIS — T83028A Displacement of other indwelling urethral catheter, initial encounter: Secondary | ICD-10-CM | POA: Diagnosis not present

## 2015-07-25 DIAGNOSIS — T839XXA Unspecified complication of genitourinary prosthetic device, implant and graft, initial encounter: Secondary | ICD-10-CM

## 2015-07-25 MED ORDER — LOSARTAN POTASSIUM-HCTZ 100-12.5 MG PO TABS: 1.0000 | ORAL_TABLET | Freq: Every day | ORAL | Status: DC

## 2015-07-25 MED ORDER — TAMSULOSIN HCL 0.4 MG PO CAPS: 0.4000 mg | ORAL_CAPSULE | Freq: Every day | ORAL | Status: DC

## 2015-07-25 MED ORDER — APIXABAN 2.5 MG PO TABS: 2.5000 mg | ORAL_TABLET | Freq: Two times a day (BID) | ORAL | Status: DC

## 2015-07-25 MED ORDER — METOPROLOL SUCCINATE ER 200 MG PO TB24: 200.0000 mg | ORAL_TABLET | Freq: Every day | ORAL | Status: DC

## 2015-07-25 NOTE — Discharge Instructions (Signed)
Please seek medical attention for any high fevers, chest pain, shortness of breath, change in behavior, persistent vomiting, bloody stool or any other new or concerning symptoms.  Foley Catheter Care, Adult A Foley catheter is a soft, flexible tube that is placed into the bladder to drain urine. A Foley catheter may be inserted if:  You leak urine or are not able to control when you urinate (urinary incontinence).  You are not able to urinate when you need to (urinary retention).  You had prostate surgery or surgery on the genitals.  You have certain medical conditions, such as multiple sclerosis, dementia, or a spinal cord injury. If you are going home with a Foley catheter in place, follow the instructions below. TAKING CARE OF THE CATHETER 1. Wash your hands with soap and water. 2. Using mild soap and warm water on a clean washcloth:  Clean the area on your body closest to the catheter insertion site using a circular motion, moving away from the catheter. Never wipe toward the catheter because this could sweep bacteria up into the urethra and cause infection.  Remove all traces of soap. Pat the area dry with a clean towel. For males, reposition the foreskin. 3. Attach the catheter to your leg so there is no tension on the catheter. Use adhesive tape or a leg strap. If you are using adhesive tape, remove any sticky residue left behind by the previous tape you used. 4. Keep the drainage bag below the level of the bladder, but keep it off the floor. 5. Check throughout the day to be sure the catheter is working and urine is draining freely. Make sure the tubing does not become kinked. 6. Do not pull on the catheter or try to remove it. Pulling could damage internal tissues. TAKING CARE OF THE DRAINAGE BAGS You will be given two drainage bags to take home. One is a large overnight drainage bag, and the other is a smaller leg bag that fits underneath clothing. You may wear the overnight bag at  any time, but you should never wear the smaller leg bag at night. Follow the instructions below for how to empty, change, and clean your drainage bags. Emptying the Drainage Bag You must empty your drainage bag when it is  - full or at least 2-3 times a day. 1. Wash your hands with soap and water. 2. Keep the drainage bag below your hips, below the level of your bladder. This stops urine from going back into the tubing and into your bladder. 3. Hold the dirty bag over the toilet or a clean container. 4. Open the pour spout at the bottom of the bag and empty the urine into the toilet or container. Do not let the pour spout touch the toilet, container, or any other surface. Doing so can place bacteria on the bag, which can cause an infection. 5. Clean the pour spout with a gauze pad or cotton ball that has rubbing alcohol on it. 6. Close the pour spout. 7. Attach the bag to your leg with adhesive tape or a leg strap. 8. Wash your hands well. Changing the Drainage Bag Change your drainage bag once a month or sooner if it starts to smell bad or look dirty. Below are steps to follow when changing the drainage bag. 1. Wash your hands with soap and water. 2. Pinch off the rubber catheter so that urine does not spill out. 3. Disconnect the catheter tube from the drainage tube at the connection  valve. Do not let the tubes touch any surface. 4. Clean the end of the catheter tube with an alcohol wipe. Use a different alcohol wipe to clean the end of the drainage tube. 5. Connect the catheter tube to the drainage tube of the clean drainage bag. 6. Attach the new bag to the leg with adhesive tape or a leg strap. Avoid attaching the new bag too tightly. 7. Wash your hands well. Cleaning the Drainage Bag 1. Wash your hands with soap and water. 2. Wash the bag in warm, soapy water. 3. Rinse the bag thoroughly with warm water. 4. Fill the bag with a solution of white vinegar and water (1 cup vinegar to 1 qt  warm water [.2 L vinegar to 1 L warm water]). Close the bag and soak it for 30 minutes in the solution. 5. Rinse the bag with warm water. 6. Hang the bag to dry with the pour spout open and hanging downward. 7. Store the clean bag (once it is dry) in a clean plastic bag. 8. Wash your hands well. PREVENTING INFECTION  Wash your hands before and after handling your catheter.  Take showers daily and wash the area where the catheter enters your body. Do not take baths. Replace wet leg straps with dry ones, if this applies.  Do not use powders, sprays, or lotions on the genital area. Only use creams, lotions, or ointments as directed by your caregiver.  For females, wipe from front to back after each bowel movement.  Drink enough fluids to keep your urine clear or pale yellow unless you have a fluid restriction.  Do not let the drainage bag or tubing touch or lie on the floor.  Wear cotton underwear to absorb moisture and to keep your skin drier. SEEK MEDICAL CARE IF:   Your urine is cloudy or smells unusually bad.  Your catheter becomes clogged.  You are not draining urine into the bag or your bladder feels full.  Your catheter starts to leak. SEEK IMMEDIATE MEDICAL CARE IF:   You have pain, swelling, redness, or pus where the catheter enters the body.  You have pain in the abdomen, legs, lower back, or bladder.  You have a fever.  You see blood fill the catheter, or your urine is pink or red.  You have nausea, vomiting, or chills.  Your catheter gets pulled out. MAKE SURE YOU:   Understand these instructions.  Will watch your condition.  Will get help right away if you are not doing well or get worse.   This information is not intended to replace advice given to you by your health care provider. Make sure you discuss any questions you have with your health care provider.   Document Released: 09/29/2005 Document Revised: 02/13/2014 Document Reviewed:  09/20/2012 Elsevier Interactive Patient Education [employer].

## 2015-07-25 NOTE — ED Provider Notes (Signed)
[facility] Emergency Department Provider Note    ____________________________________________  Time seen: 2010  I have reviewed the triage vital signs and the nursing notes.   HISTORY  Chief Complaint Urinary Retention   History limited by: Not Limited   HPI Jesse Macias is a 81 y.o. male who comes in today because of concerns for urinary retention. The patient had a Foley catheter exchange performed today. Since then however he is only had a small amount of bloody urine come out of the catheter. He does feel like he has the urge and sensation to urinate. He does feel little discomfort in the lower abdomen. The patient is on a prescription blood thinner. Patient denies any fevers.    Past Medical History  Diagnosis Date  . Hypertension   . History of hiatal hernia   . Diabetes mellitus without complication ([place])   . Diverticulitis   . Hyperlipemia     Patient Active Problem List   Diagnosis Date Noted  . Diverticulitis of colon without hemorrhage 07/11/2015  . Ileus ([place])   . Perforated diverticulum of large intestine   . Acute diverticulitis 06/02/2015  . Atrial fibrillation, chronic ([place]) 12/06/2014  . Benign hypertension 05/19/2014  . HLD (hyperlipidemia) 05/19/2014    Past Surgical History  Procedure Laterality Date  . Hernia repair      Two  . Back surgery    . Colectomy with colostomy creation/hartmann procedure N/A 06/09/2015    Procedure: COLECTOMY WITH COLOSTOMY CREATION/HARTMANN PROCEDURE;  Surgeon: Florene Glen, MD;  Location: [facility] ORS;  Service: General;  Laterality: N/A;  . Central venous catheter insertion N/A 06/09/2015    Procedure: INSERTION CENTRAL LINE ADULT;  Surgeon: Florene Glen, MD;  Location: [facility] ORS;  Service: General;  Laterality: N/A;    Current Outpatient Rx  Name  Route  Sig  Dispense  Refill  . acetaminophen (TYLENOL) 325 MG tablet   Oral   Take 2 tablets (650 mg total) by mouth every 6 (six)  hours as needed for mild pain.   90 tablet      . apixaban (ELIQUIS) 2.5 MG TABS tablet   Oral   Take 1 tablet (2.5 mg total) by mouth 2 (two) times daily.   60 tablet   1   . brimonidine (ALPHAGAN) 0.2 % ophthalmic solution   Both Eyes   Place 2 drops into both eyes daily.         . dorzolamide (TRUSOPT) 2 % ophthalmic solution   Both Eyes   Place 2 drops into both eyes daily.         . feeding supplement, ENSURE ENLIVE, (ENSURE ENLIVE) LIQD   Oral   Take 237 mLs by mouth 2 (two) times daily before lunch and supper.   237 mL   12   . HYDROcodone-acetaminophen (NORCO/VICODIN) 5-325 MG per tablet   Oral   Take 1-2 tablets by mouth every 6 (six) hours as needed for moderate pain.   30 tablet   0   . latanoprost (XALATAN) 0.005 % ophthalmic solution   Both Eyes   Place 2 drops into both eyes daily.         Marland Kitchen levofloxacin (LEVAQUIN) 500 MG tablet   Oral   Take 500 mg by mouth daily.         Marland Kitchen losartan-hydrochlorothiazide (HYZAAR) 100-12.5 MG per tablet   Oral   Take 1 tablet by mouth daily.         Marland Kitchen  ondansetron (ZOFRAN-ODT) 4 MG disintegrating tablet   Oral   Take 1 tablet (4 mg total) by mouth every 6 (six) hours as needed for nausea.   20 tablet   0   . tamsulosin (FLOMAX) 0.4 MG CAPS capsule   Oral   Take 1 capsule (0.4 mg total) by mouth daily after supper.   30 capsule   6   . timolol (TIMOPTIC) 0.25 % ophthalmic solution   Both Eyes   Place 2 drops into both eyes daily.         . TOPROL XL 200 MG 24 hr tablet   Oral   Take 1 tablet by mouth daily.           Dispense as written.   . vitamin B-12 (CYANOCOBALAMIN) 500 MCG tablet   Oral   Take by mouth.           Allergies Review of patient's allergies indicates no known allergies.  Family History  Problem Relation Age of Onset  . Diabetes Mother   . Diverticulitis Mother     Social History Social History  Substance Use Topics  . Smoking status: Never Smoker   .  Smokeless tobacco: Never Used  . Alcohol Use: No    Review of Systems  Constitutional: Negative for fever. Cardiovascular: Negative for chest pain. Respiratory: Negative for shortness of breath. Gastrointestinal: Positive for lower abdominal pain. Genitourinary: Negative for dysuria. Musculoskeletal: Negative for back pain. Skin: Negative for rash. Neurological: Negative for headaches, focal weakness or numbness.  10-point ROS otherwise negative.  ____________________________________________   PHYSICAL EXAM:  VITAL SIGNS: ED Triage Vitals  Enc Vitals Group     BP 07/25/15 1729 175/98 mmHg     Pulse Rate 07/25/15 1729 79     Resp 07/25/15 1729 18     Temp 07/25/15 1729 97.9 F (36.6 C)     Temp Source 07/25/15 1729 Oral     SpO2 07/25/15 1729 98 %     Weight 07/25/15 1729 128 lb (58.06 kg)     Height 07/25/15 1729 5\' 5"  (1.651 m)   Constitutional: Alert and oriented. Well appearing and in no distress. Eyes: Conjunctivae are normal. PERRL. Normal extraocular movements. ENT   Head: Normocephalic and atraumatic.   Nose: No congestion/rhinnorhea.   Mouth/Throat: Mucous membranes are moist.   Neck: No stridor. Hematological/Lymphatic/Immunilogical: No cervical lymphadenopathy. Cardiovascular: Normal rate, regular rhythm.  No murmurs, rubs, or gallops. Respiratory: Normal respiratory effort without tachypnea nor retractions. Breath sounds are clear and equal bilaterally. No wheezes/rales/rhonchi. Gastrointestinal: Mild tenderness to palpation in the suprapubic region. Genitourinary: Foley catheter in place. Some bloody urine noted in the proximal portion. Musculoskeletal: Normal range of motion in all extremities. No joint effusions.  No lower extremity tenderness nor edema. Neurologic:  Normal speech and language. No gross focal neurologic deficits are appreciated. Speech is normal.  Skin:  Skin is warm, dry and intact. No rash noted. Psychiatric: Mood and  affect are normal. Speech and behavior are normal. Patient exhibits appropriate insight and judgment.  ____________________________________________    LABS (pertinent positives/negatives)  None  ____________________________________________   EKG  None  ____________________________________________    RADIOLOGY  None   ____________________________________________   PROCEDURES  Procedure(s) performed: None  Critical Care performed: No  ____________________________________________   INITIAL IMPRESSION / ASSESSMENT AND PLAN / ED COURSE  Pertinent labs & imaging results that were available during my care of the patient were reviewed by me and considered in my medical decision  making (see chart for details).  Patient presented to the emergency department today with concerns for nondraining Foley catheter. He had the replacement done today. The Foley was attempted be flushed here in the emergency department that was unsuccessful. A new Foley was placed and 2 blood clots did come out. It then started to drain nonbloody urine. Will discharge patient home.  ____________________________________________   FINAL CLINICAL IMPRESSION(S) / ED DIAGNOSES  Final diagnoses:  Foley catheter problem, initial encounter [facility])     Nance Pear, MD 07/25/15 2119

## 2015-07-25 NOTE — ED Notes (Signed)
Patient with history of diverticulitis requiring colon resection and colostomy.  Post operatively patient had complications with urinary retention requiring long term foley catheter for the past six months.  Today patient reports that home health nurse came out and changed foley catheter at 15:30. Since foley changed patient has minimal urine output that is bloody.

## 2015-07-26 ENCOUNTER — Encounter: Payer: Self-pay | Admitting: *Deleted

## 2015-07-30 ENCOUNTER — Ambulatory Visit (INDEPENDENT_AMBULATORY_CARE_PROVIDER_SITE_OTHER): Payer: Medicare Other | Admitting: Surgery

## 2015-07-30 ENCOUNTER — Encounter: Payer: Self-pay | Admitting: Surgery

## 2015-07-30 ENCOUNTER — Other Ambulatory Visit: Payer: Self-pay | Admitting: *Deleted

## 2015-07-30 ENCOUNTER — Encounter (INDEPENDENT_AMBULATORY_CARE_PROVIDER_SITE_OTHER): Payer: Self-pay

## 2015-07-30 VITALS — BP 117/73 | HR 81 | Temp 97.7°F | Ht 65.0 in | Wt 126.0 lb

## 2015-07-30 DIAGNOSIS — K5732 Diverticulitis of large intestine without perforation or abscess without bleeding: Secondary | ICD-10-CM

## 2015-07-30 DIAGNOSIS — N390 Urinary tract infection, site not specified: Secondary | ICD-10-CM

## 2015-07-30 NOTE — Patient Instructions (Signed)
Go today to the lab for the collection of a urine sample to test for a UTI. Follow up on 08/22/15 at 1:30 PM in the [place] office.

## 2015-07-30 NOTE — Progress Notes (Signed)
Outpatient postop visit  07/30/2015  Jesse Macias is an 81 y.o. male.    Procedure:  Hartman's procedure for perforated diverticulitis  CC:  HPI:  This patient who is status post Hartman's procedure for perforated diverticulitis with a prolonged in-hospital course followed by a rehabilitation facility. He had experienced  acute on chronic renal failure an severe  Malnutrition.   He has had problems with ostomy bag fit  And has had a Foley catheter placed for retention and leakage.  He is eating better but is not taking much supplementation.  Medications reviewed.    Physical Exam:  BP 117/73 mmHg  Pulse 81  Temp(Src) 97.7 F (36.5 C) (Oral)  Ht 5\' 5"  (1.651 m)  Wt 126 lb (57.153 kg)  BMI 20.97 kg/m2    PE:  Cachectic elderly male.  Abdomen is soft nontender wounds healed without erythema or drainage ostomy is functional without problems.  Foley catheter is pleasant the urine in the bag is adequate in volume but is somewhat cloudy  Calves are nontender    Assessment/Plan:   elderly week male patient following Hartman's procedure for perforated diverticulitis. He seems to be doing fairly well but has required an indwelling Foley catheter for retention and leakage he has an appointment with his urologist next week.  I will obtain outpatient consultation with a colostomy nurse to assist in bag management at this point.  He can follow-up with [place] in 3 weeks. He is such a poor candidate for subsequent colostomy closure with a low albumin and malnutrition that I would be very hesitant to close his colostomy at this point.  Florene Glen, MD, FACS

## 2015-07-31 ENCOUNTER — Other Ambulatory Visit: Payer: Self-pay | Admitting: *Deleted

## 2015-07-31 ENCOUNTER — Inpatient Hospital Stay: Payer: Medicare Other

## 2015-07-31 ENCOUNTER — Inpatient Hospital Stay: Payer: Medicare Other | Attending: Internal Medicine | Admitting: Internal Medicine

## 2015-07-31 ENCOUNTER — Encounter: Payer: Self-pay | Admitting: Internal Medicine

## 2015-07-31 VITALS — BP 155/74 | HR 68 | Temp 97.2°F | Resp 18 | Ht 65.0 in | Wt 126.1 lb

## 2015-07-31 DIAGNOSIS — I129 Hypertensive chronic kidney disease with stage 1 through stage 4 chronic kidney disease, or unspecified chronic kidney disease: Secondary | ICD-10-CM

## 2015-07-31 DIAGNOSIS — N390 Urinary tract infection, site not specified: Secondary | ICD-10-CM

## 2015-07-31 DIAGNOSIS — N183 Chronic kidney disease, stage 3 (moderate): Secondary | ICD-10-CM

## 2015-07-31 DIAGNOSIS — D631 Anemia in chronic kidney disease: Secondary | ICD-10-CM | POA: Insufficient documentation

## 2015-07-31 DIAGNOSIS — Z79899 Other long term (current) drug therapy: Secondary | ICD-10-CM | POA: Diagnosis not present

## 2015-07-31 DIAGNOSIS — E119 Type 2 diabetes mellitus without complications: Secondary | ICD-10-CM

## 2015-07-31 DIAGNOSIS — D472 Monoclonal gammopathy: Secondary | ICD-10-CM | POA: Insufficient documentation

## 2015-07-31 MED ORDER — SULFAMETHOXAZOLE-TRIMETHOPRIM 800-160 MG PO TABS: 1.0000 | ORAL_TABLET | Freq: Two times a day (BID) | ORAL | Status: DC

## 2015-07-31 NOTE — Progress Notes (Signed)
Pt was put in hospital in august and had to have colostomy for diverticulitis/ileus. Then he was sent to [facility] and now home for 2 weeks. Pt has stage III kidney disease and has f/c in place and has had it since being in hospital and it has been changed out once. He is here for abnormal protein on blood work.  Pt's wife did say she got a call today about pt has UTI and atb called in but they have not went to pick it up yet.

## 2015-07-31 NOTE — Progress Notes (Signed)
[place] CONSULT NOTE  Patient Care Team: No Pcp Per Patient as PCP - General (General Practice)  CHIEF COMPLAINTS/PURPOSE OF CONSULTATION:  Abnormal SPEP  ONCOLOGIC HISTORY:  # SEP 2016- M PROTEIN [0.2gm/dl]  # ANEMIA [hb ~9]; CKD [stage III ]  HISTORY OF PRESENTING ILLNESS:  Jesse Macias 81 y.o.  male with multiple medical problems including chronic kidney disease [not on dialysis] was recently admitted to the hospital for "fluid overload" and also needed a colostomy for bowel obstruction. He also has urinary obstruction- currently is on a Foley catheter.   Given this chronic kidney disease- M protein was checked by nephrology. He has been referred to [place] for further evaluation and workup.  Given his recent illness/prolonged admission the hospital- patient has had some weight loss; otherwise he is gaining weight at this time. He had significant improvement in his performance status since being in the rehabilitation. He is currently home for the last 2 weeks. He is walking with a rolling walker.  ROS: Chronic shortness of breath. No new cough. Swelling in the legs improved. He does complain of fatigue/stable. A complete 10 point review of system is done which is negative except mentioned above in history of present illness  MEDICAL HISTORY:  Past Medical History  Diagnosis Date  . Hypertension   . History of hiatal hernia   . Diabetes mellitus without complication ([place])   . Diverticulitis   . Hyperlipemia   . Chronic kidney disease     acute renal failure, chronic kidney disease stage III  . Anemia     anemia of chronic renal disease  . Neuromuscular disorder ([place])     peripheral neuropathy  . Glaucoma     SURGICAL HISTORY: Past Surgical History  Procedure Laterality Date  . Hernia repair      Two  . Back surgery    . Colectomy with colostomy creation/hartmann procedure N/A 06/09/2015    Procedure: COLECTOMY WITH COLOSTOMY CREATION/HARTMANN PROCEDURE;   Surgeon: Florene Glen, MD;  Location: [facility] ORS;  Service: General;  Laterality: N/A;  . Central venous catheter insertion N/A 06/09/2015    Procedure: INSERTION CENTRAL LINE ADULT;  Surgeon: Florene Glen, MD;  Location: [facility] ORS;  Service: General;  Laterality: N/A;    SOCIAL HISTORY: Social History   Social History  . Marital Status: Married    Spouse Name: N/A  . Number of Children: N/A  . Years of Education: N/A   Occupational History  . Not on file.   Social History Main Topics  . Smoking status: Never Smoker   . Smokeless tobacco: Never Used  . Alcohol Use: No     Comment: quit in 1961  . Drug Use: No  . Sexual Activity: Not on file   Other Topics Concern  . Not on file   Social History Narrative    FAMILY HISTORY: Family History  Problem Relation Age of Onset  . Diabetes Mother   . Diverticulitis Mother     ALLERGIES:  has No Known Allergies.  MEDICATIONS:  Current Outpatient Prescriptions  Medication Sig Dispense Refill  . apixaban (ELIQUIS) 2.5 MG TABS tablet Take 1 tablet (2.5 mg total) by mouth 2 (two) times daily. 60 tablet 0  . brimonidine (ALPHAGAN) 0.2 % ophthalmic solution Place 2 drops into both eyes daily.    . dorzolamide (TRUSOPT) 2 % ophthalmic solution Place 2 drops into both eyes daily.    Marland Kitchen HYDROcodone-acetaminophen (NORCO/VICODIN) 5-325 MG per tablet Take 1-2  tablets by mouth every 6 (six) hours as needed for moderate pain. 30 tablet 0  . latanoprost (XALATAN) 0.005 % ophthalmic solution Place 2 drops into both eyes daily.    Marland Kitchen losartan-hydrochlorothiazide (HYZAAR) 100-12.5 MG tablet Take 1 tablet by mouth daily. 30 tablet 0  . metoprolol (TOPROL XL) 200 MG 24 hr tablet Take 1 tablet (200 mg total) by mouth daily. 30 tablet 11  . ondansetron (ZOFRAN-ODT) 4 MG disintegrating tablet Take 1 tablet (4 mg total) by mouth every 6 (six) hours as needed for nausea. 20 tablet 0  . tamsulosin (FLOMAX) 0.4 MG CAPS capsule Take 1 capsule (0.4 mg  total) by mouth daily after supper. 30 capsule 0  . timolol (TIMOPTIC) 0.25 % ophthalmic solution Place 2 drops into both eyes daily.     No current facility-administered medications for this visit.      Marland Kitchen  PHYSICAL EXAMINATION: ECOG PERFORMANCE STATUS: 3 - Symptomatic, >50% confined to bed  Filed Vitals:   07/31/15 1143  BP: 155/74  Pulse: 68  Temp: 97.2 F (36.2 C)  Resp: 18   Filed Weights   07/31/15 1143  Weight: 126 lb 1.7 oz (57.2 kg)    GENERAL: Thin build cachectic-appearing male patient accompanied by his wife. Alert, no distress and comfortable.   Walking with a rolling walker. EYES: no pallor or icterus OROPHARYNX: Poor dentition NECK: supple, no masses felt LYMPH:  no palpable lymphadenopathy in the cervical, axillary or inguinal regions LUNGS: Positive for crackles bilateral lung bases. HEART/CVS: regular rate & rhythm and no murmurs; No lower extremity edema ABDOMEN: abdomen soft, non-tender and normal bowel sounds; colostomy bag. Patient has Foley catheter. Musculoskeletal:no cyanosis of digits and no clubbing  PSYCH: alert & oriented x 3 with fluent speech NEURO: no focal motor/sensory deficits SKIN:  no rashes or significant lesions  LABORATORY DATA:  I have reviewed the data as listed Lab Results  Component Value Date   WBC 4.9 07/11/2015   HGB 8.6* 06/25/2015   HCT 30.7* 07/11/2015   MCV 95.4 06/25/2015   PLT 256 06/25/2015    Recent Labs  06/10/15 0552  06/22/15 0429  06/25/15 0319 06/26/15 0410 06/27/15 0520 07/11/15 0953  NA 136  < > 138  --  137 136 140 141  K 3.5  < > 3.8  --  3.7 3.3* 3.2* 3.0*  CL 101  < > 108  --  111 111 115* 97  CO2 28  < > 25  --  21* 22 22 29   GLUCOSE 199*  < > 112*  --  85 102* 106* 134*  BUN 28*  < > 61*  --  60* 56* 48* 17  CREATININE 1.42*  < > 1.48*  < > 2.42* 2.40* 1.75* 1.32*  CALCIUM 8.9  < > 7.9*  --  7.6* 7.5* 7.6* 8.4*  GFRNONAA 42*  < > 40*  < > 22* 22* 33* 47*  GFRAA 49*  < > 47*  < > 26*  26* 38* 55*  PROT 5.1*  --  4.0*  --   --   --   --  4.6*  ALBUMIN 2.4*  --  1.9*  --  1.9*  --   --  2.6*  AST 23  --  29  --   --   --   --  33  ALT 14*  --  56  --   --   --   --  33  ALKPHOS 46  --  80  --   --   --   --  79  BILITOT 1.5*  --  1.3*  --   --   --   --  0.4  < > = values in this interval not displayed.  RADIOGRAPHIC STUDIES: I have personally reviewed the radiological images as listed and agreed with the findings in the report. No results found.  ASSESSMENT & PLAN:   81 year old male patient with multiple other problems with abnormal M protein 0.2 g/dL. Recommend checking serum free light chain ratio. 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 Clinically I think patients mild anemia/mild CKD- is unlikely related to slightly elevated M protein. Patient does not have hypercalcemia or any symptoms related to bone lesions.  Especially in the context of multiple medical problems/poor performance status- I would recommend against any aggressive workup at this time.  I discussed the above with the patient and his wife in detail. They agree with the plan.   I would recommend follow-up monoclonal workup in 6 months.   .Thank you Dr.Lateef for allowing me to participate in the care of your pleasant patient. Please do not hesitate to contact me with questions or concerns in the interim.  All questions were answered. The patient knows to call the clinic with any problems, questions or concerns.   I spent 15 minutes counseling the patient face to face. The total time spent in the appointment was 30 minutes and more than 50% was on counseling.     Cammie Sickle, MD 07/31/2015 11:59 AM

## 2015-08-01 ENCOUNTER — Encounter: Payer: Self-pay | Admitting: Obstetrics and Gynecology

## 2015-08-01 ENCOUNTER — Ambulatory Visit (INDEPENDENT_AMBULATORY_CARE_PROVIDER_SITE_OTHER): Payer: Medicare Other | Admitting: Obstetrics and Gynecology

## 2015-08-01 VITALS — BP 110/73 | HR 67 | Resp 16 | Ht 65.0 in | Wt 125.9 lb

## 2015-08-01 DIAGNOSIS — R339 Retention of urine, unspecified: Secondary | ICD-10-CM | POA: Diagnosis not present

## 2015-08-01 DIAGNOSIS — N400 Enlarged prostate without lower urinary tract symptoms: Secondary | ICD-10-CM | POA: Diagnosis not present

## 2015-08-01 LAB — KAPPA/LAMBDA LIGHT CHAINS
Kappa free light chain: 54.42 mg/L — ABNORMAL HIGH (ref 3.30–19.40)
Kappa, lambda light chain ratio: 1.02 (ref 0.26–1.65)
Lambda free light chains: 53.23 mg/L — ABNORMAL HIGH (ref 5.71–26.30)

## 2015-08-01 MED ORDER — FINASTERIDE 5 MG PO TABS: 5.0000 mg | ORAL_TABLET | Freq: Every day | ORAL | Status: DC

## 2015-08-01 NOTE — Progress Notes (Signed)
08/01/2015 9:10 PM   Jesse Macias [birth year] [id number]  Referring provider: No referring provider defined for this encounter.  Chief Complaint  Patient presents with  . Urinary Retention  . Establish Care    HPI: Patient is an 81 year old male presenting today for follow-up after recent admission for micro-bowel perforation and surgery for bowel resection for postoperative urinary retention.   Baseline urinary symptoms include frequency, weak stream intermittency prior to surgery.  Never treated previously for BPH.  He is currently on daily tamsulosin that he is unsure if this was started during his recent admission or if he was previously taking it.  He was seen early this week by his GI doctor and was told he had a urinary tract infection. His family member states that he has just started on a 10 day course of antibiotics but she is unsure which kind.  PMH: Past Medical History  Diagnosis Date  . Hypertension   . History of hiatal hernia   . Diabetes mellitus without complication ([place])   . Diverticulitis   . Hyperlipemia   . Chronic kidney disease     acute renal failure, chronic kidney disease stage III  . Anemia     anemia of chronic renal disease  . Neuromuscular disorder ([place])     peripheral neuropathy  . Glaucoma     Surgical History: Past Surgical History  Procedure Laterality Date  . Hernia repair      Two  . Back surgery    . Colectomy with colostomy creation/hartmann procedure N/A 06/09/2015    Procedure: COLECTOMY WITH COLOSTOMY CREATION/HARTMANN PROCEDURE;  Surgeon: Florene Glen, MD;  Location: [facility] ORS;  Service: General;  Laterality: N/A;  . Central venous catheter insertion N/A 06/09/2015    Procedure: INSERTION CENTRAL LINE ADULT;  Surgeon: Florene Glen, MD;  Location: [facility] ORS;  Service: General;  Laterality: N/A;    Home Medications:    Medication List       This list is accurate as of: 08/01/15 11:59 PM.  Always use your most  recent med list.               apixaban 2.5 MG Tabs tablet  Commonly known as:  ELIQUIS  Take 1 tablet (2.5 mg total) by mouth 2 (two) times daily.     brimonidine 0.2 % ophthalmic solution  Commonly known as:  ALPHAGAN  Place 2 drops into both eyes daily.     dorzolamide 2 % ophthalmic solution  Commonly known as:  TRUSOPT  Place 2 drops into both eyes daily.     finasteride 5 MG tablet  Commonly known as:  PROSCAR  Take 1 tablet (5 mg total) by mouth daily.     HYDROcodone-acetaminophen 5-325 MG tablet  Commonly known as:  NORCO/VICODIN  Take 1-2 tablets by mouth every 6 (six) hours as needed for moderate pain.     latanoprost 0.005 % ophthalmic solution  Commonly known as:  XALATAN  Place 2 drops into both eyes daily.     losartan-hydrochlorothiazide 100-12.5 MG tablet  Commonly known as:  HYZAAR  Take 1 tablet by mouth daily.     metoprolol 200 MG 24 hr tablet  Commonly known as:  TOPROL XL  Take 1 tablet (200 mg total) by mouth daily.     ondansetron 4 MG disintegrating tablet  Commonly known as:  ZOFRAN-ODT  Take 1 tablet (4 mg total) by mouth every 6 (six) hours as needed for nausea.  tamsulosin 0.4 MG Caps capsule  Commonly known as:  FLOMAX  Take 1 capsule (0.4 mg total) by mouth daily after supper.     timolol 0.25 % ophthalmic solution  Commonly known as:  TIMOPTIC  Place 2 drops into both eyes daily.        Allergies: No Known Allergies  Family History: Family History  Problem Relation Age of Onset  . Diabetes Mother   . Diverticulitis Mother     Social History:  reports that he has never smoked. He has never used smokeless tobacco. He reports that he does not drink alcohol or use illicit drugs.  ROS: UROLOGY Frequent Urination?: No Hard to postpone urination?: No Burning/pain with urination?: No Get up at night to urinate?: No Leakage of urine?: No Urine stream starts and stops?: No Trouble starting stream?: No Do you have to  strain to urinate?: No Blood in urine?: Yes Urinary tract infection?: Yes Sexually transmitted disease?: No Injury to kidneys or bladder?: No Painful intercourse?: No Weak stream?: No Erection problems?: No Penile pain?: No  Gastrointestinal Nausea?: No Vomiting?: No Indigestion/heartburn?: No Diarrhea?: No Constipation?: No  Constitutional Fever: No Night sweats?: No Weight loss?: Yes Fatigue?: Yes  Skin Skin rash/lesions?: No Itching?: No  Eyes Blurred vision?: Yes Double vision?: No  Ears/Nose/Throat Sore throat?: No Sinus problems?: No  Hematologic/Lymphatic Swollen glands?: No Easy bruising?: No  Cardiovascular Leg swelling?: No Chest pain?: No  Respiratory Cough?: No Shortness of breath?: No  Endocrine Excessive thirst?: No  Musculoskeletal Back pain?: No Joint pain?: No  Neurological Headaches?: No Dizziness?: No  Psychologic Depression?: No Anxiety?: No  Physical Exam: BP 110/73 mmHg  Pulse 67  Resp 16  Ht 5\' 5"  (1.651 m)  Wt 125 lb 14.4 oz (57.108 kg)  BMI 20.95 kg/m2  Constitutional:  Alert and oriented, No acute distress. HEENT: [place] AT, moist mucus membranes.  Trachea midline, no masses. Cardiovascular: No clubbing, cyanosis, or edema. Respiratory: Normal respiratory effort, no increased work of breathing. GI: Abdomen is soft, nontender, nondistended, no abdominal masses GU: No CVA tenderness. DRE: +4 prostate, smooth, non tender Skin: No rashes, bruises or suspicious lesions. Lymph: No cervical or inguinal adenopathy. Neurologic: Grossly intact, no focal deficits, moving all 4 extremities. Psychiatric: Normal mood and affect.  Laboratory Data: Lab Results  Component Value Date   WBC 4.9 07/11/2015   HGB 8.6* 06/25/2015   HCT 30.7* 07/11/2015   MCV 95.4 06/25/2015   PLT 256 06/25/2015    Lab Results  Component Value Date   CREATININE 1.32* 07/11/2015    No results found for: PSA  No results found for:  TESTOSTERONE  No results found for: HGBA1C  Urinalysis    Component Value Date/Time   COLORURINE YELLOW* 06/23/2015 0947   APPEARANCEUR CLEAR* 06/23/2015 0947   LABSPEC 1.018 06/23/2015 0947   PHURINE 6.0 06/23/2015 [place] 06/23/2015 [place] 06/23/2015 [place] 06/23/2015 [place] 06/23/2015 0947   PROTEINUR NEGATIVE 06/23/2015 0947   NITRITE NEGATIVE 06/23/2015 0947   LEUKOCYTESUR TRACE* 06/23/2015 0947    Pertinent Imaging:  Assessment & Plan:    1. Urinary retention- Acute postoperative urinary retention related to BPH. Patient currently being treated for urinary tract infection. We will have him return for voiding trial 5 days. - Urinalysis, Complete - BLADDER SCAN AMB NON-IMAGING  2. BPH- +4 size prostate on DRE. We will continue flomax and add Finasteride.  Return for 5 days nurse visit voiding trial early am appt.; 3 months f/u with me.  Herbert Moors, [facility] [address], [place] [employer], [place] [address] [phone]

## 2015-08-03 DIAGNOSIS — I779 Disorder of arteries and arterioles, unspecified: Secondary | ICD-10-CM | POA: Insufficient documentation

## 2015-08-03 DIAGNOSIS — I7409 Other arterial embolism and thrombosis of abdominal aorta: Secondary | ICD-10-CM | POA: Insufficient documentation

## 2015-08-07 ENCOUNTER — Ambulatory Visit: Payer: Medicare Other | Admitting: Urology

## 2015-08-07 ENCOUNTER — Ambulatory Visit (INDEPENDENT_AMBULATORY_CARE_PROVIDER_SITE_OTHER): Payer: Medicare Other

## 2015-08-07 DIAGNOSIS — R339 Retention of urine, unspecified: Secondary | ICD-10-CM | POA: Diagnosis not present

## 2015-08-07 LAB — BLADDER SCAN AMB NON-IMAGING: SCAN RESULT: 30

## 2015-08-07 NOTE — Progress Notes (Signed)
Bladder Scan: 30 Patient can void Performed By: Toniann Fail, LPN   Pt will f/u with Ria Comment in a month for retention. Reinforced with pt should he develop pain or the inability to urinate prior to appt to give [place] a call. Wife voiced understanding.

## 2015-08-07 NOTE — Progress Notes (Signed)
Fill and Pull Catheter Removal  Patient is present today for a catheter removal.  Patient was cleaned and prepped in a sterile fashion 56ml of sterile water/ saline was instilled into the bladder when the patient felt extreme pain. 15ml of water was then drained from the balloon.  A 22FR foley cath was removed from the bladder complications were noted as: patient felt extreme pain and requested nurse stop pushing in sterile water. .  Patient as then given some time to void on their own.  Patient cannot void but did have a bladder spasm and release all sterile water than was instill into the bladder.  Preformed by: Toniann Fail, LPN  Follow up/ Additional notes: Due to the pt not being able to tolerate fill and spill, pt was advised to go home, drink lots of water, and RTC today at 3:00. Nurse reinforced with pt should he develop any pain and inability to urinate before 3:00 to call and come back to the office. Pt and wife voiced understanding.

## 2015-08-09 ENCOUNTER — Encounter: Payer: Self-pay | Admitting: *Deleted

## 2015-08-09 ENCOUNTER — Ambulatory Visit (INDEPENDENT_AMBULATORY_CARE_PROVIDER_SITE_OTHER): Payer: Medicare Other | Admitting: Obstetrics and Gynecology

## 2015-08-09 ENCOUNTER — Encounter: Payer: Self-pay | Admitting: Obstetrics and Gynecology

## 2015-08-09 VITALS — BP 100/67 | HR 62 | Resp 18 | Ht 65.0 in | Wt 124.4 lb

## 2015-08-09 DIAGNOSIS — R300 Dysuria: Secondary | ICD-10-CM | POA: Diagnosis not present

## 2015-08-09 LAB — MICROSCOPIC EXAMINATION
EPITHELIAL CELLS (NON RENAL): NONE SEEN /HPF (ref 0–10)
RENAL EPITHEL UA: NONE SEEN /HPF

## 2015-08-09 LAB — URINALYSIS, COMPLETE
Bilirubin, UA: NEGATIVE
Glucose, UA: NEGATIVE
KETONES UA: NEGATIVE
NITRITE UA: NEGATIVE
Protein, UA: NEGATIVE
SPEC GRAV UA: 1.01 (ref 1.005–1.030)
Urobilinogen, Ur: 0.2 mg/dL (ref 0.2–1.0)
pH, UA: 5 (ref 5.0–7.5)

## 2015-08-09 LAB — BLADDER SCAN AMB NON-IMAGING: Scan Result: 103

## 2015-08-09 NOTE — Progress Notes (Signed)
08/09/2015 1:27 PM   Sarita Bottom 1924/01/14 [id number]  Referring provider: No referring provider defined for this encounter.  Chief Complaint  Patient presents with  . Dysuria    UTI symptoms    HPI: Patient is a 81 year old male with a history of BPH currently managed with Flomax and finasteride presenting today with complaints of penile pain with urination. He denies significant burning sensations. No fevers or length pain. He is incontinent at baseline.  Foley catheter was removed 2 days ago status post acute episode of post operative urinary retention. He denies suprapubic pressure or pain.  Previous Complaint History: Patient is an 81 year old male presenting today for follow-up after recent admission for micro-bowel perforation and surgery for bowel resection for postoperative urinary retention.   Baseline urinary symptoms include frequency, weak stream intermittency prior to surgery. Never treated previously for BPH. He is currently on daily tamsulosin that he is unsure if this was started during his recent admission or if he was previously taking it.  He was seen early this week by his GI doctor and was told he had a urinary tract infection. His family member states that he has just started on a 10 day course of antibiotics but she is unsure which kind.   PMH: Past Medical History  Diagnosis Date  . Hypertension   . History of hiatal hernia   . Diabetes mellitus without complication ([place])   . Diverticulitis   . Hyperlipemia   . Chronic kidney disease     acute renal failure, chronic kidney disease stage III  . Anemia     anemia of chronic renal disease  . Neuromuscular disorder ([place])     peripheral neuropathy  . Glaucoma     Surgical History: Past Surgical History  Procedure Laterality Date  . Hernia repair      Two  . Back surgery    . Colectomy with colostomy creation/hartmann procedure N/A 06/09/2015    Procedure: COLECTOMY WITH COLOSTOMY  CREATION/HARTMANN PROCEDURE;  Surgeon: Florene Glen, MD;  Location: [facility] ORS;  Service: General;  Laterality: N/A;  . Central venous catheter insertion N/A 06/09/2015    Procedure: INSERTION CENTRAL LINE ADULT;  Surgeon: Florene Glen, MD;  Location: [facility] ORS;  Service: General;  Laterality: N/A;    Home Medications:    Medication List       This list is accurate as of: 08/09/15  1:27 PM.  Always use your most recent med list.               apixaban 2.5 MG Tabs tablet  Commonly known as:  ELIQUIS  Take 1 tablet (2.5 mg total) by mouth 2 (two) times daily.     brimonidine 0.2 % ophthalmic solution  Commonly known as:  ALPHAGAN  Place 2 drops into both eyes daily.     dorzolamide 2 % ophthalmic solution  Commonly known as:  TRUSOPT  Place 2 drops into both eyes daily.     finasteride 5 MG tablet  Commonly known as:  PROSCAR  Take 1 tablet (5 mg total) by mouth daily.     latanoprost 0.005 % ophthalmic solution  Commonly known as:  XALATAN  Place 2 drops into both eyes daily.     losartan-hydrochlorothiazide 100-12.5 MG tablet  Commonly known as:  HYZAAR  Take 1 tablet by mouth daily.     metoprolol 200 MG 24 hr tablet  Commonly known as:  TOPROL XL  Take 1 tablet (200 mg total) by  mouth daily.     sulfamethoxazole-trimethoprim 800-160 MG tablet  Commonly known as:  BACTRIM DS,SEPTRA DS     tamsulosin 0.4 MG Caps capsule  Commonly known as:  FLOMAX  Take 1 capsule (0.4 mg total) by mouth daily after supper.     timolol 0.25 % ophthalmic solution  Commonly known as:  TIMOPTIC  Place 2 drops into both eyes daily.        Allergies: No Known Allergies  Family History: Family History  Problem Relation Age of Onset  . Diabetes Mother   . Diverticulitis Mother     Social History:  reports that he has never smoked. He has never used smokeless tobacco. He reports that he does not drink alcohol or use illicit drugs.  ROS: UROLOGY Frequent Urination?:  No Hard to postpone urination?: Yes Burning/pain with urination?: Yes Get up at night to urinate?: No Leakage of urine?: No Urine stream starts and stops?: Yes Trouble starting stream?: Yes Do you have to strain to urinate?: No Blood in urine?: Yes Urinary tract infection?: No Sexually transmitted disease?: No Injury to kidneys or bladder?: No Painful intercourse?: No Weak stream?: No Erection problems?: No Penile pain?: No  Gastrointestinal Nausea?: No Vomiting?: No Indigestion/heartburn?: No Diarrhea?: No Constipation?: No  Constitutional Fever: No Night sweats?: No Weight loss?: No Fatigue?: No  Skin Skin rash/lesions?: No Itching?: No  Eyes Blurred vision?: No Double vision?: No  Ears/Nose/Throat Sore throat?: No Sinus problems?: No  Hematologic/Lymphatic Swollen glands?: No Easy bruising?: No  Cardiovascular Leg swelling?: No Chest pain?: No  Respiratory Cough?: No Shortness of breath?: No  Endocrine Excessive thirst?: No  Musculoskeletal Back pain?: No Joint pain?: No  Neurological Headaches?: No Dizziness?: No  Psychologic Depression?: No Anxiety?: No  Physical Exam: BP 100/67 mmHg  Pulse 62  Resp 18  Ht 5\' 5"  (1.651 m)  Wt 124 lb 6.4 oz (56.427 kg)  BMI 20.70 kg/m2  Constitutional:  Alert and oriented, No acute distress. HEENT: [place] AT, moist mucus membranes.  Trachea midline, no masses. Cardiovascular: No clubbing, cyanosis, or edema. Respiratory: Normal respiratory effort, no increased work of breathing. GI: Abdomen is soft, nontender, nondistended, no abdominal masses, coloscopy bag present GU: No CVA tenderness.  Skin: No rashes, bruises or suspicious lesions. Lymph: No cervical or inguinal adenopathy. Neurologic: Grossly intact, no focal deficits, moving all 4 extremities. Psychiatric: Normal mood and affect.  Laboratory Data:   Urinalysis    Component Value Date/Time   COLORURINE YELLOW* 06/23/2015 0947    APPEARANCEUR CLEAR* 06/23/2015 0947   LABSPEC 1.018 06/23/2015 0947   PHURINE 6.0 06/23/2015 [place] 06/23/2015 0947   HGBUR NEGATIVE 06/23/2015 [place] 06/23/2015 [place] 06/23/2015 0947   PROTEINUR NEGATIVE 06/23/2015 0947   NITRITE NEGATIVE 06/23/2015 0947   LEUKOCYTESUR TRACE* 06/23/2015 0947    Pertinent Imaging:   Assessment & Plan:    1. Dysuria- UA- WBCs 6-10, RBC 0-2, few- bacteria.  Specimen sent for culture.   Provided samples of Urogesic Blue. Patient will call for prescription if helpful. Will treat pending cultures.  - Urinalysis, Complete  No Follow-up on file.  These notes generated with voice recognition software. I apologize for typographical errors.  Herbert Moors, [facility] [address] [place],  [address] [phone]

## 2015-08-09 NOTE — Progress Notes (Signed)
In and Out Catheterization  Patient is present today for a I & O catheterization due to urinary retention. Patient was cleaned and prepped in a sterile fashion with betadine and Lidocaine 2% jelly was instilled into the urethra.  A 14FR cath was inserted no complications were noted , 210 ml of urine return was noted, urine was yellow in color. A clean urine sample was collected for culture. Bladder was drained  And catheter was removed with out difficulty.    Preformed by: Golden Hurter, CMA

## 2015-08-10 ENCOUNTER — Telehealth: Payer: Self-pay | Admitting: Obstetrics and Gynecology

## 2015-08-10 DIAGNOSIS — R300 Dysuria: Secondary | ICD-10-CM

## 2015-08-10 MED ORDER — UROGESIC-BLUE 81.6 MG PO TABS: 81.6000 mg | ORAL_TABLET | Freq: Four times a day (QID) | ORAL | Status: DC

## 2015-08-10 NOTE — Telephone Encounter (Signed)
Pt received samples of little blue pills yesterday at appt.  They were only given 4.  She would like to get a prescription.  They use [facility] in [place].  Please call his wife, Wilburn Cornelia [phone]

## 2015-08-10 NOTE — Telephone Encounter (Signed)
Medication sent to pt pharmacy. LMOM for pt making aware.

## 2015-08-13 LAB — CULTURE, URINE COMPREHENSIVE

## 2015-08-16 ENCOUNTER — Telehealth: Payer: Self-pay

## 2015-08-16 NOTE — Telephone Encounter (Signed)
-----   Message from Roda Shutters, [place] sent at 08/16/2015 10:59 AM EDT ----- Please notify patient that his urine culture did grow out some bacteria but that it is most consistent with contamination.  If he is continuing to experience symptoms I would like him to come back in and provide another CLEAN catch specimen.  thanks

## 2015-08-16 NOTE — Telephone Encounter (Signed)
Mailbox full

## 2015-08-17 NOTE — Telephone Encounter (Signed)
Spoke with [place], pt daughter, in reference to pt and UTI s/s. Wilburn Cornelia stated pt is currently feeling "pretty good". Wilburn Cornelia denied coming back in for another urine specimen. Nurse reinforced with Wilburn Cornelia to keep f/u appt and call back if the symptoms return. [place] voiced understanding.

## 2015-08-22 ENCOUNTER — Encounter: Payer: Self-pay | Admitting: Surgery

## 2015-08-22 ENCOUNTER — Ambulatory Visit (INDEPENDENT_AMBULATORY_CARE_PROVIDER_SITE_OTHER): Payer: Medicare Other | Admitting: Surgery

## 2015-08-22 ENCOUNTER — Encounter (INDEPENDENT_AMBULATORY_CARE_PROVIDER_SITE_OTHER): Payer: Self-pay

## 2015-08-22 VITALS — BP 128/84 | HR 71 | Temp 98.1°F | Ht 65.0 in | Wt 126.0 lb

## 2015-08-22 DIAGNOSIS — K5732 Diverticulitis of large intestine without perforation or abscess without bleeding: Secondary | ICD-10-CM

## 2015-08-22 NOTE — Patient Instructions (Signed)
You do not need to follow-up in our office unless you develop concerns or questions. Please call if this is the case and speak with a nurse.  You are doing very well. Continue to use the walker until you have gotten your strength back.

## 2015-08-22 NOTE — Progress Notes (Signed)
Outpatient postop visit  08/22/2015  Jesse Macias is an 81 y.o. male.    Procedure: Hartman's procedure  CC: postop Hartman's procedure  HPI:  This patient status post Hartman's procedure for perforated diverticulitis had a prolonged hospital course due to his severe malnutrition and underlying medical problems. This in addition to his age and status.  patient has no problems has been regaining his strength and is presently using his walker but not so much at home any longer.  Medications reviewed.    Physical Exam:  BP 128/84 mmHg  Pulse 71  Temp(Src) 98.1 F (36.7 C) (Oral)  Ht 5\' 5"  (1.651 m)  Wt 126 lb (57.153 kg)  BMI 20.97 kg/m2    PE:  Weak and frail  But much improved. He is actually standing up now and walking on his own.  Abdomen is soft nontender midline wound is well healed without erythema or drainage and ostomy is functional.    Assessment/Plan:   status post Hartman's procedure for perforated diverticulitis.  Overall seems to be improving considerably.  No further problems with the ostomy bag leakage. Patient is doing very well. In general I think this patient would be a very poor candidate for ostomy reversal enclosure but I would be happy to entertain that at a later date should they requested. Currently they can follow up on an as-needed basis.  Florene Glen, MD, FACS

## 2015-08-23 ENCOUNTER — Telehealth: Payer: Self-pay | Admitting: Obstetrics and Gynecology

## 2015-08-23 DIAGNOSIS — N400 Enlarged prostate without lower urinary tract symptoms: Secondary | ICD-10-CM

## 2015-08-23 NOTE — Telephone Encounter (Signed)
Patient was prescribed Flomax in the ER and feels like it is really helping.  Would you be willing to prescribe him a refill?  He uses the [facility] in [place], [place].

## 2015-08-24 MED ORDER — FINASTERIDE 5 MG PO TABS: 5.0000 mg | ORAL_TABLET | Freq: Every day | ORAL | Status: DC

## 2015-08-24 MED ORDER — TAMSULOSIN HCL 0.4 MG PO CAPS: 0.4000 mg | ORAL_CAPSULE | Freq: Every day | ORAL | Status: DC

## 2015-08-24 NOTE — Telephone Encounter (Signed)
He should have already been on Flomax and finasteride per my last note. Will you send in a refill for him please?

## 2015-08-24 NOTE — Telephone Encounter (Signed)
Both medications sent to pharmacy 

## 2015-08-30 ENCOUNTER — Telehealth: Payer: Self-pay

## 2015-08-30 NOTE — Telephone Encounter (Signed)
Patient's wife called in requesting that an order be faxed over to [facility] for [employer]. No other supplies need order at this time.

## 2015-08-31 NOTE — Telephone Encounter (Signed)
Informed to fax order to [facility] at [phone]. Order was created and faxed at this time.

## 2015-09-03 ENCOUNTER — Telehealth: Payer: Self-pay

## 2015-09-03 NOTE — Telephone Encounter (Signed)
Received a call this morning from [facility] stating that this prescription must be sent through the distributing company or we need to start secure start services in order to get this for the patient.

## 2015-09-03 NOTE — Telephone Encounter (Signed)
Call made to patient's wife at this time. She states that [facility] is bringing supplies to the home at this time.   Call was made to [facility] [phone] at this time. Spoke with Hilda Blades. She informed me that they can order these supplies through [facility] and asked that the script be faxed over. Fax number [phone]; Attn: Hilda Blades.  This order was faxed over at this time.

## 2015-09-10 ENCOUNTER — Encounter: Payer: Self-pay | Admitting: Obstetrics and Gynecology

## 2015-09-10 ENCOUNTER — Ambulatory Visit (INDEPENDENT_AMBULATORY_CARE_PROVIDER_SITE_OTHER): Payer: Medicare Other | Admitting: Obstetrics and Gynecology

## 2015-09-10 VITALS — BP 154/91 | HR 63 | Resp 16 | Ht 65.0 in | Wt 131.3 lb

## 2015-09-10 DIAGNOSIS — N400 Enlarged prostate without lower urinary tract symptoms: Secondary | ICD-10-CM

## 2015-09-10 DIAGNOSIS — R339 Retention of urine, unspecified: Secondary | ICD-10-CM | POA: Diagnosis not present

## 2015-09-10 LAB — MICROSCOPIC EXAMINATION: RBC MICROSCOPIC, UA: NONE SEEN /HPF (ref 0–?)

## 2015-09-10 LAB — URINALYSIS, COMPLETE
Bilirubin, UA: NEGATIVE
Glucose, UA: NEGATIVE
KETONES UA: NEGATIVE
Leukocytes, UA: NEGATIVE
NITRITE UA: NEGATIVE
Protein, UA: NEGATIVE
RBC, UA: NEGATIVE
Specific Gravity, UA: 1.02 (ref 1.005–1.030)
UUROB: 0.2 mg/dL (ref 0.2–1.0)
pH, UA: 5 (ref 5.0–7.5)

## 2015-09-10 LAB — BLADDER SCAN AMB NON-IMAGING

## 2015-09-10 MED ORDER — TAMSULOSIN HCL 0.4 MG PO CAPS: 0.4000 mg | ORAL_CAPSULE | Freq: Every day | ORAL | Status: DC

## 2015-09-10 MED ORDER — FINASTERIDE 5 MG PO TABS: 5.0000 mg | ORAL_TABLET | Freq: Every day | ORAL | Status: DC

## 2015-09-10 NOTE — Progress Notes (Signed)
09/10/2015 7:52 PM   Jesse Macias Feb 23, 1924 [id number]  Referring provider: No referring provider defined for this encounter.  Chief Complaint  Patient presents with  . Urinary Retention    HPI: Patient is a 81 year old male with a history of BPH with associated urinary retention and incontinence. He presents today for follow-up. He is currently managed on tamsulosin and finasteride. He reports significant improvement in incontinence episodes. He states that he used to saturate 4 pull-ups daily and now does not have to change them at all. He denies any difficulty urinating. He does continue to complain of nocturia aproximately 2-3 times per night but states this is much improved from his baseline urinary symptoms. He has experienced no further dysuria as mentioned at his last appointment.    PMH: Past Medical History  Diagnosis Date  . Hypertension   . History of hiatal hernia   . Diabetes mellitus without complication ([place])   . Diverticulitis   . Hyperlipemia   . Chronic kidney disease     acute renal failure, chronic kidney disease stage III  . Anemia     anemia of chronic renal disease  . Neuromuscular disorder ([place])     peripheral neuropathy  . Glaucoma     Surgical History: Past Surgical History  Procedure Laterality Date  . Hernia repair      Two  . Back surgery    . Colectomy with colostomy creation/hartmann procedure N/A 06/09/2015    Procedure: COLECTOMY WITH COLOSTOMY CREATION/HARTMANN PROCEDURE;  Surgeon: Florene Glen, MD;  Location: [facility] ORS;  Service: General;  Laterality: N/A;  . Central venous catheter insertion N/A 06/09/2015    Procedure: INSERTION CENTRAL LINE ADULT;  Surgeon: Florene Glen, MD;  Location: [facility] ORS;  Service: General;  Laterality: N/A;    Home Medications:    Medication List       This list is accurate as of: 09/10/15  7:52 PM.  Always use your most recent med list.               apixaban 2.5 MG Tabs tablet  Commonly  known as:  ELIQUIS  Take 1 tablet (2.5 mg total) by mouth 2 (two) times daily.     brimonidine 0.2 % ophthalmic solution  Commonly known as:  ALPHAGAN  Place 2 drops into both eyes daily.     dorzolamide 2 % ophthalmic solution  Commonly known as:  TRUSOPT  Place 2 drops into both eyes daily.     finasteride 5 MG tablet  Commonly known as:  PROSCAR  Take 1 tablet (5 mg total) by mouth daily.     latanoprost 0.005 % ophthalmic solution  Commonly known as:  XALATAN  Place 2 drops into both eyes daily.     losartan-hydrochlorothiazide 100-12.5 MG tablet  Commonly known as:  HYZAAR  Take 1 tablet by mouth daily.     metoprolol 200 MG 24 hr tablet  Commonly known as:  TOPROL XL  Take 1 tablet (200 mg total) by mouth daily.     tamsulosin 0.4 MG Caps capsule  Commonly known as:  FLOMAX  Take 1 capsule (0.4 mg total) by mouth daily after supper.     timolol 0.25 % ophthalmic solution  Commonly known as:  TIMOPTIC  Place 2 drops into both eyes daily.        Allergies: No Known Allergies  Family History: Family History  Problem Relation Age of Onset  . Diabetes Mother   . Diverticulitis Mother  Social History:  reports that he has never smoked. He has never used smokeless tobacco. He reports that he does not drink alcohol or use illicit drugs.  ROS: UROLOGY Frequent Urination?: No Hard to postpone urination?: No Burning/pain with urination?: No Get up at night to urinate?: Yes Leakage of urine?: No Urine stream starts and stops?: No Trouble starting stream?: No Do you have to strain to urinate?: No Blood in urine?: No Urinary tract infection?: No Sexually transmitted disease?: No Injury to kidneys or bladder?: No Painful intercourse?: No Weak stream?: No Erection problems?: No Penile pain?: No  Gastrointestinal Nausea?: No Vomiting?: No Indigestion/heartburn?: No Diarrhea?: No Constipation?: No  Constitutional Fever: No Night sweats?: No Weight  loss?: No Fatigue?: No  Skin Skin rash/lesions?: No Itching?: No  Eyes Blurred vision?: No Double vision?: No  Ears/Nose/Throat Sore throat?: No Sinus problems?: No  Hematologic/Lymphatic Swollen glands?: No Easy bruising?: No  Cardiovascular Leg swelling?: No Chest pain?: No  Respiratory Cough?: No Shortness of breath?: No  Endocrine Excessive thirst?: No  Musculoskeletal Back pain?: No Joint pain?: No  Neurological Headaches?: No Dizziness?: No  Psychologic Depression?: No Anxiety?: No  Physical Exam: BP 154/91 mmHg  Pulse 63  Resp 16  Ht 5\' 5"  (1.651 m)  Wt 131 lb 4.8 oz (59.557 kg)  BMI 21.85 kg/m2  Constitutional:  Alert and oriented, No acute distress. HEENT: [place] AT, moist mucus membranes.  Trachea midline, no masses. Cardiovascular: No clubbing, cyanosis, or edema. Respiratory: Normal respiratory effort, no increased work of breathing. Skin: No rashes, bruises or suspicious lesions. Neurologic: Grossly intact, no focal deficits, moving all 4 extremities. Psychiatric: Normal mood and affect.  Laboratory Data:   Urinalysis Results for orders placed or performed in visit on 09/10/15  Microscopic Examination  Result Value Ref Range   WBC, UA 0-5 0 -  5 /hpf   RBC, UA None seen 0 -  2 /hpf   Epithelial Cells (non renal) 0-10 0 - 10 /hpf   Casts Present (A) None seen /lpf   Cast Type Hyaline casts N/A   Bacteria, UA Few None seen/Few  Urinalysis, Complete  Result Value Ref Range   Specific Gravity, UA 1.020 1.005 - 1.030   pH, UA 5.0 5.0 - 7.5   Color, UA Yellow Yellow   Appearance Ur Clear Clear   Leukocytes, UA Negative Negative   Protein, UA Negative Negative/Trace   Glucose, UA Negative Negative   Ketones, UA Negative Negative   RBC, UA Negative Negative   Bilirubin, UA Negative Negative   Urobilinogen, Ur 0.2 0.2 - 1.0 mg/dL   Nitrite, UA Negative Negative   Microscopic Examination See below:   BLADDER SCAN AMB NON-IMAGING   Result Value Ref Range   Scan Result 38 mL      Pertinent Imaging:   Assessment & Plan:    1. Urinary retention- Postoperative. PVR 69mL today.  Will continue to monitor.  - Urinalysis, Complete - BLADDER SCAN AMB NON-IMAGING  2. BPH with LUTS- patient reports that he is very pleased with current management of urinary symptoms. Will continue Flomax and Finasteride. We will follow up in 6 months for recheck and then once yearly/when necessary. Medications refilled today.  Return in about 6 months (around 03/09/2016).  These notes generated with voice recognition software. I apologize for typographical errors.  Herbert Moors, [facility] [address], [place], [place] [address] [phone]

## 2015-12-24 ENCOUNTER — Telehealth: Payer: Self-pay | Admitting: Obstetrics and Gynecology

## 2015-12-24 DIAGNOSIS — N400 Enlarged prostate without lower urinary tract symptoms: Secondary | ICD-10-CM

## 2015-12-24 MED ORDER — FINASTERIDE 5 MG PO TABS: 5.0000 mg | ORAL_TABLET | Freq: Every day | ORAL | Status: DC

## 2015-12-24 MED ORDER — TAMSULOSIN HCL 0.4 MG PO CAPS: 0.4000 mg | ORAL_CAPSULE | Freq: Every day | ORAL | Status: DC

## 2015-12-24 NOTE — Telephone Encounter (Signed)
Spoke with wife and made aware pt does need to continue medications. Refills sent to pharmacy.

## 2015-12-24 NOTE — Telephone Encounter (Signed)
Pt's wife called and wants to know if pt needs to stay on meds Finasteride 5 mg, Tamsulosin .4 mg until his upcoming appt in May.   Both bottles say no refill, must be authorized.  He only has 2 days left.  [phone]

## 2016-02-05 ENCOUNTER — Inpatient Hospital Stay: Payer: Medicare Other | Attending: Internal Medicine

## 2016-02-05 ENCOUNTER — Other Ambulatory Visit: Payer: Self-pay | Admitting: *Deleted

## 2016-02-05 ENCOUNTER — Inpatient Hospital Stay (HOSPITAL_BASED_OUTPATIENT_CLINIC_OR_DEPARTMENT_OTHER): Payer: Medicare Other | Admitting: Internal Medicine

## 2016-02-05 VITALS — BP 160/91 | HR 53 | Temp 96.7°F | Wt 142.6 lb

## 2016-02-05 DIAGNOSIS — I129 Hypertensive chronic kidney disease with stage 1 through stage 4 chronic kidney disease, or unspecified chronic kidney disease: Secondary | ICD-10-CM | POA: Insufficient documentation

## 2016-02-05 DIAGNOSIS — D631 Anemia in chronic kidney disease: Secondary | ICD-10-CM

## 2016-02-05 DIAGNOSIS — Z79899 Other long term (current) drug therapy: Secondary | ICD-10-CM

## 2016-02-05 DIAGNOSIS — N183 Chronic kidney disease, stage 3 (moderate): Secondary | ICD-10-CM | POA: Diagnosis not present

## 2016-02-05 DIAGNOSIS — E119 Type 2 diabetes mellitus without complications: Secondary | ICD-10-CM | POA: Diagnosis not present

## 2016-02-05 DIAGNOSIS — H409 Unspecified glaucoma: Secondary | ICD-10-CM | POA: Diagnosis not present

## 2016-02-05 DIAGNOSIS — D472 Monoclonal gammopathy: Secondary | ICD-10-CM

## 2016-02-05 DIAGNOSIS — E785 Hyperlipidemia, unspecified: Secondary | ICD-10-CM | POA: Diagnosis not present

## 2016-02-05 LAB — CBC WITH DIFFERENTIAL/PLATELET
BASOS ABS: 0.1 10*3/uL (ref 0–0.1)
BASOS PCT: 1 %
Eosinophils Absolute: 0.3 10*3/uL (ref 0–0.7)
Eosinophils Relative: 3 %
HEMATOCRIT: 40 % (ref 40.0–52.0)
HEMOGLOBIN: 13.5 g/dL (ref 13.0–18.0)
LYMPHS PCT: 26 %
Lymphs Abs: 3.3 10*3/uL (ref 1.0–3.6)
MCH: 32.6 pg (ref 26.0–34.0)
MCHC: 33.7 g/dL (ref 32.0–36.0)
MCV: 96.6 fL (ref 80.0–100.0)
MONOS PCT: 8 %
Monocytes Absolute: 1 10*3/uL (ref 0.2–1.0)
NEUTROS ABS: 8.2 10*3/uL — AB (ref 1.4–6.5)
NEUTROS PCT: 64 %
Platelets: 254 10*3/uL (ref 150–440)
RBC: 4.14 MIL/uL — ABNORMAL LOW (ref 4.40–5.90)
RDW: 14.4 % (ref 11.5–14.5)
WBC: 12.9 10*3/uL — ABNORMAL HIGH (ref 3.8–10.6)

## 2016-02-05 LAB — COMPREHENSIVE METABOLIC PANEL
ALBUMIN: 3.8 g/dL (ref 3.5–5.0)
ALK PHOS: 57 U/L (ref 38–126)
ALT: 18 U/L (ref 17–63)
AST: 23 U/L (ref 15–41)
Anion gap: 3 — ABNORMAL LOW (ref 5–15)
BILIRUBIN TOTAL: 0.8 mg/dL (ref 0.3–1.2)
BUN: 32 mg/dL — AB (ref 6–20)
CALCIUM: 9.4 mg/dL (ref 8.9–10.3)
CO2: 30 mmol/L (ref 22–32)
Chloride: 106 mmol/L (ref 101–111)
Creatinine, Ser: 1.78 mg/dL — ABNORMAL HIGH (ref 0.61–1.24)
GFR calc Af Amer: 37 mL/min — ABNORMAL LOW (ref >60)
GFR calc non Af Amer: 32 mL/min — ABNORMAL LOW (ref >60)
GLUCOSE: 110 mg/dL — AB (ref 65–99)
Potassium: 3.4 mmol/L — ABNORMAL LOW (ref 3.5–5.1)
Sodium: 139 mmol/L (ref 135–145)
TOTAL PROTEIN: 6.8 g/dL (ref 6.5–8.1)

## 2016-02-05 NOTE — Progress Notes (Signed)
[facility] CONSULT NOTE  Patient Care Team: No Pcp Per Patient as PCP - General (General Practice) Dr. Ouida Sills  CHIEF COMPLAINTS/PURPOSE OF CONSULTATION:  Abnormal SPEP  ONCOLOGIC HISTORY:  # SEP 2016- M PROTEIN [0.2gm/dl]; K/L ratio=N  # CKD [stage III; Dr.Lateef]; Urinary Obstruction [s/p cath- removed]  HISTORY OF PRESENTING ILLNESS: Patient is extremely hard of hearing; accompanied by his wife-major historian Brayton Baumgartner 82 y.o.  male with multiple medical problems including chronic kidney disease [not on dialysis]; and also diagnosed with MGUS in September 2016 is here for follow-up.  Patient had his Foley catheter taken out since last the visit in 6 months ago. His appetite is fair. He is not losing any weight. No swelling in legs. He is up and about most of the time. He walks by himself.  ROS:  He does complain of fatigue/stable; chronic not worse.. A complete 10 point review of system is done which is negative except mentioned above in history of present illness  MEDICAL HISTORY:  Past Medical History  Diagnosis Date  . Hypertension   . History of hiatal hernia   . Diabetes mellitus without complication ([place])   . Diverticulitis   . Hyperlipemia   . Chronic kidney disease     acute renal failure, chronic kidney disease stage III  . Anemia     anemia of chronic renal disease  . Neuromuscular disorder ([place])     peripheral neuropathy  . Glaucoma     SURGICAL HISTORY: Past Surgical History  Procedure Laterality Date  . Hernia repair      Two  . Back surgery    . Colectomy with colostomy creation/hartmann procedure N/A 06/09/2015    Procedure: COLECTOMY WITH COLOSTOMY CREATION/HARTMANN PROCEDURE;  Surgeon: Florene Glen, MD;  Location: [facility] ORS;  Service: General;  Laterality: N/A;  . Central venous catheter insertion N/A 06/09/2015    Procedure: INSERTION CENTRAL LINE ADULT;  Surgeon: Florene Glen, MD;  Location: [facility] ORS;  Service: General;   Laterality: N/A;    SOCIAL HISTORY: Social History   Social History  . Marital Status: Married    Spouse Name: N/A  . Number of Children: N/A  . Years of Education: N/A   Occupational History  . Not on file.   Social History Main Topics  . Smoking status: Never Smoker   . Smokeless tobacco: Never Used  . Alcohol Use: No     Comment: quit in 1961  . Drug Use: No  . Sexual Activity: Not on file   Other Topics Concern  . Not on file   Social History Narrative    FAMILY HISTORY: Family History  Problem Relation Age of Onset  . Diabetes Mother   . Diverticulitis Mother     ALLERGIES:  has No Known Allergies.  MEDICATIONS:  Current Outpatient Prescriptions  Medication Sig Dispense Refill  . apixaban (ELIQUIS) 2.5 MG TABS tablet Take 1 tablet (2.5 mg total) by mouth 2 (two) times daily. 60 tablet 0  . brimonidine (ALPHAGAN) 0.2 % ophthalmic solution Place 2 drops into both eyes daily.    . dorzolamide (TRUSOPT) 2 % ophthalmic solution Place 2 drops into both eyes daily.    . finasteride (PROSCAR) 5 MG tablet Take by mouth.    . latanoprost (XALATAN) 0.005 % ophthalmic solution Place 2 drops into both eyes daily.    Marland Kitchen losartan-hydrochlorothiazide (HYZAAR) 100-12.5 MG tablet Take 1 tablet by mouth daily. 30 tablet 0  . metoprolol (TOPROL XL)  200 MG 24 hr tablet Take 1 tablet (200 mg total) by mouth daily. 30 tablet 11  . tamsulosin (FLOMAX) 0.4 MG CAPS capsule Take by mouth.    . timolol (TIMOPTIC) 0.25 % ophthalmic solution Place 2 drops into both eyes daily.     No current facility-administered medications for this visit.      Marland Kitchen  PHYSICAL EXAMINATION: ECOG PERFORMANCE STATUS: 3 - Symptomatic, >50% confined to bed  Filed Vitals:   02/05/16 1048  BP: 160/91  Pulse: 53  Temp: 96.7 F (35.9 C)   Filed Weights   02/05/16 1046  Weight: 142 lb 10.2 oz (64.7 kg)    GENERAL: Thin build cachectic-appearing male patient accompanied by his wife. Alert, no distress  and comfortable.   His walking by himself. EYES: no pallor or icterus OROPHARYNX: Poor dentition NECK: supple, no masses felt LYMPH:  no palpable lymphadenopathy in the cervical, axillary or inguinal regions LUNGS: Positive for crackles bilateral lung bases. HEART/CVS: regular rate & rhythm and no murmurs; No lower extremity edema ABDOMEN: abdomen soft, non-tender and normal bowel sounds; colostomy bag. Musculoskeletal:no cyanosis of digits and no clubbing  PSYCH: alert & oriented x 3 with fluent speech NEURO: no focal motor/sensory deficits SKIN:  no rashes or significant lesions  LABORATORY DATA:  I have reviewed the data as listed Lab Results  Component Value Date   WBC 12.9* 02/05/2016   HGB 13.5 02/05/2016   HCT 40.0 02/05/2016   MCV 96.6 02/05/2016   PLT 254 02/05/2016    Recent Labs  06/10/15 0552  06/22/15 0429  06/25/15 0319 06/26/15 0410 06/27/15 0520 07/11/15 0953  NA 136  < > 138  --  137 136 140 141  K 3.5  < > 3.8  --  3.7 3.3* 3.2* 3.0*  CL 101  < > 108  --  111 111 115* 97  CO2 28  < > 25  --  21* 22 22 29   GLUCOSE 199*  < > 112*  --  85 102* 106* 134*  BUN 28*  < > 61*  --  60* 56* 48* 17  CREATININE 1.42*  < > 1.48*  < > 2.42* 2.40* 1.75* 1.32*  CALCIUM 8.9  < > 7.9*  --  7.6* 7.5* 7.6* 8.4*  GFRNONAA 42*  < > 40*  < > 22* 22* 33* 47*  GFRAA 49*  < > 47*  < > 26* 26* 38* 55*  PROT 5.1*  --  4.0*  --   --   --   --  4.6*  ALBUMIN 2.4*  --  1.9*  --  1.9*  --   --  2.6*  AST 23  --  29  --   --   --   --  33  ALT 14*  --  56  --   --   --   --  33  ALKPHOS 46  --  80  --   --   --   --  79  BILITOT 1.5*  --  1.3*  --   --   --   --  0.4  < > = values in this interval not displayed.   ASSESSMENT & PLAN:   # MGUS- Incidental M protein 0.2 g/dL September 2016. kappa lambda light ratio- normal. Myeloma numbers from today are pending. Chronic kidney disease- creatinine today 1.7; no evidence of hypercalcemia and anemia. Reviewed with the patient that  MGUS could evolve into multiple myeloma over  years.   # CKD- creatinine today is 1.7- slightly elevated from 6 months ago from 1.3. We will fax the records to patient's nephrologist office.  #  Given patient's multiple comorbidities/age- I think it's reasonable to monitor him once a year with M protein/kappa lambda light chain CBC CMP.    Cammie Sickle, MD 02/05/2016 10:54 AM

## 2016-02-06 LAB — IMMUNOFIXATION ELECTROPHORESIS
IgA: 118 mg/dL (ref 61–437)
IgG (Immunoglobin G), Serum: 800 mg/dL (ref 700–1600)
IgM, Serum: 29 mg/dL (ref 15–143)
Total Protein ELP: 5.9 g/dL — ABNORMAL LOW (ref 6.0–8.5)

## 2016-02-06 LAB — PROTEIN ELECTROPHORESIS, SERUM
A/G RATIO SPE: 1.1 (ref 0.7–1.7)
ALBUMIN ELP: 3.3 g/dL (ref 2.9–4.4)
Alpha-1-Globulin: 0.3 g/dL (ref 0.0–0.4)
Alpha-2-Globulin: 0.7 g/dL (ref 0.4–1.0)
Beta Globulin: 1 g/dL (ref 0.7–1.3)
GLOBULIN, TOTAL: 2.9 g/dL (ref 2.2–3.9)
Gamma Globulin: 0.9 g/dL (ref 0.4–1.8)
TOTAL PROTEIN ELP: 6.2 g/dL (ref 6.0–8.5)

## 2016-03-12 ENCOUNTER — Ambulatory Visit (INDEPENDENT_AMBULATORY_CARE_PROVIDER_SITE_OTHER): Payer: Medicare Other | Admitting: Urology

## 2016-03-12 ENCOUNTER — Encounter: Payer: Self-pay | Admitting: Urology

## 2016-03-12 VITALS — BP 127/71 | HR 57 | Ht 64.0 in | Wt 145.0 lb

## 2016-03-12 DIAGNOSIS — R339 Retention of urine, unspecified: Secondary | ICD-10-CM

## 2016-03-12 DIAGNOSIS — N138 Other obstructive and reflux uropathy: Secondary | ICD-10-CM

## 2016-03-12 DIAGNOSIS — N401 Enlarged prostate with lower urinary tract symptoms: Secondary | ICD-10-CM

## 2016-03-12 LAB — BLADDER SCAN AMB NON-IMAGING: SCAN RESULT: 9

## 2016-03-12 MED ORDER — TAMSULOSIN HCL 0.4 MG PO CAPS: 0.4000 mg | ORAL_CAPSULE | Freq: Every day | ORAL | Status: DC

## 2016-03-12 MED ORDER — FINASTERIDE 5 MG PO TABS: 5.0000 mg | ORAL_TABLET | Freq: Every day | ORAL | Status: DC

## 2016-03-12 NOTE — Progress Notes (Signed)
2:41 PM   Jesse Macias 1924/02/13 [id number]  Referring provider: No referring provider defined for this encounter.  Chief Complaint  Patient presents with  . Benign Prostatic Hypertrophy    6 month follow up    HPI: Patient is a 82 year old Caucasian male with a history of urinary retention and BPH with LUTS who presents today for 6 month follow-up.  History of urinary retention Patient is not experiencing symptoms of urinary retention. His PVR on today's exam is 9 mL.  He will continue tamsulosin 0.4 mg and finasteride 5 mg daily.  BPH WITH LUTS His IPSS score today is 2, which is mild lower urinary tract symptomatology. He is mostly satisfied with his quality life due to his urinary symptoms. His PVR is 9 mL.  His previous PVR is 38 mL.  He denies any dysuria, hematuria or suprapubic pain.  He currently taking tamsulosin 0.4 mg daily and finasteride 5 mg daily.  He also denies any recent fevers, chills, nausea or vomiting.  He does not have a family history of PCa.      IPSS      03/12/16 1300       International Prostate Symptom Score   How often have you had the sensation of not emptying your bladder? Not at All     How often have you had to urinate less than every two hours? Not at All     How often have you found you stopped and started again several times when you urinated? Not at All     How often have you found it difficult to postpone urination? Not at All     How often have you had a weak urinary stream? Not at All     How often have you had to strain to start urination? Not at All     How many times did you typically get up at night to urinate? 2 Times     Total IPSS Score 2     Quality of Life due to urinary symptoms   If you were to spend the rest of your life with your urinary condition just the way it is now how would you feel about that? Mostly Satisfied        Score:  1-7 Mild 8-19 Moderate 20-35 Severe  PMH: Past Medical History  Diagnosis Date   . Hypertension   . History of hiatal hernia   . Diabetes mellitus without complication ([place])   . Diverticulitis   . Hyperlipemia   . Chronic kidney disease     acute renal failure, chronic kidney disease stage III  . Anemia     anemia of chronic renal disease  . Neuromuscular disorder ([place])     peripheral neuropathy  . Glaucoma     Surgical History: Past Surgical History  Procedure Laterality Date  . Hernia repair      Two  . Back surgery    . Colectomy with colostomy creation/hartmann procedure N/A 06/09/2015    Procedure: COLECTOMY WITH COLOSTOMY CREATION/HARTMANN PROCEDURE;  Surgeon: Florene Glen, MD;  Location: [facility] ORS;  Service: General;  Laterality: N/A;  . Central venous catheter insertion N/A 06/09/2015    Procedure: INSERTION CENTRAL LINE ADULT;  Surgeon: Florene Glen, MD;  Location: [facility] ORS;  Service: General;  Laterality: N/A;    Home Medications:    Medication List       This list is accurate as of: 03/12/16 11:59 PM.  Always use your most recent  med list.               apixaban 2.5 MG Tabs tablet  Commonly known as:  ELIQUIS  Take 1 tablet (2.5 mg total) by mouth 2 (two) times daily.     brimonidine 0.2 % ophthalmic solution  Commonly known as:  ALPHAGAN  Place 2 drops into both eyes daily.     dorzolamide 2 % ophthalmic solution  Commonly known as:  TRUSOPT  Place 2 drops into both eyes daily.     finasteride 5 MG tablet  Commonly known as:  PROSCAR  Take 1 tablet (5 mg total) by mouth daily.     latanoprost 0.005 % ophthalmic solution  Commonly known as:  XALATAN  Place 2 drops into both eyes daily.     losartan-hydrochlorothiazide 100-12.5 MG tablet  Commonly known as:  HYZAAR  Take 1 tablet by mouth daily.     metoprolol 200 MG 24 hr tablet  Commonly known as:  TOPROL XL  Take 1 tablet (200 mg total) by mouth daily.     tamsulosin 0.4 MG Caps capsule  Commonly known as:  FLOMAX  Take 1 capsule (0.4 mg total) by mouth daily.      timolol 0.5 % ophthalmic solution  Commonly known as:  TIMOPTIC        Allergies: No Known Allergies  Family History: Family History  Problem Relation Age of Onset  . Diabetes Mother   . Diverticulitis Mother   . Kidney disease Neg Hx   . Prostate cancer Neg Hx     Social History:  reports that he has never smoked. He has never used smokeless tobacco. He reports that he does not drink alcohol or use illicit drugs.  ROS: UROLOGY Frequent Urination?: No Hard to postpone urination?: No Burning/pain with urination?: No Get up at night to urinate?: No Leakage of urine?: No Urine stream starts and stops?: No Trouble starting stream?: No Do you have to strain to urinate?: No Blood in urine?: No Urinary tract infection?: No Sexually transmitted disease?: No Injury to kidneys or bladder?: No Painful intercourse?: No Weak stream?: No Erection problems?: No Penile pain?: No  Gastrointestinal Nausea?: No Vomiting?: No Indigestion/heartburn?: No Diarrhea?: No Constipation?: No  Constitutional Fever: No Night sweats?: No Weight loss?: No Fatigue?: No  Skin Skin rash/lesions?: No Itching?: No  Eyes Blurred vision?: No Double vision?: No  Ears/Nose/Throat Sore throat?: No Sinus problems?: No  Hematologic/Lymphatic Swollen glands?: No Easy bruising?: No  Cardiovascular Leg swelling?: No Chest pain?: No  Respiratory Cough?: No Shortness of breath?: No  Endocrine Excessive thirst?: No  Musculoskeletal Back pain?: No Joint pain?: No  Neurological Headaches?: No Dizziness?: No  Psychologic Depression?: Yes Anxiety?: No  Physical Exam: BP 127/71 mmHg  Pulse 57  Ht 5\' 4"  (1.626 m)  Wt 145 lb (65.772 kg)  BMI 24.88 kg/m2  Constitutional: Well nourished. Alert and oriented, No acute distress. HEENT: [place] AT, moist mucus membranes. Trachea midline, no masses. Cardiovascular: No clubbing, cyanosis, or edema. Respiratory: Normal respiratory  effort, no increased work of breathing. GI: Abdomen is soft, non tender, non distended, no abdominal masses. Liver and spleen not palpable.  No hernias appreciated.  Stool sample for occult testing is not indicated.   GU: No CVA tenderness.  No bladder fullness or masses.  Patient with uncircumcised phallus. Foreskin easily retracted  Urethral meatus is patent.  No penile discharge. No penile lesions or rashes. Scrotum without lesions, cysts, rashes and/or edema.  Testicles are located scrotally bilaterally. No masses are appreciated in the testicles. Left and right epididymis are normal. Rectal: Patient with  normal sphincter tone. Anus and perineum without scarring or rashes. No rectal masses are appreciated. Prostate is approximately 60 grams, no nodules are appreciated. Seminal vesicles are normal. Skin: No rashes, bruises or suspicious lesions. Lymph: No cervical or inguinal adenopathy. Neurologic: Grossly intact, no focal deficits, moving all 4 extremities. Psychiatric: Normal mood and affect.   Pertinent Imaging: Results for LEHI, CORNS (MRN [id number]) as of 03/12/2016 13:58  Ref. Range 03/12/2016 13:45  Scan Result Unknown 9    Assessment & Plan:    1. Urinary retention- Postoperative. PVR 9 mL today.  Will continue to monitor.   - BLADDER SCAN AMB NON-IMAGING  2. BPH with LUTS- IPSS 2/2.  patient reports that he is very pleased with current management of urinary symptoms. Will continue Flomax and Finasteride. We will follow up in 12 months for IPSS score, PVR and exam.  Medications refilled today.  Return in about 1 year (around 03/12/2017) for IPSS, PVR and exam.  These notes generated with voice recognition software. I apologize for typographical errors.  Zara Council, [facility] [address], [place], [place] [address] [phone]

## 2016-03-16 DIAGNOSIS — N138 Other obstructive and reflux uropathy: Secondary | ICD-10-CM | POA: Insufficient documentation

## 2016-03-16 DIAGNOSIS — N401 Enlarged prostate with lower urinary tract symptoms: Principal | ICD-10-CM

## 2017-02-03 ENCOUNTER — Inpatient Hospital Stay (HOSPITAL_BASED_OUTPATIENT_CLINIC_OR_DEPARTMENT_OTHER): Payer: Medicare Other | Admitting: Internal Medicine

## 2017-02-03 ENCOUNTER — Inpatient Hospital Stay: Payer: Medicare Other | Attending: Internal Medicine

## 2017-02-03 VITALS — BP 157/95 | HR 63 | Temp 97.8°F | Resp 18 | Ht 64.0 in | Wt 131.2 lb

## 2017-02-03 DIAGNOSIS — H409 Unspecified glaucoma: Secondary | ICD-10-CM | POA: Insufficient documentation

## 2017-02-03 DIAGNOSIS — K449 Diaphragmatic hernia without obstruction or gangrene: Secondary | ICD-10-CM | POA: Insufficient documentation

## 2017-02-03 DIAGNOSIS — N183 Chronic kidney disease, stage 3 (moderate): Secondary | ICD-10-CM | POA: Diagnosis not present

## 2017-02-03 DIAGNOSIS — D472 Monoclonal gammopathy: Secondary | ICD-10-CM

## 2017-02-03 DIAGNOSIS — Z79899 Other long term (current) drug therapy: Secondary | ICD-10-CM | POA: Diagnosis not present

## 2017-02-03 DIAGNOSIS — D631 Anemia in chronic kidney disease: Secondary | ICD-10-CM | POA: Insufficient documentation

## 2017-02-03 DIAGNOSIS — E1122 Type 2 diabetes mellitus with diabetic chronic kidney disease: Secondary | ICD-10-CM | POA: Insufficient documentation

## 2017-02-03 DIAGNOSIS — E785 Hyperlipidemia, unspecified: Secondary | ICD-10-CM | POA: Insufficient documentation

## 2017-02-03 DIAGNOSIS — I129 Hypertensive chronic kidney disease with stage 1 through stage 4 chronic kidney disease, or unspecified chronic kidney disease: Secondary | ICD-10-CM | POA: Diagnosis not present

## 2017-02-03 LAB — CBC WITH DIFFERENTIAL/PLATELET
BASOS PCT: 1 %
Basophils Absolute: 0.1 10*3/uL (ref 0–0.1)
EOS ABS: 0.5 10*3/uL (ref 0–0.7)
Eosinophils Relative: 3 %
HCT: 40.4 % (ref 40.0–52.0)
HEMOGLOBIN: 13.5 g/dL (ref 13.0–18.0)
Lymphocytes Relative: 16 %
Lymphs Abs: 2.7 10*3/uL (ref 1.0–3.6)
MCH: 31.8 pg (ref 26.0–34.0)
MCHC: 33.3 g/dL (ref 32.0–36.0)
MCV: 95.5 fL (ref 80.0–100.0)
MONOS PCT: 7 %
Monocytes Absolute: 1.2 10*3/uL — ABNORMAL HIGH (ref 0.2–1.0)
NEUTROS PCT: 73 %
Neutro Abs: 12.6 10*3/uL — ABNORMAL HIGH (ref 1.4–6.5)
PLATELETS: 232 10*3/uL (ref 150–440)
RBC: 4.24 MIL/uL — ABNORMAL LOW (ref 4.40–5.90)
RDW: 14 % (ref 11.5–14.5)
WBC: 16.9 10*3/uL — AB (ref 3.8–10.6)

## 2017-02-03 LAB — COMPREHENSIVE METABOLIC PANEL
ALK PHOS: 64 U/L (ref 38–126)
ALT: 11 U/L — AB (ref 17–63)
AST: 18 U/L (ref 15–41)
Albumin: 3.5 g/dL (ref 3.5–5.0)
Anion gap: 7 (ref 5–15)
BUN: 29 mg/dL — AB (ref 6–20)
CALCIUM: 9.1 mg/dL (ref 8.9–10.3)
CO2: 26 mmol/L (ref 22–32)
CREATININE: 2.07 mg/dL — AB (ref 0.61–1.24)
Chloride: 104 mmol/L (ref 101–111)
GFR, EST AFRICAN AMERICAN: 31 mL/min — AB (ref >60)
GFR, EST NON AFRICAN AMERICAN: 26 mL/min — AB (ref >60)
Glucose, Bld: 118 mg/dL — ABNORMAL HIGH (ref 65–99)
Potassium: 3.3 mmol/L — ABNORMAL LOW (ref 3.5–5.1)
Sodium: 137 mmol/L (ref 135–145)
Total Bilirubin: 0.9 mg/dL (ref 0.3–1.2)
Total Protein: 5.9 g/dL — ABNORMAL LOW (ref 6.5–8.1)

## 2017-02-03 NOTE — Assessment & Plan Note (Addendum)
#   MGUS- Incidental M protein 0.2 g/dL September 2016. kappa lambda light ratio- normal. Myeloma numbers- October 2017 negative for M protein normal Lambda light chain condition. M protein from today is pending. Normal hemoglobin and calcium levels.  # Chronic kidney disease- creatinine today 1.7-2.0; [follow up with Dr.Lateef]  # Mild leukocytosis/neutrophilia- likely reactive.  #  Given patient's multiple comorbidities/age/MGUS- follow up as needed. Discussed with patient and family- if he continues to do clinically well- then recommend checking M protein levels/ on a yearly basis. Patient wants limit his doctors visits/ I think this is reasonable. He will continue to follow with nephrology/PCP. Follow-up with [place] only as needed.   CC: Dr.Lateef.

## 2017-02-03 NOTE — Progress Notes (Signed)
Jesse Macias CONSULT NOTE  Patient Care Team: No Pcp Per Patient as PCP - General (General Practice) Dr. Ouida Sills  CHIEF COMPLAINTS/PURPOSE OF CONSULTATION:    ONCOLOGIC HISTORY:  # SEP 2016- M PROTEIN [0.2gm/dl]; K/L ratio=N  # CKD [stage III; Dr.Lateef]; Urinary Obstruction [s/p cath- removed]  HISTORY OF PRESENTING ILLNESS: Patient is extremely hard of hearing; accompanied by his wife-major historian.  Jesse Macias 83 y.o.  male with multiple medical problems including chronic kidney disease [not on dialysis]; and also diagnosed with MGUS in September 2016 is here for follow-up.  His appetite is fair. He is not losing any weight. No swelling in legs. He is up and about most of the time. Chronic back pain. Patient not sure why he is following up with [place].  ROS:  A complete 10 point review of system is done which is negative except mentioned above in history of present illness  MEDICAL HISTORY:  Past Medical History:  Diagnosis Date  . Anemia    anemia of chronic renal disease  . Chronic kidney disease    acute renal failure, chronic kidney disease stage III  . Diabetes mellitus without complication ([place])   . Diverticulitis   . Glaucoma   . History of hiatal hernia   . Hyperlipemia   . Hypertension   . Neuromuscular disorder ([place])    peripheral neuropathy    SURGICAL HISTORY: Past Surgical History:  Procedure Laterality Date  . BACK SURGERY    . CENTRAL VENOUS CATHETER INSERTION N/A 06/09/2015   Procedure: INSERTION CENTRAL LINE ADULT;  Surgeon: Florene Glen, MD;  Location: [facility] ORS;  Service: General;  Laterality: N/A;  . COLECTOMY WITH COLOSTOMY CREATION/HARTMANN PROCEDURE N/A 06/09/2015   Procedure: COLECTOMY WITH COLOSTOMY CREATION/HARTMANN PROCEDURE;  Surgeon: Florene Glen, MD;  Location: [facility] ORS;  Service: General;  Laterality: N/A;  . HERNIA REPAIR     Two    SOCIAL HISTORY: Social History   Social History  . Marital status: Married     Spouse name: N/A  . Number of children: N/A  . Years of education: N/A   Occupational History  . Not on file.   Social History Main Topics  . Smoking status: Never Smoker  . Smokeless tobacco: Never Used  . Alcohol use No     Comment: quit in 1961  . Drug use: No  . Sexual activity: Not on file   Other Topics Concern  . Not on file   Social History Narrative  . No narrative on file    FAMILY HISTORY: Family History  Problem Relation Age of Onset  . Diabetes Mother   . Diverticulitis Mother   . Kidney disease Neg Hx   . Prostate cancer Neg Hx     ALLERGIES:  has No Known Allergies.  MEDICATIONS:  Current Outpatient Prescriptions  Medication Sig Dispense Refill  . apixaban (ELIQUIS) 2.5 MG TABS tablet Take 1 tablet (2.5 mg total) by mouth 2 (two) times daily. 60 tablet 0  . brimonidine (ALPHAGAN) 0.2 % ophthalmic solution Place 2 drops into both eyes daily.    . dorzolamide (TRUSOPT) 2 % ophthalmic solution Place 2 drops into both eyes daily.    Marland Kitchen latanoprost (XALATAN) 0.005 % ophthalmic solution Place 2 drops into both eyes daily.    Marland Kitchen losartan-hydrochlorothiazide (HYZAAR) 100-12.5 MG tablet Take 1 tablet by mouth daily. 30 tablet 0  . metoprolol (TOPROL XL) 200 MG 24 hr tablet Take 1 tablet (200 mg total) by mouth  daily. 30 tablet 11  . tamsulosin (FLOMAX) 0.4 MG CAPS capsule Take 1 capsule (0.4 mg total) by mouth daily. 90 capsule 3  . finasteride (PROSCAR) 5 MG tablet Take 1 tablet (5 mg total) by mouth daily. 90 tablet 3  . timolol (TIMOPTIC) 0.5 % ophthalmic solution      No current facility-administered medications for this visit.       Marland Kitchen  PHYSICAL EXAMINATION: ECOG PERFORMANCE STATUS: 3 - Symptomatic, >50% confined to bed  Vitals:   02/03/17 0942  BP: (!) 157/95  Pulse: 63  Resp: 18  Temp: 97.8 F (36.6 C)   Filed Weights   02/03/17 0942  Weight: 131 lb 3.2 oz (59.5 kg)    GENERAL: Thin build cachectic-appearing male patient accompanied by  his wife. Alert, no distress and comfortable.   His walking by himself. EYES: no pallor or icterus OROPHARYNX: Poor dentition NECK: supple, no masses felt LYMPH:  no palpable lymphadenopathy in the cervical, axillary or inguinal regions LUNGS: Positive for crackles bilateral lung bases. HEART/CVS: regular rate & rhythm and no murmurs; No lower extremity edema ABDOMEN: abdomen soft, non-tender and normal bowel sounds; colostomy bag. Musculoskeletal:no cyanosis of digits and no clubbing  PSYCH: alert & oriented x 3 with fluent speech NEURO: no focal motor/sensory deficits SKIN:  no rashes or significant lesions  LABORATORY DATA:  I have reviewed the data as listed Lab Results  Component Value Date   WBC 16.9 (H) 02/03/2017   HGB 13.5 02/03/2017   HCT 40.4 02/03/2017   MCV 95.5 02/03/2017   PLT 232 02/03/2017    Recent Labs  02/05/16 1007 02/03/17 0929  NA 139 137  K 3.4* 3.3*  CL 106 104  CO2 30 26  GLUCOSE 110* 118*  BUN 32* 29*  CREATININE 1.78* 2.07*  CALCIUM 9.4 9.1  GFRNONAA 32* 26*  GFRAA 37* 31*  PROT 6.8 5.9*  ALBUMIN 3.8 3.5  AST 23 18  ALT 18 11*  ALKPHOS 57 64  BILITOT 0.8 0.9     ASSESSMENT & PLAN:   MGUS (monoclonal gammopathy of unknown significance) # MGUS- Incidental M protein 0.2 g/dL September 2016. kappa lambda light ratio- normal. Myeloma numbers- October 2017 negative for M protein normal Lambda light chain condition. M protein from today is pending. Normal hemoglobin and calcium levels.  # Chronic kidney disease- creatinine today 1.7-2.0; [follow up with Dr.Lateef]  # Mild leukocytosis/neutrophilia- likely reactive.  #  Given patient's multiple comorbidities/age/MGUS- follow up as needed. Discussed with patient and family- if he continues to do clinically well- then recommend checking M protein levels/ on a yearly basis. Patient wants limit his doctors visits/ I think this is reasonable. He will continue to follow with nephrology/PCP.  Follow-up with [place] only as needed.   CC: Dr.Lateef.       Cammie Sickle, MD 02/03/2017 1:07 PM

## 2017-02-03 NOTE — Progress Notes (Signed)
Patient here for follow-up (h/o mgus). Pt c/o breast tenderness. pt states that his pcp took him off of finasteride. Breast tenderness has improved since stopping finasteride.

## 2017-02-04 LAB — KAPPA/LAMBDA LIGHT CHAINS
Kappa free light chain: 39.9 mg/L — ABNORMAL HIGH (ref 3.3–19.4)
Kappa, lambda light chain ratio: 1.33 (ref 0.26–1.65)
Lambda free light chains: 29.9 mg/L — ABNORMAL HIGH (ref 5.7–26.3)

## 2017-02-06 LAB — MULTIPLE MYELOMA PANEL, SERUM
ALBUMIN SERPL ELPH-MCNC: 3.3 g/dL (ref 2.9–4.4)
ALPHA2 GLOB SERPL ELPH-MCNC: 0.6 g/dL (ref 0.4–1.0)
Albumin/Glob SerPl: 1.6 (ref 0.7–1.7)
Alpha 1: 0.3 g/dL (ref 0.0–0.4)
B-GLOBULIN SERPL ELPH-MCNC: 0.7 g/dL (ref 0.7–1.3)
Gamma Glob SerPl Elph-Mcnc: 0.6 g/dL (ref 0.4–1.8)
Globulin, Total: 2.2 g/dL (ref 2.2–3.9)
IGM, SERUM: 23 mg/dL (ref 15–143)
IgA: 105 mg/dL (ref 61–437)
IgG (Immunoglobin G), Serum: 642 mg/dL — ABNORMAL LOW (ref 700–1600)
M Protein SerPl Elph-Mcnc: 0.1 g/dL — ABNORMAL HIGH
TOTAL PROTEIN ELP: 5.5 g/dL — AB (ref 6.0–8.5)

## 2017-02-24 ENCOUNTER — Other Ambulatory Visit: Payer: Self-pay | Admitting: Urology

## 2017-02-24 DIAGNOSIS — R339 Retention of urine, unspecified: Secondary | ICD-10-CM

## 2017-03-11 NOTE — Progress Notes (Signed)
1:51 PM   Jesse Macias 1923-11-02 [id number]  Referring provider: No referring provider defined for this encounter.  Chief Complaint  Patient presents with  . Benign Prostatic Hypertrophy    1 year follow up    HPI: Patient is a 83 year old Caucasian male with a history of urinary retention and BPH with LUTS who presents today for 12 month follow-up.  History of urinary retention Patient is not experiencing symptoms of urinary retention.   He will continue tamsulosin 0.4 mg.  BPH WITH LUTS His IPSS score today is 2, which is mild lower urinary tract symptomatology. He is pleased with his quality life due to his urinary symptoms.  His previous I PSS score was 2/2.  His previous PVR is 9 mL.  He denies any dysuria, hematuria or suprapubic pain.  He currently taking tamsulosin 0.4 mg daily.  He also denies any recent fevers, chills, nausea or vomiting.  He does not have a family history of PCa.  His finasteride was discontinued due to gynecomastia by his PCP.  His states his right breast is still sore and larger than the left breast.  He has been off the finasteride for 4 months.       IPSS    Row Name 03/12/17 1300         International Prostate Symptom Score   How often have you had the sensation of not emptying your bladder? Not at All     How often have you had to urinate less than every two hours? Not at All     How often have you found you stopped and started again several times when you urinated? Not at All     How often have you found it difficult to postpone urination? Not at All     How often have you had a weak urinary stream? Not at All     How often have you had to strain to start urination? Not at All     How many times did you typically get up at night to urinate? 2 Times     Total IPSS Score 2       Quality of Life due to urinary symptoms   If you were to spend the rest of your life with your urinary condition just the way it is now how would you feel about that?  Pleased        Score:  1-7 Mild 8-19 Moderate 20-35 Severe  PMH: Past Medical History:  Diagnosis Date  . Anemia    anemia of chronic renal disease  . Chronic kidney disease    acute renal failure, chronic kidney disease stage III  . Diabetes mellitus without complication ([place])   . Diverticulitis   . Glaucoma   . History of hiatal hernia   . Hyperlipemia   . Hypertension   . Neuromuscular disorder ([place])    peripheral neuropathy    Surgical History: Past Surgical History:  Procedure Laterality Date  . BACK SURGERY    . CENTRAL VENOUS CATHETER INSERTION N/A 06/09/2015   Procedure: INSERTION CENTRAL LINE ADULT;  Surgeon: Florene Glen, MD;  Location: [facility];  Service: General;  Laterality: N/A;  . COLECTOMY WITH COLOSTOMY CREATION/HARTMANN PROCEDURE N/A 06/09/2015   Procedure: COLECTOMY WITH COLOSTOMY CREATION/HARTMANN PROCEDURE;  Surgeon: Florene Glen, MD;  Location: [facility];  Service: General;  Laterality: N/A;  . HERNIA REPAIR     Two    Home Medications:  Allergies as of 03/12/2017  Reactions   Finasteride Other (See Comments)   gynecomastia      Medication List       Accurate as of 03/12/17  1:51 PM. Always use your most recent med list.          apixaban 2.5 MG Tabs tablet Commonly known as:  ELIQUIS Take 1 tablet (2.5 mg total) by mouth 2 (two) times daily.   brimonidine 0.2 % ophthalmic solution Commonly known as:  ALPHAGAN Place 2 drops into both eyes daily.   dorzolamide 2 % ophthalmic solution Commonly known as:  TRUSOPT Place 2 drops into both eyes daily.   finasteride 5 MG tablet Commonly known as:  PROSCAR TAKE 1 TABLET DAILY   latanoprost 0.005 % ophthalmic solution Commonly known as:  XALATAN Place 2 drops into both eyes daily.   losartan-hydrochlorothiazide 100-12.5 MG tablet Commonly known as:  HYZAAR Take 1 tablet by mouth daily.   metoprolol 200 MG 24 hr tablet Commonly known as:  TOPROL XL Take 1 tablet (200 mg  total) by mouth daily.   metoprolol 200 MG 24 hr tablet Commonly known as:  TOPROL-XL Take by mouth.   tamsulosin 0.4 MG Caps capsule Commonly known as:  FLOMAX TAKE 1 CAPSULE DAILY   timolol 0.5 % ophthalmic solution Commonly known as:  TIMOPTIC   V-R VITAMIN B-12 500 MCG tablet Generic drug:  cyanocobalamin Take by mouth.       Allergies:  Allergies  Allergen Reactions  . Finasteride Other (See Comments)    gynecomastia    Family History: Family History  Problem Relation Age of Onset  . Diabetes Mother   . Diverticulitis Mother   . Kidney disease Neg Hx   . Prostate cancer Neg Hx   . Kidney cancer Neg Hx   . Bladder Cancer Neg Hx     Social History:  reports that he has never smoked. He has never used smokeless tobacco. He reports that he does not drink alcohol or use drugs.  ROS: UROLOGY Frequent Urination?: No Hard to postpone urination?: No Burning/pain with urination?: No Get up at night to urinate?: Yes Leakage of urine?: No Urine stream starts and stops?: No Trouble starting stream?: No Do you have to strain to urinate?: No Blood in urine?: No Urinary tract infection?: No Sexually transmitted disease?: No Injury to kidneys or bladder?: No Painful intercourse?: No Weak stream?: No Erection problems?: No Penile pain?: No  Gastrointestinal Nausea?: No Vomiting?: No Indigestion/heartburn?: No Diarrhea?: No Constipation?: No  Constitutional Fever: No Night sweats?: No Weight loss?: No Fatigue?: No  Skin Skin rash/lesions?: No Itching?: No  Eyes Blurred vision?: Yes Double vision?: No  Ears/Nose/Throat Sore throat?: No Sinus problems?: No  Hematologic/Lymphatic Swollen glands?: No Easy bruising?: Yes  Cardiovascular Leg swelling?: No Chest pain?: No  Respiratory Cough?: No Shortness of breath?: No  Endocrine Excessive thirst?: No  Musculoskeletal Back pain?: No Joint pain?: No  Neurological Headaches?:  No Dizziness?: No  Psychologic Depression?: No Anxiety?: No  Physical Exam: BP 115/71   Pulse 65   Ht 5\' 5"  (1.651 m)   Wt 132 lb 14.4 oz (60.3 kg)   BMI 22.12 kg/m   Constitutional: Well nourished. Alert and oriented, No acute distress. HEENT: [place] AT, moist mucus membranes. Trachea midline, no masses. Cardiovascular: No clubbing, cyanosis, or edema. Respiratory: Normal respiratory effort, no increased work of breathing. GI: Abdomen is soft, non tender, non distended, no abdominal masses. Liver and spleen not palpable.  No hernias appreciated.  Stool sample for occult testing is not indicated.   GU: No CVA tenderness.  No bladder fullness or masses.  Patient with uncircumcised phallus. Foreskin easily retracted  Urethral meatus is patent.  No penile discharge. No penile lesions or rashes. Scrotum without lesions, cysts, rashes and/or edema.  Testicles are located scrotally bilaterally. No masses are appreciated in the testicles. Left and right epididymis are normal. Rectal: Patient with  normal sphincter tone. Anus and perineum without scarring or rashes. No rectal masses are appreciated. Prostate is approximately 60 grams, no nodules are appreciated. Seminal vesicles are normal. Skin: No rashes, bruises or suspicious lesions.  Right breast is firm under the areola and tender.  No axilla nodes present.   Lymph: No cervical or inguinal adenopathy. Neurologic: Grossly intact, no focal deficits, moving all 4 extremities. Psychiatric: Normal mood and affect.    Assessment & Plan:    1. BPH with LUTS- IPSS 2/1.  patient reports that he is very pleased with current management of urinary symptoms. Will continue Flomax We will follow up in 12 months for IPSS score, PVR and exam.    2. Gynecomastia/breast tenderness  - schedule mammogram and ultrasound  2. History of urinary retention  - will continue to monitor  Return in about 1 year (around 03/12/2018) for IPSS and exam.  These  notes generated with voice recognition software. I apologize for typographical errors.  Zara Council, [facility] [address], [place],  [address] [phone]

## 2017-03-12 ENCOUNTER — Encounter: Payer: Self-pay | Admitting: Urology

## 2017-03-12 ENCOUNTER — Ambulatory Visit (INDEPENDENT_AMBULATORY_CARE_PROVIDER_SITE_OTHER): Payer: Medicare Other | Admitting: Urology

## 2017-03-12 VITALS — BP 115/71 | HR 65 | Ht 65.0 in | Wt 132.9 lb

## 2017-03-12 DIAGNOSIS — N401 Enlarged prostate with lower urinary tract symptoms: Secondary | ICD-10-CM | POA: Diagnosis not present

## 2017-03-12 DIAGNOSIS — N138 Other obstructive and reflux uropathy: Secondary | ICD-10-CM

## 2017-03-12 DIAGNOSIS — Z87898 Personal history of other specified conditions: Secondary | ICD-10-CM | POA: Diagnosis not present

## 2017-03-12 DIAGNOSIS — N62 Hypertrophy of breast: Secondary | ICD-10-CM

## 2017-04-20 ENCOUNTER — Telehealth: Payer: Self-pay | Admitting: General Practice

## 2017-04-20 NOTE — Telephone Encounter (Signed)
Returned phone call to patient at this time. Spoke with patient's wife. She states that beginning approximately 1 month ago patient began having what sounds like open ulcerated areas, under the barrier ring. She states that it is different/new areas each time that she removes the barrier ring. The patient says that they are not painful nor do they itch. No bleeding from these sites either per patient's wife.  Currently: cleaning ostomy every 4 days, drying thoroughly, using protective barrier wipes, and applying 2 piece system.  Denies dietary or medication changes. Denies changes in ostomy supplies. Has not changed the frequency of changing the appliance.  I explained to patient's wife that we would like to see him in the office tomorrow with a change of his ostomy to evaluate what is going on and if there is something that we might be able to do to help. She verbalizes understanding. Patient placed on schedule with Dr. Burt Knack tomorrow- operating surgeon.

## 2017-04-20 NOTE — Telephone Encounter (Addendum)
Patient's wife is calling patient had surgery done on 08/22/2015 with Dr.Cooper he had a Diverticulitis of colon without hemorrhage, he has a colostomy bag that the patients wife changes, but she has notice recently sores after she takes the bag off once a week and changes the bag everyday. Please call patient and advice.

## 2017-04-21 ENCOUNTER — Ambulatory Visit (INDEPENDENT_AMBULATORY_CARE_PROVIDER_SITE_OTHER): Payer: Medicare Other | Admitting: Surgery

## 2017-04-21 ENCOUNTER — Encounter: Payer: Self-pay | Admitting: Surgery

## 2017-04-21 VITALS — BP 130/73 | HR 67 | Temp 97.8°F | Ht 63.0 in | Wt 128.0 lb

## 2017-04-21 DIAGNOSIS — K5732 Diverticulitis of large intestine without perforation or abscess without bleeding: Secondary | ICD-10-CM

## 2017-04-21 NOTE — Patient Instructions (Signed)
I will contact Domenic Moras, RN (ostomy Nurse) so she could go over your husband's ostomy supplies to prevent ulcers on his ostomy site.

## 2017-04-21 NOTE — Progress Notes (Signed)
Outpatient Surgical Follow Up  04/21/2017  Jesse Macias is an 83 y.o. male.   CC: Ulcers around ostomy  HPI: This patient status post a Hartman's procedure several years ago for diverticulitis. He was never reversed due to his elderly age, debilitated status, and medical problems. His wife brought him in because she has noticing ulcers underneath the adhesive ring of the ostomy appliance. It does not cause him any pain and the ostomy is otherwise quite functional and normal.  Past Medical History:  Diagnosis Date  . Anemia    anemia of chronic renal disease  . Chronic kidney disease    acute renal failure, chronic kidney disease stage III  . Diabetes mellitus without complication ([place])   . Diverticulitis   . Glaucoma   . History of hiatal hernia   . Hyperlipemia   . Hypertension   . Neuromuscular disorder ([place])    peripheral neuropathy    Past Surgical History:  Procedure Laterality Date  . BACK SURGERY    . CENTRAL VENOUS CATHETER INSERTION N/A 06/09/2015   Procedure: INSERTION CENTRAL LINE ADULT;  Surgeon: Florene Glen, MD;  Location: [facility] ORS;  Service: General;  Laterality: N/A;  . COLECTOMY WITH COLOSTOMY CREATION/HARTMANN PROCEDURE N/A 06/09/2015   Procedure: COLECTOMY WITH COLOSTOMY CREATION/HARTMANN PROCEDURE;  Surgeon: Florene Glen, MD;  Location: [facility] ORS;  Service: General;  Laterality: N/A;  . HERNIA REPAIR     Two    Family History  Problem Relation Age of Onset  . Diabetes Mother   . Diverticulitis Mother   . Kidney disease Neg Hx   . Prostate cancer Neg Hx   . Kidney cancer Neg Hx   . Bladder Cancer Neg Hx     Social History:  reports that he has never smoked. He has never used smokeless tobacco. He reports that he does not drink alcohol or use drugs.  Allergies:  Allergies  Allergen Reactions  . Finasteride Other (See Comments)    gynecomastia    Medications reviewed.   Review of Systems:   Review of Systems  Constitutional:  Negative.   HENT: Negative.   Eyes: Negative.   Respiratory: Negative.   Cardiovascular: Negative.   Gastrointestinal: Negative.   Genitourinary: Negative.   Musculoskeletal: Negative.   Skin: Negative.   Neurological: Negative.   Endo/Heme/Allergies: Negative.   Psychiatric/Behavioral: Negative.      Physical Exam:  BP 130/73   Pulse 67   Temp 97.8 F (36.6 C) (Oral)   Ht 5\' 3"  (1.6 m)   Wt 128 lb (58.1 kg)   BMI 22.67 kg/m   Physical Exam  Constitutional:  Elderly cachectic and weak male patient  HENT:  Head: Normocephalic and atraumatic.  Eyes: Pupils are equal, round, and reactive to light. Right eye exhibits no discharge. Left eye exhibits no discharge. No scleral icterus.  Pulmonary/Chest: Effort normal. No respiratory distress.  Abdominal: Soft. He exhibits no distension. There is no tenderness. There is no rebound and no guarding.  2 small areas of ulceration beneath the appliance adhesive. This is not related to the mucosa of the ostomy. It is remote from the ostomy. Silver nitrate is applied. It is quite superficial when probing with the silver nitrate swab. There is no sign of fistula formation. The area around the ostomy is somewhat flaccid suggesting a possible parastomal hernia but there is none palpable as the patient's abdominal wall is very weak in general. The area is completely nontender  Vitals reviewed.  No results found for this or any previous visit (from the past 48 hour(s)). No results found.  Assessment/Plan:  2 separate areas of ulceration beneath the appliance adhesive ring. I applied silver nitrate to the 2 areas which are quite superficial. I believe that the patient would benefit from seeing an ostomy nurse as they often have methods to correct these very types of problems. I will last to come back and see me in 2-3 weeks as I could always retreat this with silver nitrate but I believe the expertise 7 ostomy nurse would be greatly  enhancing in this patient's condition at this time. No plans for placing him on the surgical list for colostomy reversal as he remains quite weak.  Florene Glen, MD, FACS

## 2017-04-27 ENCOUNTER — Telehealth: Payer: Self-pay

## 2017-04-27 NOTE — Telephone Encounter (Signed)
Jesse Macias called wanting to know when Jesse Moras, RN was going to get in contact her. I told her that I would send [name] another e-mail to ask when she could get in contact with her.

## 2017-05-12 NOTE — Telephone Encounter (Signed)
Domenic Moras, RN have been in contact with the patient and his wife in reference to their concerns.

## 2017-05-13 ENCOUNTER — Encounter: Payer: Self-pay | Admitting: Surgery

## 2017-05-13 ENCOUNTER — Ambulatory Visit (INDEPENDENT_AMBULATORY_CARE_PROVIDER_SITE_OTHER): Payer: Medicare Other | Admitting: Surgery

## 2017-05-13 VITALS — BP 120/68 | HR 55 | Temp 97.5°F | Ht 63.0 in | Wt 127.2 lb

## 2017-05-13 DIAGNOSIS — K5732 Diverticulitis of large intestine without perforation or abscess without bleeding: Secondary | ICD-10-CM

## 2017-05-13 NOTE — Progress Notes (Signed)
Outpatient Surgical Follow Up  05/13/2017  Jesse Macias is an 83 y.o. male.   WI:OMBTDH site irritation  HPI: This patient seen previously with some ostomy site irritation. He was sent to the ostomy nurse. The ostomy nurse had given the patient some material possibly Karaya but the patient and his wife do not know what it was. They state that while they were using it it was working and the wounds were healing well but then they ran out of the material and the wounds came back. No other problems noted. Past Medical History:  Diagnosis Date  . Anemia    anemia of chronic renal disease  . Chronic kidney disease    acute renal failure, chronic kidney disease stage III  . Diabetes mellitus without complication ([place])   . Diverticulitis   . Glaucoma   . History of hiatal hernia   . Hyperlipemia   . Hypertension   . Neuromuscular disorder ([place])    peripheral neuropathy    Past Surgical History:  Procedure Laterality Date  . BACK SURGERY    . CENTRAL VENOUS CATHETER INSERTION N/A 06/09/2015   Procedure: INSERTION CENTRAL LINE ADULT;  Surgeon: Florene Glen, MD;  Location: [facility];  Service: General;  Laterality: N/A;  . COLECTOMY WITH COLOSTOMY CREATION/HARTMANN PROCEDURE N/A 06/09/2015   Procedure: COLECTOMY WITH COLOSTOMY CREATION/HARTMANN PROCEDURE;  Surgeon: Florene Glen, MD;  Location: [facility];  Service: General;  Laterality: N/A;  . HERNIA REPAIR     Two    Family History  Problem Relation Age of Onset  . Diabetes Mother   . Diverticulitis Mother   . Kidney disease Neg Hx   . Prostate cancer Neg Hx   . Kidney cancer Neg Hx   . Bladder Cancer Neg Hx     Social History:  reports that he has never smoked. He has never used smokeless tobacco. He reports that he does not drink alcohol or use drugs.  Allergies:  Allergies  Allergen Reactions  . Finasteride Other (See Comments)    gynecomastia    Medications reviewed.   Review of Systems:   Review of Systems   Constitutional: Negative.   HENT: Negative.   Eyes: Negative.   Respiratory: Negative.   Cardiovascular: Negative.   Gastrointestinal: Negative for abdominal pain, nausea and vomiting.  Genitourinary: Negative.   Musculoskeletal: Negative.   Skin: Negative.   Neurological: Negative.   Endo/Heme/Allergies: Negative.   Psychiatric/Behavioral: Negative.      Physical Exam:  Ht 5\' 3"  (1.6 m)   Physical Exam  Constitutional: He is oriented to person, place, and time.  Elderly male patient in no acute distress  HENT:  Head: Normocephalic and atraumatic.  Eyes: Pupils are equal, round, and reactive to light. Right eye exhibits no discharge. Left eye exhibits no discharge. No scleral icterus.  Neck: Normal range of motion.  Pulmonary/Chest: Effort normal. No respiratory distress.  Abdominal: Soft. He exhibits no distension. There is no tenderness.  Brand-new ostomy bag in place no obvious bleeding ostomy bag appliance not removed because it is a brand-new ostomy bag and ring.  Lymphadenopathy:    He has no cervical adenopathy.  Neurological: He is alert and oriented to person, place, and time.  Vitals reviewed.     No results found for this or any previous visit (from the past 48 hour(s)). No results found.  Assessment/Plan:  This patient with some ulceration around the colostomy. They had been sent to the ostomy nurse were given supplies  for a short period of time but did not reorder them for some reason. While they were utilizing those supplies, which are not known to me or to the patient, the wound seemed to heal or nearly go away. Since they ran out of the material the wounds of come back but not very bad. I offered to take the ostomy appliance off but it is a brand-new appliance and there is little that I would have to add to this situation that has been remedied had they been given enough supplies or reorder the supplies appropriately. I suggested that they make an  appointment to follow up with the ostomy nurse and we will call the ostomy nurse and ensure that we are providing the best materials for the patient. He'll follow-up with me on an as-needed basis  Florene Glen, MD, FACS

## 2017-05-13 NOTE — Patient Instructions (Signed)
We will contact Domenic Moras and find out what the material was that you are needing.  Please contact her for an appointment as well.

## 2017-05-21 ENCOUNTER — Telehealth: Payer: Self-pay

## 2017-05-21 NOTE — Telephone Encounter (Signed)
Patient's wife call stating that her supplies for her husbands ostomy bag supplies have been placed on hold due to needing approval from Dr. Burt Knack. She gave the number to call to see if I could get approval. I told her that I would try to contact the [place] office to get this approved. Patient verbalized understanding.   [place]ris at this time to get approval for the ostomy bag supplies. I was informed that the nurse Domenic Moras did not have a specific certification in order to get the supplies approved. I was asked for Dr. York Cerise NPI# in order for him to give further approval for the patient supplies. I was told that they will be faxing over paper work for use to fax back as to keep these orders in place. I gave verbal approval for this order on behalf of Dr. Burt Knack at this time and was told that they will begin processing the order for release. I will contact the patient with this information.  Call made to Mrs. Britta Mccreedy at this time to let her know that I have contacted [facility]. I informed her that the are working on releasing the order for the supplies. She was beyond grateful and happy. I also told her now that Dr. Antionette Char name is on the order that any of the nurses here can give verbal order on his behalf for future supplies. Patient verbalized understanding.

## 2017-08-16 IMAGING — CT CT ABD-PELV W/ CM
2 of 5 series · 15 of 46 positions shown, 17 images · IV contrast (omnipaque)
Comparison: CT abdomen pelvis 06/16/2015.

CLINICAL DATA: Diverticulitis of the large intestine. Status post
sigmoid colectomy with and colostomy. Persistent suprapubic pressure
in difficulty with urination. Dysuria.

EXAM:
CT ABDOMEN AND PELVIS WITH CONTRAST
TECHNIQUE: Multidetector CT imaging of the abdomen and pelvis was performed
using the standard protocol following bolus administration of
intravenous contrast.
CONTRAST:  75mL OMNIPAQUE IOHEXOL 300 MG/ML  SOLN

[Series 2: routine abd pel with · axial · 0.69mm/px · z∈[-500,-84]mm · 12 of 93 slices shown, 14 images]
[im 5/93  soft-tissue]
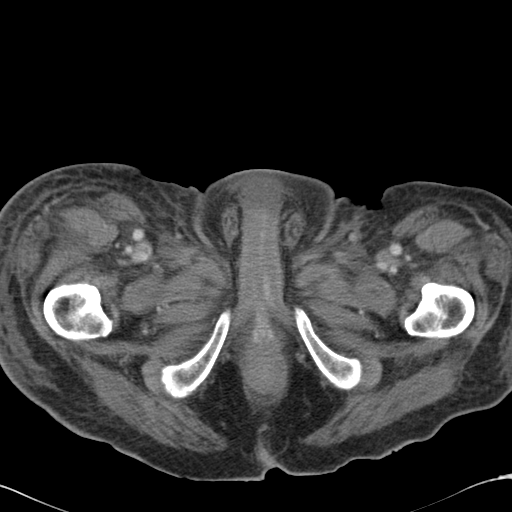
[im 5/93  bone]
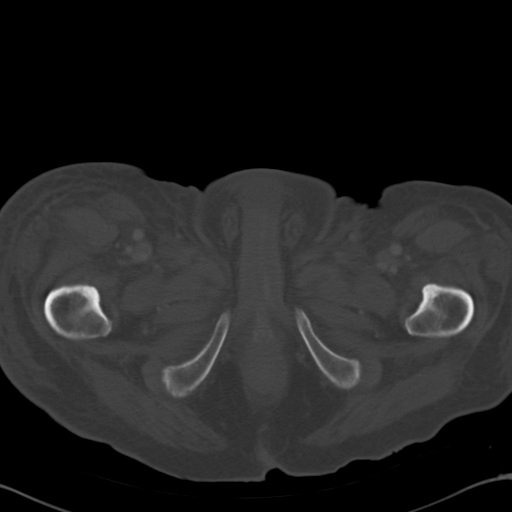
[im 15/93  soft-tissue]
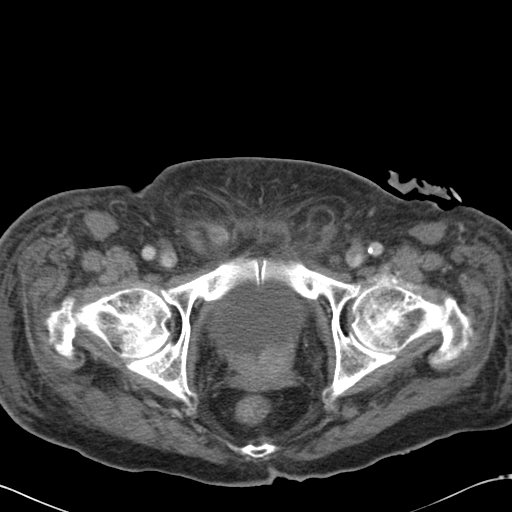
[im 20/93  soft-tissue]
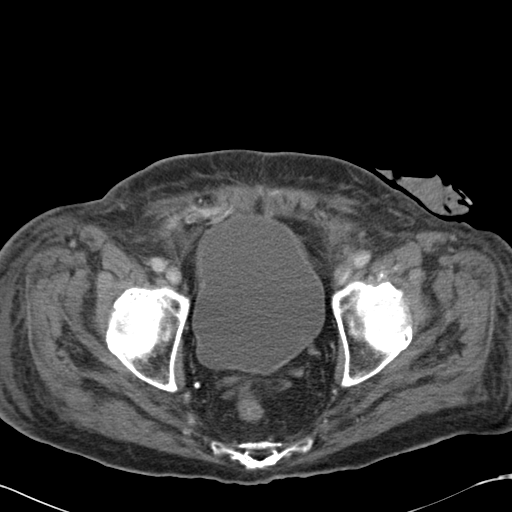
[im 30/93  soft-tissue]
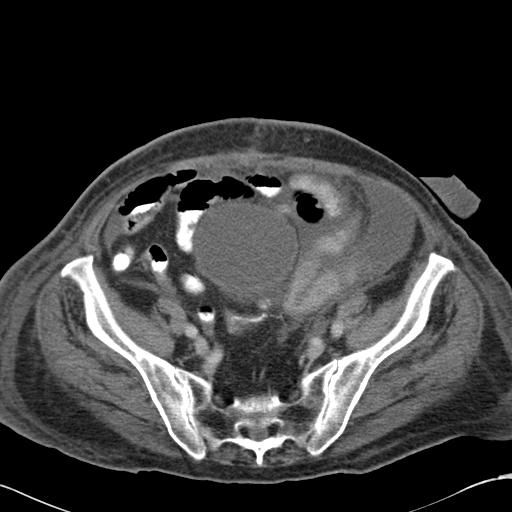
[im 34/93  soft-tissue]
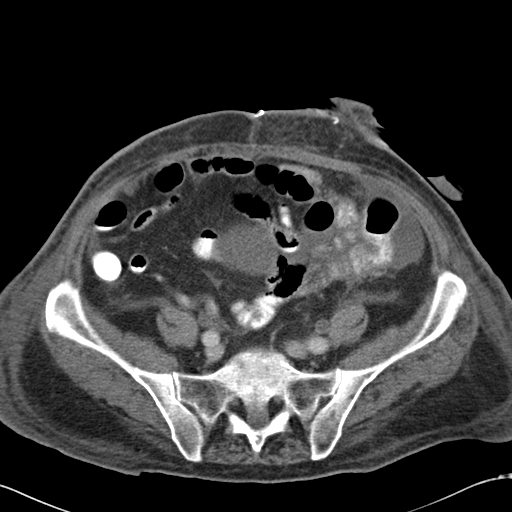
[im 44/93  soft-tissue]
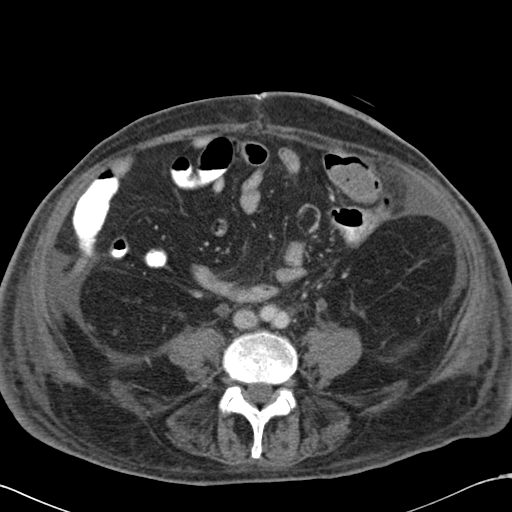
[im 49/93  soft-tissue]
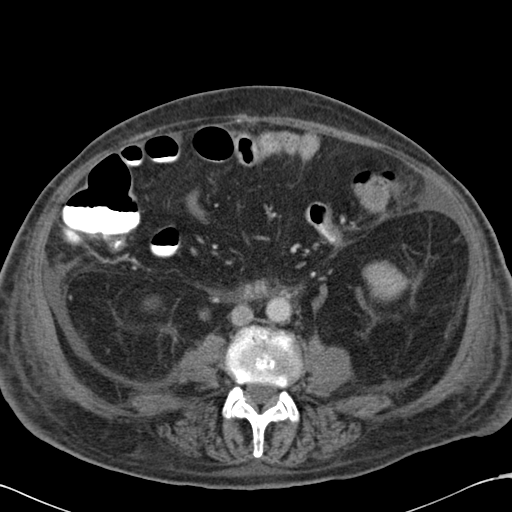
[im 59/93  soft-tissue]
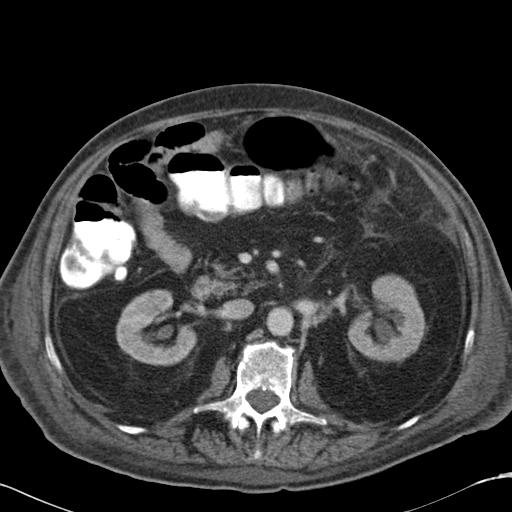
[im 63/93  soft-tissue]
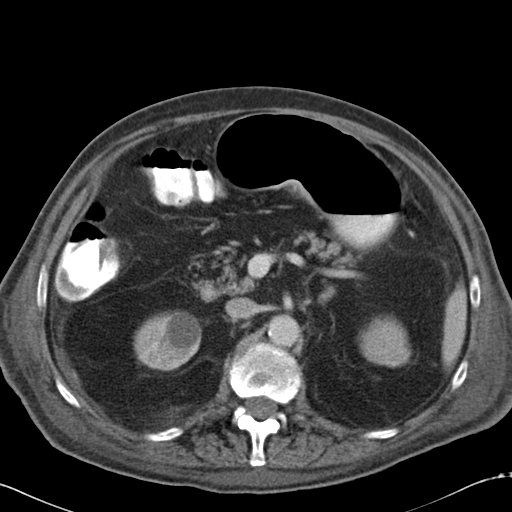
[im 63/93  bone]
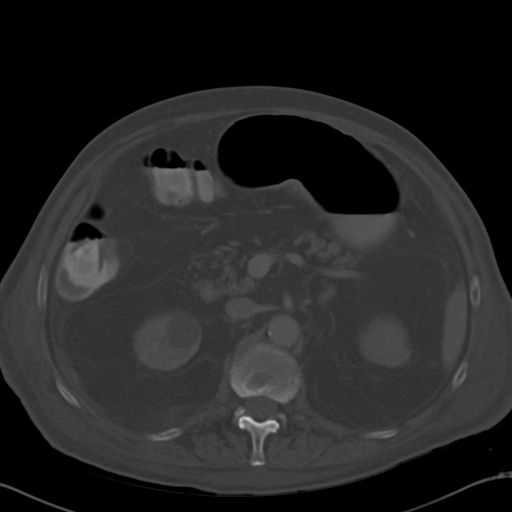
[im 73/93  soft-tissue]
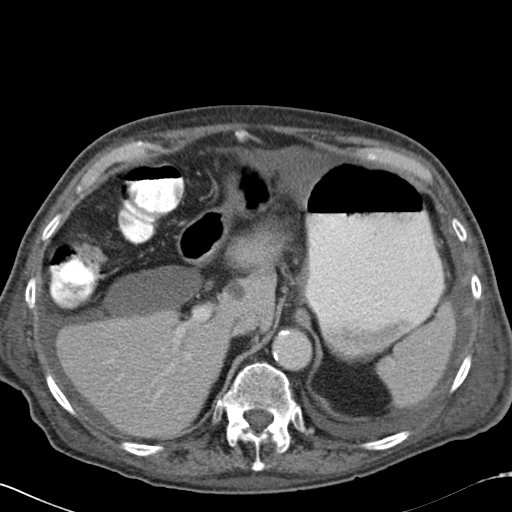
[im 78/93  soft-tissue]
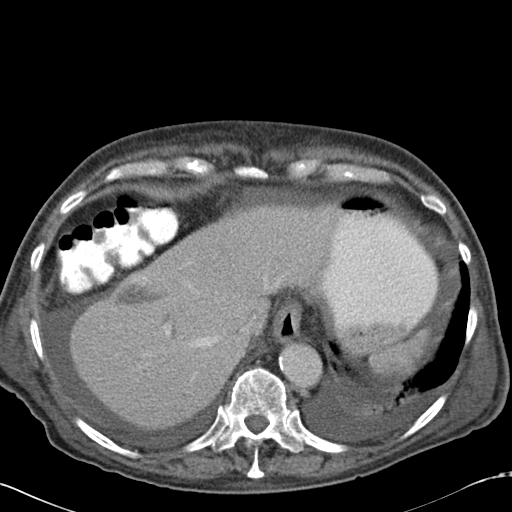
[im 88/93  soft-tissue]
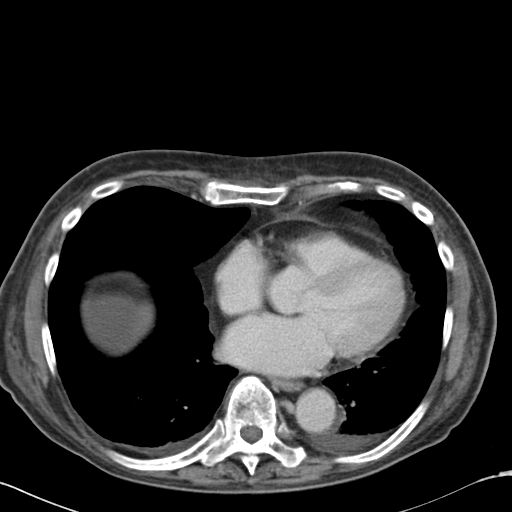

[Series 5: cor routine abd pel with · coronal · 0.71mm/px · 3 of 137 slices shown]
[im 46/137  soft-tissue]
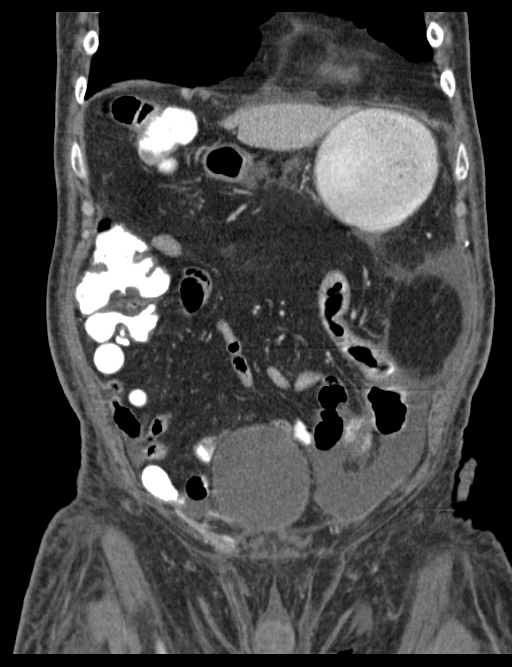
[im 61/137  soft-tissue]
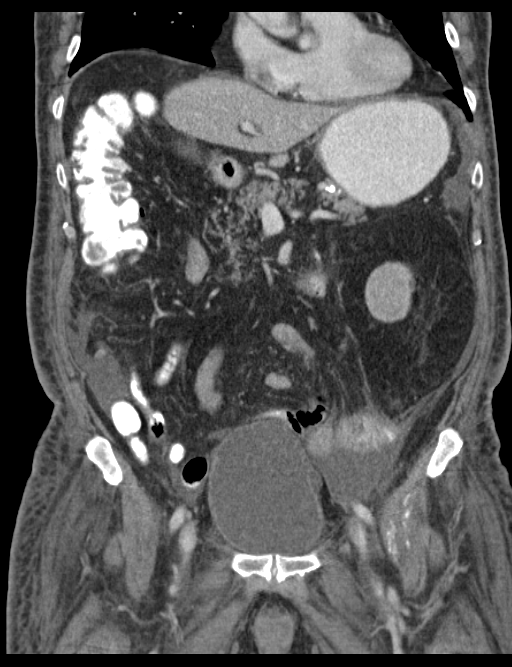
[im 76/137  soft-tissue]
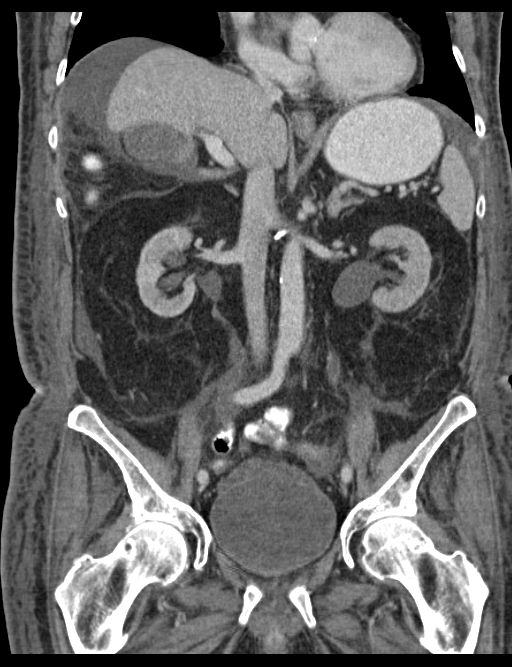

[15 of 46 positions shown; findings below may reference images not displayed]

FINDINGS: Bilateral pleural effusions are similar to the prior exam.
Associated atelectasis is noted.

Heart size is normal. Coronary artery calcifications are again
noted.

A simple cyst in the a caudate head of the liver measures 15 mm. The
liver is otherwise unremarkable. Spleen is within normal limits.
Abdominal ascites have increased with fluid around the free edge of
the liver and about the spleen.

The distal esophagus is mildly dilated. The stomach is moderately
distended as well. Gastric distention has increased. Inflammatory
changes about the third portion of the duodenum have increased.
There is diffuse fatty atrophy of the pancreas.

The common bile duct and gallbladder are within normal limits. The
adrenal glands are normal. A 2.8 cm cyst at the lower pole of the
right kidney is stable.

Mild bilateral hydronephrosis is new. The ureters are dilated into
the anatomic pelvis.

Ascites within the anatomic pelvis has increased. There is fluid
along the pericolic gutter bilaterally. No discrete peripherally
enhancing abscess is evident. Persistent inflammatory changes are
noted at the site of previous perforation. There is focal dilation
of the small bowel which represents proximal jejunum at this level
as well. This suggests there is an inflammatory component at this
level, near the colostomy site. Oral contrast is well seen into the
residual right-sided colon to the colostomy site.

The urinary bladder is moderately dilated. The prostate is enlarged.

Bone windows demonstrate Schmorl's nodes. No focal lytic or blastic
lesions are present.
IMPRESSION: 1. Increased abdominal ascites, still most prominent in the left
lower quadrant.
2. Focal dilation of small bowel at the site of previous
perforation. The duodenum is not and enlarged but is inflamed
proximal to this.
3. Contrast is present throughout nondilated small bowel and the
residual proximal colon.
4. The area focal bowel inflammation is again seen in the left lower
quadrant near the ostomy site. There is no definite free air.
5. New mild to moderate hydronephrosis is present bilaterally.
6. Stable small bilateral pleural effusions and associated
atelectasis.

## 2018-02-21 ENCOUNTER — Other Ambulatory Visit: Payer: Self-pay | Admitting: Urology

## 2018-02-21 DIAGNOSIS — R339 Retention of urine, unspecified: Secondary | ICD-10-CM

## 2018-02-22 ENCOUNTER — Other Ambulatory Visit: Payer: Self-pay | Admitting: Urology

## 2018-02-22 DIAGNOSIS — R339 Retention of urine, unspecified: Secondary | ICD-10-CM

## 2018-02-22 MED ORDER — TAMSULOSIN HCL 0.4 MG PO CAPS: 0.4000 mg | ORAL_CAPSULE | Freq: Every day | ORAL | 3 refills | Status: DC

## 2018-02-22 NOTE — Progress Notes (Signed)
Script for tamsulosin sent to pharmacy.

## 2018-03-10 NOTE — Progress Notes (Unsigned)
9:38 PM   Jesse Macias Sep 15, 1924 [id number]  Referring provider: No referring provider defined for this encounter.  No chief complaint on file.   HPI: Patient is a 84 year old Caucasian male with a history of urinary retention and BPH with LUTS who presents today for 12 month follow-up.  History of urinary retention Patient is not experiencing symptoms of urinary retention.   He will continue tamsulosin 0.4 mg.  BPH WITH LUTS His IPSS score today is ***, which is *** lower urinary tract symptomatology. He is *** with his quality life due to his urinary symptoms.  His previous I PSS score was 2/2.  His previous PVR is 9 mL.  He denies any dysuria, hematuria or suprapubic pain.  He currently taking tamsulosin 0.4 mg daily.  He also denies any recent fevers, chills, nausea or vomiting.  He does not have a family history of PCa.  His finasteride was discontinued due to gynecomastia by his PCP.  His states his right breast is still sore and larger than the left breast.  He did not have a breast ultrasound.  ***   Score:  1-7 Mild 8-19 Moderate 20-35 Severe  PMH: Past Medical History:  Diagnosis Date  . Anemia    anemia of chronic renal disease  . Chronic kidney disease    acute renal failure, chronic kidney disease stage III  . Diabetes mellitus without complication ([place])   . Diverticulitis   . Glaucoma   . History of hiatal hernia   . Hyperlipemia   . Hypertension   . Neuromuscular disorder ([place])    peripheral neuropathy    Surgical History: Past Surgical History:  Procedure Laterality Date  . BACK SURGERY    . CENTRAL VENOUS CATHETER INSERTION N/A 06/09/2015   Procedure: INSERTION CENTRAL LINE ADULT;  Surgeon: Florene Glen, MD;  Location: [facility] ORS;  Service: General;  Laterality: N/A;  . COLECTOMY WITH COLOSTOMY CREATION/HARTMANN PROCEDURE N/A 06/09/2015   Procedure: COLECTOMY WITH COLOSTOMY CREATION/HARTMANN PROCEDURE;  Surgeon: Florene Glen, MD;  Location:  [facility] ORS;  Service: General;  Laterality: N/A;  . HERNIA REPAIR     Two    Home Medications:  Allergies as of 03/11/2018      Reactions   Finasteride Other (See Comments)   gynecomastia      Medication List        Accurate as of 03/10/18  9:38 PM. Always use your most recent med list.          apixaban 2.5 MG Tabs tablet Commonly known as:  ELIQUIS Take 1 tablet (2.5 mg total) by mouth 2 (two) times daily.   brimonidine 0.2 % ophthalmic solution Commonly known as:  ALPHAGAN Place 2 drops into both eyes daily.   dorzolamide 2 % ophthalmic solution Commonly known as:  TRUSOPT Place 2 drops into both eyes daily.   finasteride 5 MG tablet Commonly known as:  PROSCAR TAKE 1 TABLET DAILY   latanoprost 0.005 % ophthalmic solution Commonly known as:  XALATAN Place 2 drops into both eyes daily.   losartan-hydrochlorothiazide 100-12.5 MG tablet Commonly known as:  HYZAAR Take 1 tablet by mouth daily.   metoprolol 200 MG 24 hr tablet Commonly known as:  TOPROL-XL Take by mouth.   tamsulosin 0.4 MG Caps capsule Commonly known as:  FLOMAX Take 1 capsule (0.4 mg total) by mouth daily.   timolol 0.5 % ophthalmic solution Commonly known as:  TIMOPTIC   V-R VITAMIN B-12 500 MCG tablet Generic drug:  vitamin B-12 Take by mouth.       Allergies:  Allergies  Allergen Reactions  . Finasteride Other (See Comments)    gynecomastia    Family History: Family History  Problem Relation Age of Onset  . Diabetes Mother   . Diverticulitis Mother   . Kidney disease Neg Hx   . Prostate cancer Neg Hx   . Kidney cancer Neg Hx   . Bladder Cancer Neg Hx     Social History:  reports that he has never smoked. He has never used smokeless tobacco. He reports that he does not drink alcohol or use drugs.  ROS:                                        Physical Exam: There were no vitals taken for this visit.  Constitutional: Well nourished. Alert and  oriented, No acute distress. HEENT: [place] AT, moist mucus membranes. Trachea midline, no masses. Cardiovascular: No clubbing, cyanosis, or edema. Respiratory: Normal respiratory effort, no increased work of breathing. GI: Abdomen is soft, non tender, non distended, no abdominal masses. Liver and spleen not palpable.  No hernias appreciated.  Stool sample for occult testing is not indicated.   GU: No CVA tenderness.  No bladder fullness or masses.  Patient with uncircumcised phallus. Foreskin easily retracted  Urethral meatus is patent.  No penile discharge. No penile lesions or rashes. Scrotum without lesions, cysts, rashes and/or edema.  Testicles are located scrotally bilaterally. No masses are appreciated in the testicles. Left and right epididymis are normal. Rectal: Patient with  normal sphincter tone. Anus and perineum without scarring or rashes. No rectal masses are appreciated. Prostate is approximately 60 grams, no nodules are appreciated. Seminal vesicles are normal. Skin: No rashes, bruises or suspicious lesions.  Right breast is firm under the areola and tender.  No axilla nodes present.   Lymph: No cervical or inguinal adenopathy. Neurologic: Grossly intact, no focal deficits, moving all 4 extremities. Psychiatric: Normal mood and affect. ***  Constitutional: Well nourished. Alert and oriented, No acute distress. HEENT: [place] AT, moist mucus membranes. Trachea midline, no masses. Cardiovascular: No clubbing, cyanosis, or edema. Respiratory: Normal respiratory effort, no increased work of breathing. GI: Abdomen is soft, non tender, non distended, no abdominal masses. Liver and spleen not palpable.  No hernias appreciated.  Stool sample for occult testing is not indicated.   GU: No CVA tenderness.  No bladder fullness or masses.  Patient with circumcised/uncircumcised phallus. ***Foreskin easily retracted***  Urethral meatus is patent.  No penile discharge. No penile lesions or rashes.  Scrotum without lesions, cysts, rashes and/or edema.  Testicles are located scrotally bilaterally. No masses are appreciated in the testicles. Left and right epididymis are normal. Rectal: Patient with  normal sphincter tone. Anus and perineum without scarring or rashes. No rectal masses are appreciated. Prostate is approximately *** grams, *** nodules are appreciated. Seminal vesicles are normal. Skin: No rashes, bruises or suspicious lesions. Lymph: No cervical or inguinal adenopathy. Neurologic: Grossly intact, no focal deficits, moving all 4 extremities. Psychiatric: Normal mood and affect.     Assessment & Plan:    1. BPH with LUTS- IPSS 2/1.  patient reports that he is very pleased with current management of urinary symptoms. Will continue Flomax We will follow up in 12 months for IPSS score, PVR and exam.    2. Gynecomastia/breast tenderness  -  schedule mammogram and ultrasound  2. History of urinary retention  - will continue to monitor  No follow-ups on file.  These notes generated with voice recognition software. I apologize for typographical errors.  Zara Council, [facility] [address], [place], [place] [address] [phone]

## 2018-03-11 ENCOUNTER — Ambulatory Visit: Payer: Medicare Other | Admitting: Urology

## 2019-02-17 ENCOUNTER — Other Ambulatory Visit: Payer: Self-pay | Admitting: Urology

## 2019-02-17 DIAGNOSIS — R339 Retention of urine, unspecified: Secondary | ICD-10-CM

## 2020-01-22 ENCOUNTER — Encounter: Payer: Self-pay | Admitting: Emergency Medicine

## 2020-01-22 ENCOUNTER — Inpatient Hospital Stay
Admission: EM | Admit: 2020-01-22 | Discharge: 2020-01-26 | DRG: 389 | Disposition: A | Discharge status: Home Health | Payer: Medicare Other | Attending: Hospitalist | Admitting: Hospitalist

## 2020-01-22 ENCOUNTER — Other Ambulatory Visit: Payer: Self-pay

## 2020-01-22 ENCOUNTER — Inpatient Hospital Stay: Payer: Medicare Other

## 2020-01-22 ENCOUNTER — Emergency Department: Payer: Medicare Other

## 2020-01-22 DIAGNOSIS — H409 Unspecified glaucoma: Secondary | ICD-10-CM | POA: Diagnosis present

## 2020-01-22 DIAGNOSIS — D631 Anemia in chronic kidney disease: Secondary | ICD-10-CM | POA: Diagnosis present

## 2020-01-22 DIAGNOSIS — E1142 Type 2 diabetes mellitus with diabetic polyneuropathy: Secondary | ICD-10-CM | POA: Diagnosis present

## 2020-01-22 DIAGNOSIS — K449 Diaphragmatic hernia without obstruction or gangrene: Secondary | ICD-10-CM | POA: Diagnosis present

## 2020-01-22 DIAGNOSIS — I129 Hypertensive chronic kidney disease with stage 1 through stage 4 chronic kidney disease, or unspecified chronic kidney disease: Secondary | ICD-10-CM | POA: Diagnosis present

## 2020-01-22 DIAGNOSIS — R05 Cough: Secondary | ICD-10-CM | POA: Diagnosis not present

## 2020-01-22 DIAGNOSIS — K567 Ileus, unspecified: Secondary | ICD-10-CM | POA: Diagnosis present

## 2020-01-22 DIAGNOSIS — I10 Essential (primary) hypertension: Secondary | ICD-10-CM

## 2020-01-22 DIAGNOSIS — R112 Nausea with vomiting, unspecified: Secondary | ICD-10-CM | POA: Diagnosis not present

## 2020-01-22 DIAGNOSIS — I482 Chronic atrial fibrillation, unspecified: Secondary | ICD-10-CM

## 2020-01-22 DIAGNOSIS — E876 Hypokalemia: Secondary | ICD-10-CM | POA: Diagnosis present

## 2020-01-22 DIAGNOSIS — M7989 Other specified soft tissue disorders: Secondary | ICD-10-CM

## 2020-01-22 DIAGNOSIS — Z7901 Long term (current) use of anticoagulants: Secondary | ICD-10-CM | POA: Diagnosis not present

## 2020-01-22 DIAGNOSIS — H548 Legal blindness, as defined in USA: Secondary | ICD-10-CM | POA: Diagnosis present

## 2020-01-22 DIAGNOSIS — N401 Enlarged prostate with lower urinary tract symptoms: Secondary | ICD-10-CM | POA: Diagnosis present

## 2020-01-22 DIAGNOSIS — E785 Hyperlipidemia, unspecified: Secondary | ICD-10-CM | POA: Diagnosis present

## 2020-01-22 DIAGNOSIS — K56609 Unspecified intestinal obstruction, unspecified as to partial versus complete obstruction: Secondary | ICD-10-CM | POA: Diagnosis not present

## 2020-01-22 DIAGNOSIS — K219 Gastro-esophageal reflux disease without esophagitis: Secondary | ICD-10-CM | POA: Diagnosis present

## 2020-01-22 DIAGNOSIS — N138 Other obstructive and reflux uropathy: Secondary | ICD-10-CM | POA: Diagnosis present

## 2020-01-22 DIAGNOSIS — Z20822 Contact with and (suspected) exposure to covid-19: Secondary | ICD-10-CM | POA: Diagnosis present

## 2020-01-22 DIAGNOSIS — D472 Monoclonal gammopathy: Secondary | ICD-10-CM | POA: Diagnosis present

## 2020-01-22 DIAGNOSIS — E1122 Type 2 diabetes mellitus with diabetic chronic kidney disease: Secondary | ICD-10-CM | POA: Diagnosis present

## 2020-01-22 DIAGNOSIS — N184 Chronic kidney disease, stage 4 (severe): Secondary | ICD-10-CM | POA: Diagnosis present

## 2020-01-22 DIAGNOSIS — Z833 Family history of diabetes mellitus: Secondary | ICD-10-CM

## 2020-01-22 DIAGNOSIS — N390 Urinary tract infection, site not specified: Secondary | ICD-10-CM | POA: Diagnosis present

## 2020-01-22 DIAGNOSIS — Z79899 Other long term (current) drug therapy: Secondary | ICD-10-CM

## 2020-01-22 DIAGNOSIS — H9193 Unspecified hearing loss, bilateral: Secondary | ICD-10-CM | POA: Diagnosis present

## 2020-01-22 DIAGNOSIS — E860 Dehydration: Secondary | ICD-10-CM

## 2020-01-22 DIAGNOSIS — Z933 Colostomy status: Secondary | ICD-10-CM | POA: Diagnosis not present

## 2020-01-22 DIAGNOSIS — Z9049 Acquired absence of other specified parts of digestive tract: Secondary | ICD-10-CM

## 2020-01-22 DIAGNOSIS — Z888 Allergy status to other drugs, medicaments and biological substances status: Secondary | ICD-10-CM

## 2020-01-22 LAB — URINALYSIS, COMPLETE (UACMP) WITH MICROSCOPIC
Bilirubin Urine: NEGATIVE
Glucose, UA: NEGATIVE mg/dL
Ketones, ur: NEGATIVE mg/dL
Nitrite: NEGATIVE
Protein, ur: 30 mg/dL — AB
Specific Gravity, Urine: 1.014 (ref 1.005–1.030)
Squamous Epithelial / HPF: NONE SEEN (ref 0–5)
WBC, UA: >50 WBC/hpf — ABNORMAL HIGH (ref 0–5)
pH: 5 (ref 5.0–8.0)

## 2020-01-22 LAB — SARS CORONAVIRUS 2 (TAT 6-24 HRS): SARS Coronavirus 2: NEGATIVE

## 2020-01-22 LAB — COMPREHENSIVE METABOLIC PANEL
ALT: 10 U/L (ref 0–44)
AST: 15 U/L (ref 15–41)
Albumin: 3.9 g/dL (ref 3.5–5.0)
Alkaline Phosphatase: 57 U/L (ref 38–126)
Anion gap: 10 (ref 5–15)
BUN: 37 mg/dL — ABNORMAL HIGH (ref 8–23)
CO2: 24 mmol/L (ref 22–32)
Calcium: 10.3 mg/dL (ref 8.9–10.3)
Chloride: 107 mmol/L (ref 98–111)
Creatinine, Ser: 2.14 mg/dL — ABNORMAL HIGH (ref 0.61–1.24)
GFR calc Af Amer: 30 mL/min — ABNORMAL LOW (ref >60)
GFR calc non Af Amer: 26 mL/min — ABNORMAL LOW (ref >60)
Glucose, Bld: 167 mg/dL — ABNORMAL HIGH (ref 70–99)
Potassium: 3.4 mmol/L — ABNORMAL LOW (ref 3.5–5.1)
Sodium: 141 mmol/L (ref 135–145)
Total Bilirubin: 1.5 mg/dL — ABNORMAL HIGH (ref 0.3–1.2)
Total Protein: 6.9 g/dL (ref 6.5–8.1)

## 2020-01-22 LAB — LIPASE, BLOOD: Lipase: 21 U/L (ref 11–51)

## 2020-01-22 LAB — POC SARS CORONAVIRUS 2 AG -  ED: SARS Coronavirus 2 Ag: NEGATIVE

## 2020-01-22 LAB — CBC
HCT: 43.6 % (ref 39.0–52.0)
Hemoglobin: 14.7 g/dL (ref 13.0–17.0)
MCH: 32 pg (ref 26.0–34.0)
MCHC: 33.7 g/dL (ref 30.0–36.0)
MCV: 94.8 fL (ref 80.0–100.0)
Platelets: 187 10*3/uL (ref 150–400)
RBC: 4.6 MIL/uL (ref 4.22–5.81)
RDW: 13.7 % (ref 11.5–15.5)
WBC: 9.7 10*3/uL (ref 4.0–10.5)
nRBC: 0 % (ref 0.0–0.2)

## 2020-01-22 LAB — GLUCOSE, CAPILLARY: Glucose-Capillary: 112 mg/dL — ABNORMAL HIGH (ref 70–99)

## 2020-01-22 MED ORDER — MORPHINE SULFATE (PF) 2 MG/ML IV SOLN: 0.5000 mg | INTRAVENOUS | Status: DC | PRN

## 2020-01-22 MED ORDER — SODIUM CHLORIDE 0.9 % IV SOLN
1.0000 g | INTRAVENOUS | Status: AC
  Administered 2020-01-23 – 2020-01-24 (×2): 1 g via INTRAVENOUS
  Filled 2020-01-22 (×2): qty 1

## 2020-01-22 MED ORDER — LABETALOL HCL 5 MG/ML IV SOLN
5.0000 mg | Freq: Once | INTRAVENOUS | Status: AC
  Administered 2020-01-22: 5 mg via INTRAVENOUS
  Filled 2020-01-22: qty 4

## 2020-01-22 MED ORDER — LACTATED RINGERS IV BOLUS
250.0000 mL | Freq: Once | INTRAVENOUS | Status: AC
  Administered 2020-01-22: 250 mL via INTRAVENOUS

## 2020-01-22 MED ORDER — IOHEXOL 9 MG/ML PO SOLN
500.0000 mL | ORAL | Status: AC
  Administered 2020-01-22 (×2): 500 mL via ORAL

## 2020-01-22 MED ORDER — POTASSIUM CHLORIDE 10 MEQ/100ML IV SOLN
10.0000 meq | INTRAVENOUS | Status: AC
  Administered 2020-01-22: 10 meq via INTRAVENOUS
  Filled 2020-01-22: qty 100

## 2020-01-22 MED ORDER — ONDANSETRON HCL 4 MG/2ML IJ SOLN
4.0000 mg | Freq: Once | INTRAMUSCULAR | Status: AC
  Administered 2020-01-22: 4 mg via INTRAVENOUS
  Filled 2020-01-22: qty 2

## 2020-01-22 MED ORDER — TIMOLOL MALEATE 0.5 % OP SOLN
1.0000 [drp] | Freq: Every day | OPHTHALMIC | Status: DC
  Administered 2020-01-22 – 2020-01-26 (×5): 1 [drp] via OPHTHALMIC
  Filled 2020-01-22 (×2): qty 5

## 2020-01-22 MED ORDER — ACETAMINOPHEN 325 MG PO TABS: 650.0000 mg | ORAL_TABLET | Freq: Four times a day (QID) | ORAL | Status: DC | PRN

## 2020-01-22 MED ORDER — METOPROLOL TARTRATE 5 MG/5ML IV SOLN
2.5000 mg | INTRAVENOUS | Status: DC | PRN
  Filled 2020-01-22 (×3): qty 5

## 2020-01-22 MED ORDER — SODIUM CHLORIDE 0.9 % IV BOLUS
500.0000 mL | Freq: Once | INTRAVENOUS | Status: AC
  Administered 2020-01-22: 500 mL via INTRAVENOUS

## 2020-01-22 MED ORDER — LATANOPROST 0.005 % OP SOLN
2.0000 [drp] | Freq: Every day | OPHTHALMIC | Status: DC
  Administered 2020-01-22 – 2020-01-23 (×2): 2 [drp] via OPHTHALMIC
  Filled 2020-01-22: qty 2.5

## 2020-01-22 MED ORDER — ACETAMINOPHEN 650 MG RE SUPP: 650.0000 mg | Freq: Four times a day (QID) | RECTAL | Status: DC | PRN

## 2020-01-22 MED ORDER — BRIMONIDINE TARTRATE 0.2 % OP SOLN
2.0000 [drp] | Freq: Every day | OPHTHALMIC | Status: DC
  Administered 2020-01-22 – 2020-01-26 (×5): 2 [drp] via OPHTHALMIC
  Filled 2020-01-22: qty 5

## 2020-01-22 MED ORDER — METOCLOPRAMIDE HCL 5 MG/ML IJ SOLN
10.0000 mg | Freq: Once | INTRAMUSCULAR | Status: AC
  Administered 2020-01-22: 10 mg via INTRAVENOUS
  Filled 2020-01-22: qty 2

## 2020-01-22 MED ORDER — DORZOLAMIDE HCL 2 % OP SOLN
2.0000 [drp] | Freq: Every day | OPHTHALMIC | Status: DC
  Administered 2020-01-22 – 2020-01-26 (×5): 2 [drp] via OPHTHALMIC
  Filled 2020-01-22: qty 10

## 2020-01-22 MED ORDER — FAMOTIDINE IN NACL 20-0.9 MG/50ML-% IV SOLN: 20.0000 mg | Freq: Two times a day (BID) | INTRAVENOUS | Status: DC

## 2020-01-22 MED ORDER — SODIUM CHLORIDE 0.9 % IV SOLN: INTRAVENOUS | Status: DC

## 2020-01-22 MED ORDER — ONDANSETRON HCL 4 MG/2ML IJ SOLN: 4.0000 mg | Freq: Three times a day (TID) | INTRAMUSCULAR | Status: DC | PRN

## 2020-01-22 MED ORDER — HYDRALAZINE HCL 20 MG/ML IJ SOLN
5.0000 mg | INTRAMUSCULAR | Status: DC | PRN
  Filled 2020-01-22: qty 0.25

## 2020-01-22 MED ORDER — SODIUM CHLORIDE 0.9 % IV SOLN: 1.0000 g | INTRAVENOUS | Status: DC

## 2020-01-22 MED ORDER — SODIUM CHLORIDE 0.9 % IV SOLN
1.0000 g | Freq: Once | INTRAVENOUS | Status: AC
  Administered 2020-01-22: 1 g via INTRAVENOUS
  Filled 2020-01-22: qty 10

## 2020-01-22 NOTE — ED Notes (Signed)
Colostomy bag emptied.

## 2020-01-22 NOTE — ED Triage Notes (Signed)
Pt presents from home via acems with c/o emesis since last night. Pt has colostomy bag. Hx of diverticulitis and hypertension. Pt did not take meds this am. Pt given 4mg  zofran in route by ems. Pt reports improvement with interventions.

## 2020-01-22 NOTE — ED Provider Notes (Signed)
[facility] Emergency Department Provider Note    First MD Initiated Contact with Patient 01/22/20 1034     (approximate)  I have reviewed the triage vital signs and the nursing notes.   HISTORY  Chief Complaint Emesis    HPI Jesse Macias is a 86 y.o. male [place] past medical history presents to the ER for evaluation of nausea vomiting and not keeping any food down for the past 24 hours and did notice some decreased output from his colostomy bag overnight.   Also having some crampy abdominal pain.  Has a history of diabetes but denies any history of gastroparesis.  Has not been able to keep any of his home meds down.   Past Medical History:  Diagnosis Date  . Anemia    anemia of chronic renal disease  . Chronic kidney disease    acute renal failure, chronic kidney disease stage III  . Diabetes mellitus without complication ([place])   . Diverticulitis   . Glaucoma   . History of hiatal hernia   . Hyperlipemia   . Hypertension   . Neuromuscular disorder ([place])    peripheral neuropathy   Family History  Problem Relation Age of Onset  . Diabetes Mother   . Diverticulitis Mother   . Kidney disease Neg Hx   . Prostate cancer Neg Hx   . Kidney cancer Neg Hx   . Bladder Cancer Neg Hx    Past Surgical History:  Procedure Laterality Date  . BACK SURGERY    . CENTRAL VENOUS CATHETER INSERTION N/A 06/09/2015   Procedure: INSERTION CENTRAL LINE ADULT;  Surgeon: Florene Glen, MD;  Location: [facility] ORS;  Service: General;  Laterality: N/A;  . COLECTOMY WITH COLOSTOMY CREATION/HARTMANN PROCEDURE N/A 06/09/2015   Procedure: COLECTOMY WITH COLOSTOMY CREATION/HARTMANN PROCEDURE;  Surgeon: Florene Glen, MD;  Location: [facility] ORS;  Service: General;  Laterality: N/A;  . HERNIA REPAIR     Two   Patient Active Problem List   Diagnosis Date Noted  . Nausea & vomiting 01/22/2020  . HTN (hypertension) 01/22/2020  . Hypokalemia 01/22/2020  . CKD (chronic  kidney disease), stage IV ([place]) 01/22/2020  . BPH with obstruction/lower urinary tract symptoms 03/16/2016  . Aorto-iliac disease ([place]) 08/03/2015  . MGUS (monoclonal gammopathy of unknown significance) 07/31/2015  . Diverticulitis of colon without hemorrhage 07/11/2015  . Ileus ([place])   . Perforated diverticulum of large intestine   . Acute diverticulitis 06/02/2015  . Atrial fibrillation, chronic ([place]) 12/06/2014  . Chronic atrial fibrillation ([place]) 12/06/2014  . Benign hypertension 05/19/2014  . HLD (hyperlipidemia) 05/19/2014  . Neuropathy 05/19/2014  . Hypotension, postural 05/19/2014      Prior to Admission medications   Medication Sig Start Date End Date Taking? Authorizing Provider  apixaban (ELIQUIS) 2.5 MG TABS tablet Take 1 tablet (2.5 mg total) by mouth 2 (two) times daily. 07/25/15   Nance Pear, MD  brimonidine (ALPHAGAN) 0.2 % ophthalmic solution Place 2 drops into both eyes daily. 06/01/15   [provider]  cyanocobalamin (V-R VITAMIN B-12) 500 MCG tablet Take by mouth.    [provider]  dorzolamide (TRUSOPT) 2 % ophthalmic solution Place 2 drops into both eyes daily. 04/21/15   [provider]  finasteride (PROSCAR) 5 MG tablet TAKE 1 TABLET DAILY 02/24/17   McGowan, Larene Beach A, PA-C  latanoprost (XALATAN) 0.005 % ophthalmic solution Place 2 drops into both eyes daily. 03/10/15   [provider]  losartan-hydrochlorothiazide (HYZAAR) 100-12.5 MG tablet  Take 1 tablet by mouth daily. 07/25/15   Nance Pear, MD  metoprolol (TOPROL-XL) 200 MG 24 hr tablet Take by mouth. 01/14/17   [provider]  tamsulosin (FLOMAX) 0.4 MG CAPS capsule Take 1 capsule (0.4 mg total) by mouth daily. 02/22/18   Zara Council A, PA-C  timolol (TIMOPTIC) 0.5 % ophthalmic solution  03/11/16   [provider]    Allergies Finasteride    Social History Social History   Tobacco Use  . Smoking status: Never Smoker  . Smokeless  tobacco: Never Used  Substance Use Topics  . Alcohol use: No    Comment: quit in 1961  . Drug use: No    Review of Systems Patient denies headaches, rhinorrhea, blurry vision, numbness, shortness of breath, chest pain, edema, cough, abdominal pain, nausea, vomiting, diarrhea, dysuria, fevers, rashes or hallucinations unless otherwise stated above in HPI. ____________________________________________   PHYSICAL EXAM:  VITAL SIGNS: Vitals:   01/22/20 1200 01/22/20 1215  BP: (!) 170/98   Pulse: 98 97  Resp: 19 13  Temp:    SpO2: 97% 98%    Constitutional: Alert and oriented.  Eyes: Conjunctivae are normal.  Head: Atraumatic. Nose: No congestion/rhinnorhea. Mouth/Throat: Mucous membranes are moist.   Neck: No stridor. Painless ROM.  Cardiovascular: Normal rate, regular rhythm. Grossly normal heart sounds.  Good peripheral circulation. Respiratory: Normal respiratory effort.  No retractions. Lungs CTAB. Gastrointestinal: Soft with mild ttp,  No stool in colostomy bagNo distention. No abdominal bruits. No CVA tenderness. Genitourinary:  Musculoskeletal: No lower extremity tenderness nor edema.  No joint effusions. Neurologic:  Normal speech and language. No gross focal neurologic deficits are appreciated. No facial droop Skin:  Skin is warm, dry and intact. No rash noted. Psychiatric: Mood and affect are normal. Speech and behavior are normal.  ____________________________________________   LABS (all labs ordered are listed, but only abnormal results are displayed)  Results for orders placed or performed during the hospital encounter of 01/22/20 (from the past 24 hour(s))  Lipase, blood     Status: None   Collection Time: 01/22/20 10:34 AM  Result Value Ref Range   Lipase 21 11 - 51 U/L  Comprehensive metabolic panel     Status: Abnormal   Collection Time: 01/22/20 10:34 AM  Result Value Ref Range   Sodium 141 135 - 145 mmol/L   Potassium 3.4 (L) 3.5 - 5.1 mmol/L    Chloride 107 98 - 111 mmol/L   CO2 24 22 - 32 mmol/L   Glucose, Bld 167 (H) 70 - 99 mg/dL   BUN 37 (H) 8 - 23 mg/dL   Creatinine, Ser 2.14 (H) 0.61 - 1.24 mg/dL   Calcium 10.3 8.9 - 10.3 mg/dL   Total Protein 6.9 6.5 - 8.1 g/dL   Albumin 3.9 3.5 - 5.0 g/dL   AST 15 15 - 41 U/L   ALT 10 0 - 44 U/L   Alkaline Phosphatase 57 38 - 126 U/L   Total Bilirubin 1.5 (H) 0.3 - 1.2 mg/dL   GFR calc non Af Amer 26 (L) >60 mL/min   GFR calc Af Amer 30 (L) >60 mL/min   Anion gap 10 5 - 15  CBC     Status: None   Collection Time: 01/22/20 10:34 AM  Result Value Ref Range   WBC 9.7 4.0 - 10.5 K/uL   RBC 4.60 4.22 - 5.81 MIL/uL   Hemoglobin 14.7 13.0 - 17.0 g/dL   HCT 43.6 39.0 - 52.0 %  MCV 94.8 80.0 - 100.0 fL   MCH 32.0 26.0 - 34.0 pg   MCHC 33.7 30.0 - 36.0 g/dL   RDW 13.7 11.5 - 15.5 %   Platelets 187 150 - 400 K/uL   nRBC 0.0 0.0 - 0.2 %   ____________________________________________  EKG My review and personal interpretation at Time: 10:34   Indication: n/v  Rate: 95  Rhythm: sinus Axis: normal Other: nonspecific st abn ____________________________________________  RADIOLOGY  I personally reviewed all radiographic images ordered to evaluate for the above acute complaints and reviewed radiology reports and findings.  These findings were personally discussed with the patient.  Please see medical record for radiology report.  ____________________________________________   PROCEDURES  Procedure(s) performed:  Procedures    Critical Care performed: no ____________________________________________   INITIAL IMPRESSION / ASSESSMENT AND PLAN / ED COURSE  Pertinent labs & imaging results that were available during my care of the patient were reviewed by me and considered in my medical decision making (see chart for details).   DDX: SBO, ileus, gastroparesis, electrolyte abnormality, diverticulitis,   Jesse Macias is a 86 y.o. who presents to the ED with symptoms as described  above.  Patient chronically ill-appearing with complex past medical history.  Reporting several episodes of nausea vomiting decreased colostomy output concerning for obstructive process.  Will provide symptomatic management.  Will order CT imaging.  The patient will be placed on continuous pulse oximetry and telemetry for monitoring.  Laboratory evaluation will be sent to evaluate for the above complaints.     Clinical Course as of Jan 21 1506  Sun Jan 22, 2020  1314 CT with oral contrast was ordered but patient having persistent vomiting.  Will proceed to CT   [PR]  1451 CT imaging shows evidence of partial obstruction versus ileus.  He is still having nausea and vomiting will give additional antiemetic but his ostomy bag is now filling for have lower suspicion for SBO.  Possible gastroparesis does have a history of diabetes and polyneuropathy.  Will give Reglan.  He is unable to tolerate his home medicines given his comorbidities will discuss with hospitalist for observation and symptomatic management with IV fluids.   [PR]    Clinical Course User Index [PR] Merlyn Lot, MD    The patient was evaluated in Emergency Department today for the symptoms described in the history of present illness. He/she was evaluated in the context of the global COVID-19 pandemic, which necessitated consideration that the patient might be at risk for infection with the SARS-CoV-2 virus that causes COVID-19. Institutional protocols and algorithms that pertain to the evaluation of patients at risk for COVID-19 are in a state of rapid change based on information released by regulatory bodies including the CDC and federal and state organizations. These policies and algorithms were followed during the patient's care in the ED.  As part of my medical decision making, I reviewed the following data within the [place] notes reviewed and incorporated, Labs reviewed, notes from prior ED visits  and [place] Controlled Substance Database   ____________________________________________   FINAL CLINICAL IMPRESSION(S) / ED DIAGNOSES  Final diagnoses:  Intractable vomiting with nausea, unspecified vomiting type  Dehydration      NEW MEDICATIONS STARTED DURING THIS VISIT:  New Prescriptions   No medications on file     Note:  This document was prepared using Dragon voice recognition software and may include unintentional dictation errors.    Merlyn Lot, MD 01/22/20 [phone]

## 2020-01-22 NOTE — H&P (Signed)
History and Physical    Jesse Macias HEN:[id number] DOB: 28-Jan-1924 DOA: 01/22/2020  Referring MD/NP/PA:   PCP: Kirk Ruths, MD   Patient coming from:  The patient is coming from home.  At baseline, pt is dependent for most of ADL.        Chief Complaint: intractable nausea, vomiting  HPI: Jesse Macias is a 86 y.o. male with medical history significant of perforated diverticulum, s/p of colostomy, hypertension, hyperlipidemia, diabetes mellitus, CKD-4, MGUS, atrial fibrillation on Eliquis, BPH, blindness, who presents with intractable nausea, vomiting.  Pt states that he started having nausea vomiting since last night, which has been progressively worsening.  He has some multiple times of nonbilious nonbloody vomiting.  He had a mild abdominal pain which has resolved.  Denies chest pain, shortness breath, cough, fever or chills.  Denies symptoms of UTI or unilateral weakness.  ED Course: pt was found to have WBC 9.7, lipase 21, positive urinalysis for UTI (cloudy appearance, large amount of leukocyte, many bacteria, WBC 50), potassium 3.4, slightly worsening renal function, temperature normal, blood pressure 170/98, heart rate 97, oxygen saturation 97% on room air.  CT abdomen/pelvis that showed possible bowel obstruction versus ileus.  Patient is admitted to [facility] bed as inpatient.  General surgeon, Dr. Dahlia Byes is consulted.  # CT-abdomen/pelvis: 1. Signs of left lower quadrant colostomy for partial colonic resection with gradual transition of dilated bowel loops with relatively decompressed ileal bowel loops on today's study. Findings may represent ileus, favored over bowel obstruction. Partial small bowel obstruction could have a similar appearance. 2. Distal concentric but slightly asymmetric esophageal thickening is noted at the GE junction. Findings are nonspecific perhaps related to esophagitis and not well evaluated on the current study. Direct visualization on follow-up  may be warranted. 3. Gastric distension full of ingested contrast and material. Correlate with any clinical evidence of gastro paresis 4. Stable mild left UPJ dilation. 5. Urinary bladder is less distended but remains trabeculated with small bladder diverticuli. 6. Aortic atherosclerosis.   Review of Systems:   General: no fevers, chills, no body weight gain, has poor appetite, has fatigue HEENT: has blindness and hearing loss, no sore throat Respiratory: no dyspnea, coughing, wheezing CV: no chest pain, no palpitations GI: has nausea, vomiting, abdominal pain, no diarrhea, constipation GU: no dysuria, burning on urination, increased urinary frequency, hematuria  Ext: no leg edema Neuro: no unilateral weakness, numbness, or tingling, no vision change or hearing loss Skin: no rash, no skin tear. MSK: No muscle spasm, no deformity, no limitation of range of movement in spin Heme: No easy bruising.  Travel history: No recent long distant travel.  Allergy:  Allergies  Allergen Reactions  . Finasteride Other (See Comments)    gynecomastia    Past Medical History:  Diagnosis Date  . Anemia    anemia of chronic renal disease  . Chronic kidney disease    acute renal failure, chronic kidney disease stage III  . Diabetes mellitus without complication ([place])   . Diverticulitis   . Glaucoma   . History of hiatal hernia   . Hyperlipemia   . Hypertension   . Neuromuscular disorder ([place])    peripheral neuropathy    Past Surgical History:  Procedure Laterality Date  . BACK SURGERY    . CENTRAL VENOUS CATHETER INSERTION N/A 06/09/2015   Procedure: INSERTION CENTRAL LINE ADULT;  Surgeon: Florene Glen, MD;  Location: [facility] ORS;  Service: General;  Laterality: N/A;  . COLECTOMY WITH COLOSTOMY CREATION/HARTMANN  PROCEDURE N/A 06/09/2015   Procedure: COLECTOMY WITH COLOSTOMY CREATION/HARTMANN PROCEDURE;  Surgeon: Florene Glen, MD;  Location: [facility] ORS;  Service: General;  Laterality:  N/A;  . HERNIA REPAIR     Two    Social History:  reports that he has never smoked. He has never used smokeless tobacco. He reports that he does not drink alcohol or use drugs.  Family History:  Family History  Problem Relation Age of Onset  . Diabetes Mother   . Diverticulitis Mother   . Kidney disease Neg Hx   . Prostate cancer Neg Hx   . Kidney cancer Neg Hx   . Bladder Cancer Neg Hx      Prior to Admission medications   Medication Sig Start Date End Date Taking? Authorizing Provider  apixaban (ELIQUIS) 2.5 MG TABS tablet Take 1 tablet (2.5 mg total) by mouth 2 (two) times daily. 07/25/15   Nance Pear, MD  brimonidine (ALPHAGAN) 0.2 % ophthalmic solution Place 2 drops into both eyes daily. 06/01/15   [provider]  cyanocobalamin (V-R VITAMIN B-12) 500 MCG tablet Take by mouth.    [provider]  dorzolamide (TRUSOPT) 2 % ophthalmic solution Place 2 drops into both eyes daily. 04/21/15   [provider]  finasteride (PROSCAR) 5 MG tablet TAKE 1 TABLET DAILY 02/24/17   McGowan, Larene Beach A, PA-C  latanoprost (XALATAN) 0.005 % ophthalmic solution Place 2 drops into both eyes daily. 03/10/15   [provider]  losartan-hydrochlorothiazide (HYZAAR) 100-12.5 MG tablet Take 1 tablet by mouth daily. 07/25/15   Nance Pear, MD  metoprolol (TOPROL-XL) 200 MG 24 hr tablet Take by mouth. 01/14/17   [provider]  tamsulosin (FLOMAX) 0.4 MG CAPS capsule Take 1 capsule (0.4 mg total) by mouth daily. 02/22/18   Zara Council A, PA-C  timolol (TIMOPTIC) 0.5 % ophthalmic solution  03/11/16   [provider]    Physical Exam: Vitals:   01/22/20 1445 01/22/20 1515 01/22/20 1530 01/22/20 1545  BP:  (!) 144/87 (!) 147/92 (!) 147/97  Pulse: 93 91 91 92  Resp: (!) 25 (!) 24 (!) 23 (!) 21  Temp:      SpO2: 95% 97% 95% 98%  Weight:      Height:       General: Not in acute distress HEENT:       Eyes: no scleral icterus.       ENT:  No discharge from the ears and nose, no pharynx injection, no tonsillar enlargement.        Neck: No JVD, no bruit, no mass felt. Heme: No neck lymph node enlargement. Cardiac: S1/S2, RRR, No murmurs, No gallops or rubs. Respiratory:  No rales, wheezing, rhonchi or rubs. GI: Soft, nondistended, nontender, no rebound pain, no organomegaly, BS present. S/p of colostomy, there is some dark-colored fluid in bag. GU: No hematuria Ext: No pitting leg edema bilaterally. 2+DP/PT pulse bilaterally. Musculoskeletal: No joint deformities, No joint redness or warmth, no limitation of ROM in spin. Skin: No rashes.  Neuro: Alert, oriented X3, moves all extremities normally. Psych: Patient is not psychotic, no suicidal or hemocidal ideation.  Labs on Admission: I have personally reviewed following labs and imaging studies  CBC: Recent Labs  Lab 01/22/20 1034  WBC 9.7  HGB 14.7  HCT 43.6  MCV 94.8  PLT 202   Basic Metabolic Panel: Recent Labs  Lab 01/22/20 1034  NA 141  K 3.4*  CL 107  CO2 24  GLUCOSE 167*  BUN 37*  CREATININE 2.14*  CALCIUM 10.3   GFR: Estimated Creatinine Clearance: 18.4 mL/min (A) (by C-G formula based on SCr of 2.14 mg/dL (H)). Liver Function Tests: Recent Labs  Lab 01/22/20 1034  AST 15  ALT 10  ALKPHOS 57  BILITOT 1.5*  PROT 6.9  ALBUMIN 3.9   Recent Labs  Lab 01/22/20 1034  LIPASE 21   No results for input(s): AMMONIA in the last 168 hours. Coagulation Profile: No results for input(s): INR, PROTIME in the last 168 hours. Cardiac Enzymes: No results for input(s): CKTOTAL, CKMB, CKMBINDEX, TROPONINI in the last 168 hours. BNP (last 3 results) No results for input(s): PROBNP in the last 8760 hours. HbA1C: No results for input(s): HGBA1C in the last 72 hours. CBG: No results for input(s): GLUCAP in the last 168 hours. Lipid Profile: No results for input(s): CHOL, HDL, LDLCALC, TRIG, CHOLHDL, LDLDIRECT in the last 72 hours. Thyroid Function  Tests: No results for input(s): TSH, T4TOTAL, FREET4, T3FREE, THYROIDAB in the last 72 hours. Anemia Panel: No results for input(s): VITAMINB12, FOLATE, FERRITIN, TIBC, IRON, RETICCTPCT in the last 72 hours. Urine analysis:    Component Value Date/Time   COLORURINE YELLOW (A) 01/22/2020 1454   APPEARANCEUR CLOUDY (A) 01/22/2020 1454   APPEARANCEUR Clear 09/10/2015 1401   LABSPEC 1.014 01/22/2020 1454   PHURINE 5.0 01/22/2020 1454   GLUCOSEU NEGATIVE 01/22/2020 1454   HGBUR MODERATE (A) 01/22/2020 1454   BILIRUBINUR NEGATIVE 01/22/2020 1454   BILIRUBINUR Negative 09/10/2015 1401   KETONESUR NEGATIVE 01/22/2020 1454   PROTEINUR 30 (A) 01/22/2020 1454   NITRITE NEGATIVE 01/22/2020 1454   LEUKOCYTESUR LARGE (A) 01/22/2020 1454   Sepsis Labs: @LABRCNTIP (procalcitonin:4,lacticidven:4) )No results found for this or any previous visit (from the past 240 hour(s)).   Radiological Exams on Admission: CT ABDOMEN PELVIS WO CONTRAST  Result Date: 01/22/2020 CLINICAL DATA:  Nausea vomiting, concern for bowel obstruction EXAM: CT ABDOMEN AND PELVIS WITHOUT CONTRAST TECHNIQUE: Multidetector CT imaging of the abdomen and pelvis was performed following the standard protocol without IV contrast. COMPARISON:  06/24/2015 FINDINGS: Lower chest: No consolidation or evidence of pleural effusion. No pericardial fluid. Heart is incompletely imaged. Hepatobiliary: Stable caudate cyst. No suspicious lesion on noncontrast imaging. No pericholecystic stranding. No overt biliary ductal dilation. Pancreas: Pancreas with fatty replacement, no sign of peripancreatic inflammation. Spleen: Spleen is normal size without focal lesion. Adrenals/Urinary Tract: Adrenal glands are normal. Renal cortical scarring. Mild left UPJ dilation of a left extrarenal pelvis similar to the prior study. Cortical loss bilaterally associated with the kidneys is similar to the remote prior examination. The urinary bladder is less distended but  remains trabeculated with small bladder diverticuli. Stomach/Bowel: Gastric distension, filled with enteric contrast. Concentric slightly asymmetric thickening of the distal esophagus/GE junction. Signs of partial colectomy with rectal pouch and left lower quadrant colostomy. No evidence o compete f complete acute bowel obstruction, perienteric stranding or pneumatosis. There is gradual caliber change in the ileum with mild upstream dilation of small bowel loops. The appendix is normal. Colonic diverticulosis. Peristomal herniation of a knuckle of small bowel in the left lower quadrant about the ostomy without signs of obstruction at this level. Vascular/Lymphatic: Calcified plaque throughout the abdominal aorta, no sign of aneurysm. Not well assessed given lack of intravenous contrast. Stable dilation of the celiac axis. No adenopathy in the upper abdomen or in the retroperitoneum. No pelvic lymphadenopathy. Reproductive: Prostate remains enlarged, nonspecific on CT. Other: Evidence of prior right inguinal herniorrhaphy. Peristomal herniation of  small bowel loops about the left lower quadrant colostomy. Musculoskeletal: Degenerative changes in the spine and in the hips. No acute bone finding. IMPRESSION: 1. Signs of left lower quadrant colostomy for partial colonic resection with gradual transition of dilated bowel loops with relatively decompressed ileal bowel loops on today's study. Findings may represent ileus, favored over bowel obstruction. Partial small bowel obstruction could have a similar appearance. 2. Distal concentric but slightly asymmetric esophageal thickening is noted at the GE junction. Findings are nonspecific perhaps related to esophagitis and not well evaluated on the current study. Direct visualization on follow-up may be warranted. 3. Gastric distension full of ingested contrast and material. Correlate with any clinical evidence of gastro paresis 4. Stable mild left UPJ dilation. 5. Urinary  bladder is less distended but remains trabeculated with small bladder diverticuli. 6. Aortic atherosclerosis. Aortic Atherosclerosis (ICD10-I70.0). Electronically Signed   By: Zetta Bills M.D.   On: 01/22/2020 14:09     EKG: Independently reviewed.  Sinus rhythm, low voltage, QTC 381, nonspecific changes  Assessment/Plan Principal Problem:   Intractable nausea and vomiting Active Problems:   Chronic atrial fibrillation ([name])   BPH with obstruction/lower urinary tract symptoms   HTN (hypertension)   Hypokalemia   CKD (chronic kidney disease), stage IV ([name])   UTI (urinary tract infection)   Intractable nausea and vomiting: CT scan showed possible bowel obstruction versus ileus.  General surgeon, Dr. Dahlia Byes is consulted.  -will admitted to [facility] as inpatient -NG tube ordered by surgeon -As needed Zofran and morphine for pain -N.p.o. -Hold all oral medications -IV fluid: 500 cc normal saline and 250 cc of LR is given in ED, then 75 cc/h of NS  Chronic atrial fibrillation ([name]) -hold Eliquis -hold oral coreg -prn IV metoprolol for heart rate >120  BPH with obstruction/lower urinary tract symptoms -hold Flomax   HTN (hypertension) --Hold Hyzaar and Coreg -As needed hydralazine IV  Hypokalemia: K= 3.4 on admission. - Repleted - Check Mg level  CKD (chronic kidney disease), stage IV ([place]): Slightly worsening.  Baseline creatinine 1.9 on 06/08/2019.  His creatinine is at 2.14, BUN 37 -f/u by BMP -On IV fluid as above -Hyzaar is on hold  Possible UTI (urinary tract infection) -Rocephin -Follow-up urine culture      Inpatient status:  # Patient requires inpatient status due to high intensity of service, high risk for further deterioration and high frequency of surveillance required.  I certify that at the point of admission it is my clinical judgment that the patient will require inpatient hospital care spanning beyond 2 midnights from the point of  admission.  . This patient has multiple chronic comorbidities including perforated diverticulum, s/p of colostomy, hypertension, hyperlipidemia, diabetes mellitus, CKD-4, MGUS, atrial fibrillation on Eliquis, BPH, blindness .  Marland Kitchen Now patient has presenting with intractable nausea vomiting due to possible bowel obstruction versus ileus, also has UTI . The initial radiographic and laboratory data are worrisome because of positive urinalysis for UTI, hypokalemia.  CT abdomen/pelvis showed possible bowel obstruction versus ileus. . Current medical needs: please see my assessment and plan . Predictability of an adverse outcome (risk): Patient has multiple comorbidities as listed above. Now presents with  intractable nausea, vomiting due to possible bowel obstruction versus ileus, also has UTI. Patient's presentation is highly complicated. Given his old age, patient is at high risk of deteriorating.  Will need to be treated in hospital for at least 2 days.    DVT ppx: SCD Code Status: Full code  Family Communication:   Yes, patient's wife  at bed side Disposition Plan:  Anticipate discharge back to previous home environment Consults called:  Dr. Dahlia Byes of general surgeon Admission status: Med-surg bed as inpt       Date of Service 01/22/2020    [facility]   If 7PM-7AM, please contact night-coverage [email or web address] 01/22/2020, 4:30 PM

## 2020-01-22 NOTE — ED Notes (Signed)
Floor unable to take report at this time. To call back to receive report per secretary.

## 2020-01-23 ENCOUNTER — Inpatient Hospital Stay: Payer: Medicare Other

## 2020-01-23 LAB — CBC
HCT: 37.8 % — ABNORMAL LOW (ref 39.0–52.0)
Hemoglobin: 13.2 g/dL (ref 13.0–17.0)
MCH: 32.7 pg (ref 26.0–34.0)
MCHC: 34.9 g/dL (ref 30.0–36.0)
MCV: 93.6 fL (ref 80.0–100.0)
Platelets: 171 10*3/uL (ref 150–400)
RBC: 4.04 MIL/uL — ABNORMAL LOW (ref 4.22–5.81)
RDW: 13.8 % (ref 11.5–15.5)
WBC: 7.8 10*3/uL (ref 4.0–10.5)
nRBC: 0 % (ref 0.0–0.2)

## 2020-01-23 LAB — BASIC METABOLIC PANEL
Anion gap: 9 (ref 5–15)
BUN: 35 mg/dL — ABNORMAL HIGH (ref 8–23)
CO2: 24 mmol/L (ref 22–32)
Calcium: 9.3 mg/dL (ref 8.9–10.3)
Chloride: 109 mmol/L (ref 98–111)
Creatinine, Ser: 2.02 mg/dL — ABNORMAL HIGH (ref 0.61–1.24)
GFR calc Af Amer: 32 mL/min — ABNORMAL LOW (ref >60)
GFR calc non Af Amer: 27 mL/min — ABNORMAL LOW (ref >60)
Glucose, Bld: 108 mg/dL — ABNORMAL HIGH (ref 70–99)
Potassium: 3.4 mmol/L — ABNORMAL LOW (ref 3.5–5.1)
Sodium: 142 mmol/L (ref 135–145)

## 2020-01-23 LAB — GLUCOSE, CAPILLARY: Glucose-Capillary: 83 mg/dL (ref 70–99)

## 2020-01-23 LAB — MAGNESIUM: Magnesium: 1.6 mg/dL — ABNORMAL LOW (ref 1.7–2.4)

## 2020-01-23 MED ORDER — POTASSIUM CHLORIDE 10 MEQ/100ML IV SOLN
10.0000 meq | INTRAVENOUS | Status: AC
  Administered 2020-01-23 (×4): 10 meq via INTRAVENOUS
  Filled 2020-01-23 (×4): qty 100

## 2020-01-23 MED ORDER — METOPROLOL TARTRATE 5 MG/5ML IV SOLN
10.0000 mg | INTRAVENOUS | Status: AC
  Administered 2020-01-23: 10 mg via INTRAVENOUS
  Filled 2020-01-23: qty 10

## 2020-01-23 MED ORDER — METOPROLOL TARTRATE 5 MG/5ML IV SOLN
5.0000 mg | INTRAVENOUS | Status: AC
  Administered 2020-01-23: 5 mg via INTRAVENOUS

## 2020-01-23 MED ORDER — METOPROLOL TARTRATE 5 MG/5ML IV SOLN
5.0000 mg | Freq: Once | INTRAVENOUS | Status: AC
  Administered 2020-01-23: 5 mg via INTRAVENOUS

## 2020-01-23 MED ORDER — MAGNESIUM SULFATE 2 GM/50ML IV SOLN
2.0000 g | Freq: Once | INTRAVENOUS | Status: AC
  Administered 2020-01-23: 2 g via INTRAVENOUS
  Filled 2020-01-23: qty 50

## 2020-01-23 MED ORDER — FAMOTIDINE IN NACL 20-0.9 MG/50ML-% IV SOLN
20.0000 mg | INTRAVENOUS | Status: DC
  Administered 2020-01-23 – 2020-01-24 (×2): 20 mg via INTRAVENOUS
  Filled 2020-01-23 (×2): qty 50

## 2020-01-23 MED ORDER — CHLORHEXIDINE GLUCONATE CLOTH 2 % EX PADS
6.0000 | MEDICATED_PAD | Freq: Every day | CUTANEOUS | Status: DC
  Administered 2020-01-23 – 2020-01-25 (×3): 6 via TOPICAL

## 2020-01-23 MED ORDER — METOPROLOL TARTRATE 5 MG/5ML IV SOLN
10.0000 mg | Freq: Once | INTRAVENOUS | Status: AC
  Administered 2020-01-23: 10 mg via INTRAVENOUS

## 2020-01-23 MED ORDER — DILTIAZEM HCL 25 MG/5ML IV SOLN
15.0000 mg | Freq: Once | INTRAVENOUS | Status: AC
  Administered 2020-01-23: 15 mg via INTRAVENOUS
  Filled 2020-01-23: qty 5

## 2020-01-23 MED ORDER — SODIUM CHLORIDE 0.9 % IV BOLUS
500.0000 mL | Freq: Once | INTRAVENOUS | Status: AC
  Administered 2020-01-24: 500 mL via INTRAVENOUS

## 2020-01-23 MED ORDER — SODIUM CHLORIDE 0.9 % IV BOLUS
500.0000 mL | Freq: Once | INTRAVENOUS | Status: AC
  Administered 2020-01-23: 500 mL via INTRAVENOUS

## 2020-01-23 NOTE — Plan of Care (Signed)
  Problem: Education: Goal: Knowledge of General Education information will improve Description: Including pain rating scale, medication(s)/side effects and non-pharmacologic comfort measures Outcome: Progressing   Problem: Clinical Measurements: Goal: Cardiovascular complication will be avoided Outcome: Progressing   

## 2020-01-23 NOTE — Progress Notes (Signed)
PROGRESS NOTE    Jesse Macias  PNT:[id number] DOB: [birth year] DOA: 01/22/2020 PCP: Kirk Ruths, MD   Brief Narrative:  Jesse Macias is a 86 y.o. male with medical history significant of perforated diverticulum, s/p of colostomy, hypertension, hyperlipidemia, diabetes mellitus, CKD-4, MGUS, atrial fibrillation on Eliquis, BPH, blindness, who presents with intractable nausea, vomiting.  Pt states that he started having nausea vomiting since last night, which has been progressively worsening.  He has some multiple times of nonbilious nonbloody vomiting.  He had a mild abdominal pain which has resolved. CT abdomen with concern of ileus/bowel obstruction.  Surgery was consulted and NG tube was placed.  Subjective: She was feeling little better today.  NG tube with copious amount of nonbilious secretions.  No nausea or vomiting or abdominal pain.  Small amount of feces in colostomy bag.  Assessment & Plan:   Principal Problem:   Intractable nausea and vomiting Active Problems:   Chronic atrial fibrillation ([name])   BPH with obstruction/lower urinary tract symptoms   HTN (hypertension)   Hypokalemia   CKD (chronic kidney disease), stage IV ([name])   UTI (urinary tract infection)  Bowel obstruction/ileus.  Most likely the cause of his nausea and vomiting.  Symptoms improved with NG tube.  Surgery was consulted-has not seen him yet. -Continue NG tube. -Continue symptomatic management with Zofran and morphine for pain. -N.p.o. -Continue IVF at 75 mL/h  UTI.  UA concerning for infection.  Pending culture results. -Continue Rocephin for now.  Chronic atrial fibrillation ([name]) -hold Eliquis -hold oral coreg -prn IV metoprolol for heart rate >120  BPH with obstruction/lower urinary tract symptoms -hold Flomax   HTN (hypertension) --Hold Hyzaar and Coreg -As needed hydralazine IV  Hypokalemia: K= 3.4  with magnesium of 1.6. -Replete electrolyte and monitor.  CKD  (chronic kidney disease), stage IV ([place]): Slightly worsening.  Baseline creatinine 1.9 on 06/08/2019.  His creatinine is at 2.02 today. -Monitor renal functions. -Avoid nephrotoxins.   Objective: Vitals:   01/22/20 1545 01/22/20 2007 01/22/20 2223 01/23/20 0818  BP: (!) 147/97 (!) 149/91 (!) 145/86 (!) 184/94  Pulse: 92 95 (!) 103 78  Resp: (!) 21 16 16 16   Temp:  98.4 F (36.9 C) 98.4 F (36.9 C) 98.6 F (37 C)  TempSrc:  Oral Oral Oral  SpO2: 98% 96% 96% 97%  Weight:      Height:        Intake/Output Summary (Last 24 hours) at 01/23/2020 1524 Last data filed at 01/23/2020 1513 Gross per 24 hour  Intake 1917.32 ml  Output 1475 ml  Net 442.32 ml   Filed Weights   01/22/20 1038  Weight: 63.9 kg    Examination:  General exam: Frail elderly man, appears calm and comfortable.  NG tube in place Respiratory system: Clear to auscultation. Respiratory effort normal. Cardiovascular system: S1 & S2 heard, RRR. No JVD, murmurs, rubs, gallops or clicks. Gastrointestinal system: Soft, nontender, nondistended, bowel sounds active with colostomy bag containing small amount of feces. Central nervous system: Alert and oriented. No focal neurological deficits.Symmetric 5 x 5 power. Extremities: No edema, no cyanosis, pulses intact and symmetrical. Psychiatry: Judgement and insight appear normal.  DVT prophylaxis: SCDs.  We will restart Eliquis once able to take p.o. Code Status: Full Family Communication: Wife was updated at bedside. Disposition Plan:  Status is: Inpatient  Remains inpatient appropriate because:Inpatient level of care appropriate due to severity of illness   Dispo: The patient is from: Home  Anticipated d/c is to: Home              Anticipated d/c date is: 3 days              Patient currently is not medically stable to d/c.  With bowel obstruction and NG tube.  Going through conservative management.  Might need surgery if it fails.  Consultants:    Surgery  Procedures:  Antimicrobials:  Rocephin  Data Reviewed: I have personally reviewed following labs and imaging studies  CBC: Recent Labs  Lab 01/22/20 1034 01/23/20 0457  WBC 9.7 7.8  HGB 14.7 13.2  HCT 43.6 37.8*  MCV 94.8 93.6  PLT 187 979   Basic Metabolic Panel: Recent Labs  Lab 01/22/20 1034 01/23/20 0457  NA 141 142  K 3.4* 3.4*  CL 107 109  CO2 24 24  GLUCOSE 167* 108*  BUN 37* 35*  CREATININE 2.14* 2.02*  CALCIUM 10.3 9.3  MG  --  1.6*   GFR: Estimated Creatinine Clearance: 19.5 mL/min (A) (by C-G formula based on SCr of 2.02 mg/dL (H)). Liver Function Tests: Recent Labs  Lab 01/22/20 1034  AST 15  ALT 10  ALKPHOS 57  BILITOT 1.5*  PROT 6.9  ALBUMIN 3.9   Recent Labs  Lab 01/22/20 1034  LIPASE 21   No results for input(s): AMMONIA in the last 168 hours. Coagulation Profile: No results for input(s): INR, PROTIME in the last 168 hours. Cardiac Enzymes: No results for input(s): CKTOTAL, CKMB, CKMBINDEX, TROPONINI in the last 168 hours. BNP (last 3 results) No results for input(s): PROBNP in the last 8760 hours. HbA1C: No results for input(s): HGBA1C in the last 72 hours. CBG: Recent Labs  Lab 01/22/20 2146 01/23/20 0819  GLUCAP 112* 83   Lipid Profile: No results for input(s): CHOL, HDL, LDLCALC, TRIG, CHOLHDL, LDLDIRECT in the last 72 hours. Thyroid Function Tests: No results for input(s): TSH, T4TOTAL, FREET4, T3FREE, THYROIDAB in the last 72 hours. Anemia Panel: No results for input(s): VITAMINB12, FOLATE, FERRITIN, TIBC, IRON, RETICCTPCT in the last 72 hours. Sepsis Labs: No results for input(s): PROCALCITON, LATICACIDVEN in the last 168 hours.  Recent Results (from the past 240 hour(s))  SARS CORONAVIRUS 2 (TAT 6-24 HRS) Nasopharyngeal Nasopharyngeal Swab     Status: None   Collection Time: 01/22/20  2:54 PM   Specimen: Nasopharyngeal Swab  Result Value Ref Range Status   SARS Coronavirus 2 NEGATIVE NEGATIVE Final     Comment: (NOTE) SARS-CoV-2 target nucleic acids are NOT DETECTED. The SARS-CoV-2 RNA is generally detectable in upper and lower respiratory specimens during the acute phase of infection. Negative results do not preclude SARS-CoV-2 infection, do not rule out co-infections with other pathogens, and should not be used as the sole basis for treatment or other patient management decisions. Negative results must be combined with clinical observations, patient history, and epidemiological information. The expected result is Negative. Fact Sheet for Patients: SugarRoll.be Fact Sheet for Healthcare Providers: https://www.woods-mathews.com/ This test is not yet approved or cleared by the [place] FDA and  has been authorized for detection and/or diagnosis of SARS-CoV-2 by FDA under an Emergency Use Authorization (EUA). This EUA will remain  in effect (meaning this test can be used) for the duration of the COVID-19 declaration under Section 56 4(b)(1) of the Act, 21 U.S.C. section 360bbb-3(b)(1), unless the authorization is terminated or revoked sooner. Performed at [facility], [place] [address] [place], [place] [address]  Radiology Studies: CT ABDOMEN PELVIS WO CONTRAST  Result Date: 01/22/2020 CLINICAL DATA:  Nausea vomiting, concern for bowel obstruction EXAM: CT ABDOMEN AND PELVIS WITHOUT CONTRAST TECHNIQUE: Multidetector CT imaging of the abdomen and pelvis was performed following the standard protocol without IV contrast. COMPARISON:  06/24/2015 FINDINGS: Lower chest: No consolidation or evidence of pleural effusion. No pericardial fluid. Heart is incompletely imaged. Hepatobiliary: Stable caudate cyst. No suspicious lesion on noncontrast imaging. No pericholecystic stranding. No overt biliary ductal dilation. Pancreas: Pancreas with fatty replacement, no sign of peripancreatic inflammation. Spleen: Spleen is normal size without focal  lesion. Adrenals/Urinary Tract: Adrenal glands are normal. Renal cortical scarring. Mild left UPJ dilation of a left extrarenal pelvis similar to the prior study. Cortical loss bilaterally associated with the kidneys is similar to the remote prior examination. The urinary bladder is less distended but remains trabeculated with small bladder diverticuli. Stomach/Bowel: Gastric distension, filled with enteric contrast. Concentric slightly asymmetric thickening of the distal esophagus/GE junction. Signs of partial colectomy with rectal pouch and left lower quadrant colostomy. No evidence o compete f complete acute bowel obstruction, perienteric stranding or pneumatosis. There is gradual caliber change in the ileum with mild upstream dilation of small bowel loops. The appendix is normal. Colonic diverticulosis. Peristomal herniation of a knuckle of small bowel in the left lower quadrant about the ostomy without signs of obstruction at this level. Vascular/Lymphatic: Calcified plaque throughout the abdominal aorta, no sign of aneurysm. Not well assessed given lack of intravenous contrast. Stable dilation of the celiac axis. No adenopathy in the upper abdomen or in the retroperitoneum. No pelvic lymphadenopathy. Reproductive: Prostate remains enlarged, nonspecific on CT. Other: Evidence of prior right inguinal herniorrhaphy. Peristomal herniation of small bowel loops about the left lower quadrant colostomy. Musculoskeletal: Degenerative changes in the spine and in the hips. No acute bone finding. IMPRESSION: 1. Signs of left lower quadrant colostomy for partial colonic resection with gradual transition of dilated bowel loops with relatively decompressed ileal bowel loops on today's study. Findings may represent ileus, favored over bowel obstruction. Partial small bowel obstruction could have a similar appearance. 2. Distal concentric but slightly asymmetric esophageal thickening is noted at the GE junction. Findings are  nonspecific perhaps related to esophagitis and not well evaluated on the current study. Direct visualization on follow-up may be warranted. 3. Gastric distension full of ingested contrast and material. Correlate with any clinical evidence of gastro paresis 4. Stable mild left UPJ dilation. 5. Urinary bladder is less distended but remains trabeculated with small bladder diverticuli. 6. Aortic atherosclerosis. Aortic Atherosclerosis (ICD10-I70.0). Electronically Signed   By: Zetta Bills M.D.   On: 01/22/2020 14:09   DG Abdomen 1 View  Result Date: 01/22/2020 CLINICAL DATA:  Nasogastric tube placement. EXAM: ABDOMEN - 1 VIEW COMPARISON:  Radiographs 06/06/2015. CT earlier today. FINDINGS: 1700 hours. Enteric tube projects to the level of the mid stomach. The visualized bowel gas pattern is normal. Stable appearance of the visualized lower chest. IMPRESSION: Enteric tube tip in the mid stomach. Electronically Signed   By: Richardean Sale M.D.   On: 01/22/2020 17:28   DG Abd Portable 2V  Result Date: 01/23/2020 CLINICAL DATA:  Small-bowel obstruction. EXAM: PORTABLE ABDOMEN - 2 VIEW COMPARISON:  01/22/2020; CT abdomen pelvis-01/22/2020 FINDINGS: Enteric tube tip projects over the gastric fundus however side port projects over the expected location of the gastroesophageal junction. Small amount of enteric contrast is again seen throughout the colon to the level of the left lower abdominal end colostomy,  unchanged compared to the 01/22/2020 examination. No definitive distension of the upstream small bowel. Vascular calcifications overlie the left upper abdominal quadrant. Multiple phleboliths overlie the lower pelvis bilaterally. No pneumoperitoneum, pneumatosis or portal venous gas. Limited visualization of lower thorax demonstrates minimal left basilar heterogeneous opacities, likely atelectasis. Mild moderate multilevel lumbar spine DDD and degenerative change the bilateral hips, incompletely evaluated.  IMPRESSION: 1. Enteric contrast is again seen throughout the colon to the level of the left lower quadrant end colostomy, unchanged compared to the 01/22/2020 examination, nonspecific though could be seen in the setting of ileus as there is no significant gaseous distension of the upstream small bowel. 2. Side port of enteric tube projects over the gastric fundus. Advancement 10 cm is advised. Electronically Signed   By: Sandi Mariscal M.D.   On: 01/23/2020 07:50    Scheduled Meds: . brimonidine  2 drop Both Eyes Daily  . dorzolamide  2 drop Both Eyes Daily  . latanoprost  2 drop Both Eyes Daily  . timolol  1 drop Both Eyes Daily   Continuous Infusions: . sodium chloride 75 mL/hr at 01/23/20 0758  . cefTRIAXone (ROCEPHIN)  IV    . famotidine (PEPCID) IV       LOS: 1 day   Time spent: 40 minutes.  Lorella Nimrod, MD [facility]  If 7PM-7AM, please contact night-coverage [email or web address]  01/23/2020, 3:24 PM   This record has been created using Systems analyst. Errors have been sought and corrected,but may not always be located. Such creation errors do not reflect on the standard of care.

## 2020-01-23 NOTE — Progress Notes (Signed)
Notified Sharion Settler, NP low intermittent suction to NG tube stopped by Jovita Kussmaul, RN due to noted pink tinged output, NP instructed to slow flush with 50cc tap water, clamp then restart low intermittent suction, NG tube placement checked, flushed and low intermittent suction restarted per NP instruction, well tolerated by pt, pt now resting in bed in low position with call bell in reach and wife at bedside, will continue to monitor.

## 2020-01-24 DIAGNOSIS — K56609 Unspecified intestinal obstruction, unspecified as to partial versus complete obstruction: Secondary | ICD-10-CM | POA: Diagnosis not present

## 2020-01-24 LAB — BASIC METABOLIC PANEL
Anion gap: 13 (ref 5–15)
BUN: 35 mg/dL — ABNORMAL HIGH (ref 8–23)
CO2: 21 mmol/L — ABNORMAL LOW (ref 22–32)
Calcium: 8.9 mg/dL (ref 8.9–10.3)
Chloride: 111 mmol/L (ref 98–111)
Creatinine, Ser: 1.84 mg/dL — ABNORMAL HIGH (ref 0.61–1.24)
GFR calc Af Amer: 36 mL/min — ABNORMAL LOW (ref >60)
GFR calc non Af Amer: 31 mL/min — ABNORMAL LOW (ref >60)
Glucose, Bld: 121 mg/dL — ABNORMAL HIGH (ref 70–99)
Potassium: 3.7 mmol/L (ref 3.5–5.1)
Sodium: 145 mmol/L (ref 135–145)

## 2020-01-24 LAB — TSH: TSH: 0.495 u[IU]/mL (ref 0.350–4.500)

## 2020-01-24 LAB — URINE CULTURE

## 2020-01-24 LAB — GLUCOSE, CAPILLARY
Glucose-Capillary: 113 mg/dL — ABNORMAL HIGH (ref 70–99)
Glucose-Capillary: 116 mg/dL — ABNORMAL HIGH (ref 70–99)

## 2020-01-24 LAB — T4, FREE: Free T4: 1.15 ng/dL — ABNORMAL HIGH (ref 0.61–1.12)

## 2020-01-24 LAB — MAGNESIUM: Magnesium: 1.7 mg/dL (ref 1.7–2.4)

## 2020-01-24 MED ORDER — POTASSIUM CHLORIDE 10 MEQ/100ML IV SOLN
10.0000 meq | INTRAVENOUS | Status: DC
  Filled 2020-01-24 (×5): qty 100

## 2020-01-24 MED ORDER — LATANOPROST 0.005 % OP SOLN
2.0000 [drp] | Freq: Every day | OPHTHALMIC | Status: DC
  Administered 2020-01-24 – 2020-01-25 (×2): 2 [drp] via OPHTHALMIC
  Filled 2020-01-24 (×2): qty 2.5

## 2020-01-24 MED ORDER — MAGNESIUM SULFATE 2 GM/50ML IV SOLN
2.0000 g | Freq: Once | INTRAVENOUS | Status: AC
  Administered 2020-01-24: 2 g via INTRAVENOUS
  Filled 2020-01-24: qty 50

## 2020-01-24 MED ORDER — AMIODARONE HCL IN DEXTROSE 360-4.14 MG/200ML-% IV SOLN
30.0000 mg/h | INTRAVENOUS | Status: DC
  Administered 2020-01-24 – 2020-01-25 (×2): 30 mg/h via INTRAVENOUS
  Filled 2020-01-24 (×2): qty 200

## 2020-01-24 MED ORDER — AMIODARONE HCL IN DEXTROSE 360-4.14 MG/200ML-% IV SOLN
60.0000 mg/h | INTRAVENOUS | Status: AC
  Administered 2020-01-24: 60 mg/h via INTRAVENOUS

## 2020-01-24 MED ORDER — POTASSIUM CHLORIDE 10 MEQ/100ML IV SOLN
10.0000 meq | INTRAVENOUS | Status: AC
  Administered 2020-01-24 (×3): 10 meq via INTRAVENOUS
  Filled 2020-01-24 (×3): qty 100

## 2020-01-24 MED ORDER — AMIODARONE LOAD VIA INFUSION
150.0000 mg | Freq: Once | INTRAVENOUS | Status: AC
  Administered 2020-01-24: 150 mg via INTRAVENOUS
  Filled 2020-01-24: qty 83.34

## 2020-01-24 MED ORDER — AMIODARONE HCL IN DEXTROSE 360-4.14 MG/200ML-% IV SOLN
INTRAVENOUS | Status: AC
  Filled 2020-01-24: qty 200

## 2020-01-24 MED ORDER — DILTIAZEM HCL 25 MG/5ML IV SOLN
20.0000 mg | Freq: Once | INTRAVENOUS | Status: AC
  Administered 2020-01-24: 20 mg via INTRAVENOUS
  Filled 2020-01-24: qty 5

## 2020-01-24 NOTE — Progress Notes (Signed)
NG tube clamped.  Check residuals in 4hrs.

## 2020-01-24 NOTE — Progress Notes (Signed)
Pt received to room 258 from 1A.  Pt oriented room and call bell.  Pt blind and hoh.  Pt alert and oriented x4, mae, Skin warm and dry, no edema.  HR irregular, resp even and unlabored.  Lungs clear.  NGT to LIS w/ dark liquid in cannister.  Pulses palpable.  Pt denies pain or distress.  Afib on the monitor.  Pt is NPO and aware.  Foley draining clear, yellow urine, foley for retention.

## 2020-01-24 NOTE — Progress Notes (Signed)
PROGRESS NOTE    Jahmil Macleod  VEH:[id number] DOB: May 02, 1924 DOA: 01/22/2020 PCP: Kirk Ruths, MD   Brief Narrative:  Avary Pitsenbarger is a 86 y.o. male with medical history significant of perforated diverticulum, s/p of colostomy, hypertension, hyperlipidemia, diabetes mellitus, CKD-4, MGUS, atrial fibrillation on Eliquis, BPH, blindness, who presents with intractable nausea, vomiting.  Pt states that he started having nausea vomiting since last night, which has been progressively worsening.  He has some multiple times of nonbilious nonbloody vomiting.  He had a mild abdominal pain which has resolved. CT abdomen with concern of ileus/bowel obstruction.  Surgery was consulted and NG tube was placed.  Subjective: Overnight he developed A. fib with RVR requiring amiodarone infusion as patient was n.p.o. he was feeling better when seen today.  Accompanied by his son.  No new complaints.  Nausea or vomiting.  No abdominal pain.  Assessment & Plan:   Principal Problem:   Intractable nausea and vomiting Active Problems:   Chronic atrial fibrillation ([name])   BPH with obstruction/lower urinary tract symptoms   HTN (hypertension)   Hypokalemia   CKD (chronic kidney disease), stage IV ([name])   UTI (urinary tract infection)  Bowel obstruction/ileus.  Most likely the cause of his nausea and vomiting.  Symptoms improved with NG tube.  Surgery was consulted-has not seen him yet. Talked with Dr. Dahlia Byes and he will see him today. -Continue NG tube-will try clamping it. -Continue symptomatic management with Zofran and morphine for pain. -N.p.o. -Continue IVF at 75 mL/h  UTI.  UA concerning for infection.  Pending culture results. -Continue Rocephin for now.  Chronic atrial fibrillation ([name]) Patient developed A. fib with RVR overnight.  Rate difficult to control with IV metoprolol and diltiazem and he was started on amnioinfusion. -Rate improved with amnioinfusion. -Continue amiodarone  for now. -We will restart home medications once able to tolerate p.o. -hold Eliquis -hold oral coreg -prn IV metoprolol for heart rate >120 -TSH at very low normal, free T4 elevated at 1.15.  Checking more labs.  BPH with obstruction/lower urinary tract symptoms -hold Flomax   HTN (hypertension) --Hold Hyzaar and Coreg -As needed hydralazine IV  Hypokalemia: K= 3.7  with magnesium of 1.7. -Replete electrolyte as needed and monitor.  CKD (chronic kidney disease), stage IV ([place]):  Baseline creatinine 1.9 on 06/08/2019.  His creatinine is at 1.84 today. -Monitor renal functions. -Avoid nephrotoxins.   Objective: Vitals:   01/24/20 1138 01/24/20 1159 01/24/20 1300 01/24/20 1400  BP: 102/77 104/68 115/85 115/87  Pulse:      Resp: (!) 23     Temp:      TempSrc:      SpO2:      Weight:      Height:        Intake/Output Summary (Last 24 hours) at 01/24/2020 1458 Last data filed at 01/24/2020 1431 Gross per 24 hour  Intake 419.9 ml  Output 1375 ml  Net -955.1 ml   Filed Weights   01/22/20 1038 01/24/20 0300  Weight: 63.9 kg 58.5 kg    Examination:  General exam: Frail elderly man, appears calm and comfortable.  NG tube in place Respiratory system: Clear to auscultation. Respiratory effort normal. Cardiovascular system: Irregularly irregular, no JVD,  Gastrointestinal system: Soft, nontender, nondistended, bowel sounds active with colostomy bag containing good amount of feces. Central nervous system: Alert and oriented. No focal neurological deficits.Symmetric 5 x 5 power. Extremities: No edema, no cyanosis, pulses intact and symmetrical. Psychiatry: Judgement and insight  appear normal.  DVT prophylaxis: SCDs.  We will restart Eliquis once able to take p.o. Code Status: Full Family Communication: None was updated at bedside. Disposition Plan:  Status is: Inpatient  Remains inpatient appropriate because:Inpatient level of care appropriate due to severity of  illness   Dispo: The patient is from: Home              Anticipated d/c is to: Home              Anticipated d/c date is: 3 days              Patient currently is not medically stable to d/c.  With bowel obstruction and NG tube.  Going through conservative management.  Might need surgery if it fails.  Consultants:   Surgery  Procedures:  Antimicrobials:  Rocephin  Data Reviewed: I have personally reviewed following labs and imaging studies  CBC: Recent Labs  Lab 01/22/20 1034 01/23/20 0457  WBC 9.7 7.8  HGB 14.7 13.2  HCT 43.6 37.8*  MCV 94.8 93.6  PLT 187 702   Basic Metabolic Panel: Recent Labs  Lab 01/22/20 1034 01/23/20 0457 01/24/20 0006  NA 141 142 145  K 3.4* 3.4* 3.7  CL 107 109 111  CO2 24 24 21*  GLUCOSE 167* 108* 121*  BUN 37* 35* 35*  CREATININE 2.14* 2.02* 1.84*  CALCIUM 10.3 9.3 8.9  MG  --  1.6* 1.7   GFR: Estimated Creatinine Clearance: 20.3 mL/min (A) (by C-G formula based on SCr of 1.84 mg/dL (H)). Liver Function Tests: Recent Labs  Lab 01/22/20 1034  AST 15  ALT 10  ALKPHOS 57  BILITOT 1.5*  PROT 6.9  ALBUMIN 3.9   Recent Labs  Lab 01/22/20 1034  LIPASE 21   No results for input(s): AMMONIA in the last 168 hours. Coagulation Profile: No results for input(s): INR, PROTIME in the last 168 hours. Cardiac Enzymes: No results for input(s): CKTOTAL, CKMB, CKMBINDEX, TROPONINI in the last 168 hours. BNP (last 3 results) No results for input(s): PROBNP in the last 8760 hours. HbA1C: No results for input(s): HGBA1C in the last 72 hours. CBG: Recent Labs  Lab 01/22/20 2146 01/23/20 0819 01/24/20 0804 01/24/20 1208  GLUCAP 112* 83 113* 116*   Lipid Profile: No results for input(s): CHOL, HDL, LDLCALC, TRIG, CHOLHDL, LDLDIRECT in the last 72 hours. Thyroid Function Tests: Recent Labs    01/24/20 0006 01/24/20 0902  TSH 0.495  --   FREET4  --  1.15*   Anemia Panel: No results for input(s): VITAMINB12, FOLATE, FERRITIN,  TIBC, IRON, RETICCTPCT in the last 72 hours. Sepsis Labs: No results for input(s): PROCALCITON, LATICACIDVEN in the last 168 hours.  Recent Results (from the past 240 hour(s))  SARS CORONAVIRUS 2 (TAT 6-24 HRS) Nasopharyngeal Nasopharyngeal Swab     Status: None   Collection Time: 01/22/20  2:54 PM   Specimen: Nasopharyngeal Swab  Result Value Ref Range Status   SARS Coronavirus 2 NEGATIVE NEGATIVE Final    Comment: (NOTE) SARS-CoV-2 target nucleic acids are NOT DETECTED. The SARS-CoV-2 RNA is generally detectable in upper and lower respiratory specimens during the acute phase of infection. Negative results do not preclude SARS-CoV-2 infection, do not rule out co-infections with other pathogens, and should not be used as the sole basis for treatment or other patient management decisions. Negative results must be combined with clinical observations, patient history, and epidemiological information. The expected result is Negative. Fact Sheet for Patients: SugarRoll.be Fact  Sheet for Healthcare Providers: https://www.woods-mathews.com/ This test is not yet approved or cleared by the [place] FDA and  has been authorized for detection and/or diagnosis of SARS-CoV-2 by FDA under an Emergency Use Authorization (EUA). This EUA will remain  in effect (meaning this test can be used) for the duration of the COVID-19 declaration under Section 56 4(b)(1) of the Act, 21 U.S.C. section 360bbb-3(b)(1), unless the authorization is terminated or revoked sooner. Performed at [facility], [place] [address], [place], [place] [address]   Urine culture     Status: Abnormal   Collection Time: 01/22/20  2:54 PM   Specimen: Urine, Random  Result Value Ref Range Status   Specimen Description   Final    URINE, RANDOM Performed at [facility], [address] [place], [place] [address]    Special Requests   Final    NONE Performed at  [facility], [address] [place],  [address]    Culture MULTIPLE SPECIES PRESENT, SUGGEST RECOLLECTION (A)  Final   Report Status 01/24/2020 FINAL  Final     Radiology Studies: DG Abdomen 1 View  Result Date: 01/22/2020 CLINICAL DATA:  Nasogastric tube placement. EXAM: ABDOMEN - 1 VIEW COMPARISON:  Radiographs 06/06/2015. CT earlier today. FINDINGS: 1700 hours. Enteric tube projects to the level of the mid stomach. The visualized bowel gas pattern is normal. Stable appearance of the visualized lower chest. IMPRESSION: Enteric tube tip in the mid stomach. Electronically Signed   By: Richardean Sale M.D.   On: 01/22/2020 17:28   DG Abd Portable 2V  Result Date: 01/23/2020 CLINICAL DATA:  Small-bowel obstruction. EXAM: PORTABLE ABDOMEN - 2 VIEW COMPARISON:  01/22/2020; CT abdomen pelvis-01/22/2020 FINDINGS: Enteric tube tip projects over the gastric fundus however side port projects over the expected location of the gastroesophageal junction. Small amount of enteric contrast is again seen throughout the colon to the level of the left lower abdominal end colostomy, unchanged compared to the 01/22/2020 examination. No definitive distension of the upstream small bowel. Vascular calcifications overlie the left upper abdominal quadrant. Multiple phleboliths overlie the lower pelvis bilaterally. No pneumoperitoneum, pneumatosis or portal venous gas. Limited visualization of lower thorax demonstrates minimal left basilar heterogeneous opacities, likely atelectasis. Mild moderate multilevel lumbar spine DDD and degenerative change the bilateral hips, incompletely evaluated. IMPRESSION: 1. Enteric contrast is again seen throughout the colon to the level of the left lower quadrant end colostomy, unchanged compared to the 01/22/2020 examination, nonspecific though could be seen in the setting of ileus as there is no significant gaseous distension of the upstream small bowel. 2. Side port of  enteric tube projects over the gastric fundus. Advancement 10 cm is advised. Electronically Signed   By: Sandi Mariscal M.D.   On: 01/23/2020 07:50    Scheduled Meds: . brimonidine  2 drop Both Eyes Daily  . Chlorhexidine Gluconate Cloth  6 each Topical Daily  . dorzolamide  2 drop Both Eyes Daily  . latanoprost  2 drop Both Eyes QHS  . timolol  1 drop Both Eyes Daily   Continuous Infusions: . sodium chloride 75 mL/hr at 01/24/20 0152  . amiodarone 30 mg/hr (01/24/20 1158)  . cefTRIAXone (ROCEPHIN)  IV Stopped (01/23/20 1707)  . famotidine (PEPCID) IV Stopped (01/23/20 1835)     LOS: 2 days   Time spent: 40 minutes.  Lorella Nimrod, MD [facility]  If 7PM-7AM, please contact night-coverage [email or web address]  01/24/2020, 2:58 PM   This record has  been created using Systems analyst. Errors have been sought and corrected,but may not always be located. Such creation errors do not reflect on the standard of care.

## 2020-01-24 NOTE — Progress Notes (Signed)
Residuals checked as per order, no residual. NG tube pulled, pt tolerated well, advancing diet to clear liquid, will continue to monitor.

## 2020-01-24 NOTE — Consult Note (Signed)
[facility] SURGICAL CONSULTATION NOTE (initial) - cpt: 93903   HISTORY OF PRESENT ILLNESS (HPI):  86 y.o. male presented to [facility] ED on 04/11 for evaluation of nausea and emesis. Patient reports that about 24 hours prior to presentation he noticed the acute onset of nausea and emesis as well as a decrease in colostomy output. At this time, he also described some generalized crampy abdominal pain. Emesis was described as non-bloody and non-bilious. Nothing seemed to make the symptoms better. No fever, chills, cough, congestion, CP, or SOB. He does have a history of Hartman's procedure in 2016 with Dr Phoebe Perch for diverticulitis and was never reversed given his age. Work up in the ED was concerning for UTI, mild acute on chronic renal failure, and CT was concerning for possible early small bowel obstruction. He was admitted to the medicine service and NGT was placed.   Since admission, he has continued to improve. No longer with abdominal pain, distension, nausea, or emesis. NGT output in last 24 hours measured at 800 ccs. He has had some colostomy output but nothing measured.   Surgery is consulted by hospitalist physician Dr. Ivor Costa MD in this context for evaluation and management of SBO.    PAST MEDICAL HISTORY (PMH):  Past Medical History:  Diagnosis Date  . Anemia    anemia of chronic renal disease  . Chronic kidney disease    acute renal failure, chronic kidney disease stage III  . Diabetes mellitus without complication ([place])   . Diverticulitis   . Glaucoma   . History of hiatal hernia   . Hyperlipemia   . Hypertension   . Neuromuscular disorder ([place])    peripheral neuropathy     PAST SURGICAL HISTORY ([place]):  Past Surgical History:  Procedure Laterality Date  . BACK SURGERY    . CENTRAL VENOUS CATHETER INSERTION N/A 06/09/2015   Procedure: INSERTION CENTRAL LINE ADULT;  Surgeon: Florene Glen, MD;  Location: [facility] ORS;  Service: General;  Laterality:  N/A;  . COLECTOMY WITH COLOSTOMY CREATION/HARTMANN PROCEDURE N/A 06/09/2015   Procedure: COLECTOMY WITH COLOSTOMY CREATION/HARTMANN PROCEDURE;  Surgeon: Florene Glen, MD;  Location: [facility] ORS;  Service: General;  Laterality: N/A;  . HERNIA REPAIR     Two     MEDICATIONS:  Prior to Admission medications   Medication Sig Start Date End Date Taking? Authorizing Provider  apixaban (ELIQUIS) 2.5 MG TABS tablet Take 1 tablet (2.5 mg total) by mouth 2 (two) times daily. 07/25/15  Yes Nance Pear, MD  brimonidine (ALPHAGAN) 0.2 % ophthalmic solution Place 2 drops into both eyes daily. 06/01/15  Yes [provider]  carvedilol (COREG) 12.5 MG tablet  01/02/20  Yes [provider]  dorzolamide (TRUSOPT) 2 % ophthalmic solution Place 2 drops into both eyes daily. 04/21/15  Yes [provider]  latanoprost (XALATAN) 0.005 % ophthalmic solution Place 2 drops into both eyes daily. 03/10/15  Yes [provider]  losartan-hydrochlorothiazide (HYZAAR) 100-12.5 MG tablet Take 1 tablet by mouth daily. 07/25/15  Yes Nance Pear, MD  tamsulosin (FLOMAX) 0.4 MG CAPS capsule Take 1 capsule (0.4 mg total) by mouth daily. 02/22/18  Yes McGowan, Larene Beach A, PA-C  timolol (TIMOPTIC) 0.5 % ophthalmic solution  03/11/16  Yes [provider]  cyanocobalamin (V-R VITAMIN B-12) 500 MCG tablet Take by mouth.    [provider]     ALLERGIES:  Allergies  Allergen Reactions  . Finasteride Other (See Comments)    gynecomastia  SOCIAL HISTORY:  Social History   Socioeconomic History  . Marital status: Married    Spouse name: Not on file  . Number of children: Not on file  . Years of education: Not on file  . Highest education level: Not on file  Occupational History  . Not on file  Tobacco Use  . Smoking status: Never Smoker  . Smokeless tobacco: Never Used  Substance and Sexual Activity  . Alcohol use: No    Comment: quit in 1961  . Drug use: No   . Sexual activity: Not on file  Other Topics Concern  . Not on file  Social History Narrative  . Not on file   Social Determinants of Health   Financial Resource Strain:   . Difficulty of Paying Living Expenses:   Food Insecurity:   . Worried About Charity fundraiser in the Last Year:   . Arboriculturist in the Last Year:   Transportation Needs:   . Film/video editor (Medical):   Marland Kitchen Lack of Transportation (Non-Medical):   Physical Activity:   . Days of Exercise per Week:   . Minutes of Exercise per Session:   Stress:   . Feeling of Stress :   Social Connections:   . Frequency of Communication with Friends and Family:   . Frequency of Social Gatherings with Friends and Family:   . Attends Religious Services:   . Active Member of Clubs or Organizations:   . Attends Archivist Meetings:   Marland Kitchen Marital Status:   Intimate Partner Violence:   . Fear of Current or Ex-Partner:   . Emotionally Abused:   Marland Kitchen Physically Abused:   . Sexually Abused:      FAMILY HISTORY:  Family History  Problem Relation Age of Onset  . Diabetes Mother   . Diverticulitis Mother   . Kidney disease Neg Hx   . Prostate cancer Neg Hx   . Kidney cancer Neg Hx   . Bladder Cancer Neg Hx       REVIEW OF SYSTEMS:  Review of Systems  Constitutional: Negative for chills and fever.  HENT: Negative for congestion and sore throat.   Respiratory: Negative for cough and shortness of breath.   Cardiovascular: Negative for chest pain and palpitations.  Gastrointestinal: Positive for abdominal pain, nausea and vomiting. Negative for blood in stool, constipation and diarrhea.  Genitourinary: Negative for dysuria and urgency.  All other systems reviewed and are negative.   VITAL SIGNS:  Temp:  [98 F (36.7 C)-98.7 F (37.1 C)] 98 F (36.7 C) (04/13 0740) Pulse Rate:  [67-108] 108 (04/13 0740) Resp:  [16-27] 23 (04/13 1138) BP: (91-177)/(68-94) 115/87 (04/13 1400) SpO2:  [93 %-100 %] 99 %  (04/13 0740) Weight:  [58.5 kg] 58.5 kg (04/13 0300)     Height: 5\' 5"  (165.1 cm) Weight: 58.5 kg BMI (Calculated): 21.47   INTAKE/OUTPUT:  04/12 0701 - 04/13 0700 In: 1538.6 [I.V.:1018.6; IV Piggyback:520] Out: 2400 [Urine:1600; Emesis/NG output:800]  PHYSICAL EXAM:  Physical Exam Vitals and nursing note reviewed.  Constitutional:      General: He is not in acute distress.    Appearance: Normal appearance. He is not ill-appearing.  HENT:     Head: Normocephalic and atraumatic.     Comments: NGT in palce    Right Ear: Decreased hearing noted.     Left Ear: Decreased hearing noted.     Ears:     Comments: Hard of hearing Eyes:  General: No scleral icterus.    Conjunctiva/sclera: Conjunctivae normal.  Cardiovascular:     Rate and Rhythm: Normal rate and regular rhythm.     Pulses: Normal pulses.     Heart sounds: No murmur.  Pulmonary:     Effort: Pulmonary effort is normal. No respiratory distress.     Breath sounds: Normal breath sounds.  Abdominal:     General: Abdomen is flat. A surgical scar is present. There is no distension.     Tenderness: There is no abdominal tenderness. There is no guarding or rebound.     Hernia: A hernia (Parastomal) is present.     Comments: Abdomen is soft, non-distended, non-tender, no rebound or guarding. He does have a colostomy in the LLQ, this is pink and patient, there is gas and stool in the bag. He also has a parastomal hernia appreciable on the left near colostomy, this is soft and reducible  Genitourinary:    Comments: Deferred Musculoskeletal:     Right lower leg: No edema.     Left lower leg: No edema.  Skin:    General: Skin is warm and dry.  Neurological:     General: No focal deficit present.     Mental Status: He is alert. Mental status is at baseline.  Psychiatric:        Mood and Affect: Mood normal.        Behavior: Behavior normal.      Labs:  CBC Latest Ref Rng & Units 01/23/2020 01/22/2020 02/03/2017  WBC 4.0 -  10.5 K/uL 7.8 9.7 16.9(H)  Hemoglobin 13.0 - 17.0 g/dL 13.2 14.7 13.5  Hematocrit 39.0 - 52.0 % 37.8(L) 43.6 40.4  Platelets 150 - 400 K/uL 171 187 232   CMP Latest Ref Rng & Units 01/24/2020 01/23/2020 01/22/2020  Glucose 70 - 99 mg/dL 121(H) 108(H) 167(H)  BUN 8 - 23 mg/dL 35(H) 35(H) 37(H)  Creatinine 0.61 - 1.24 mg/dL 1.84(H) 2.02(H) 2.14(H)  Sodium 135 - 145 mmol/L 145 142 141  Potassium 3.5 - 5.1 mmol/L 3.7 3.4(L) 3.4(L)  Chloride 98 - 111 mmol/L 111 109 107  CO2 22 - 32 mmol/L 21(L) 24 24  Calcium 8.9 - 10.3 mg/dL 8.9 9.3 10.3  Total Protein 6.5 - 8.1 g/dL - - 6.9  Total Bilirubin 0.3 - 1.2 mg/dL - - 1.5(H)  Alkaline Phos 38 - 126 U/L - - 57  AST 15 - 41 U/L - - 15  ALT 0 - 44 U/L - - 10     Imaging studies:   CT Abdomen/Pelvis (01/22/2020) personally reviewed which shows gastric dilation, dilated loops of small bowel concerning for SBO and parastomal hernia with normal caliber bowel on each end, and radiologist report reviewed below;  IMPRESSION: 1. Signs of left lower quadrant colostomy for partial colonic resection with gradual transition of dilated bowel loops with relatively decompressed ileal bowel loops on today's study. Findings may represent ileus, favored over bowel obstruction. Partial small bowel obstruction could have a similar appearance. 2. Distal concentric but slightly asymmetric esophageal thickening is noted at the GE junction. Findings are nonspecific perhaps related to esophagitis and not well evaluated on the current study. Direct visualization on follow-up may be warranted. 3. Gastric distension full of ingested contrast and material. Correlate with any clinical evidence of gastro paresis 4. Stable mild left UPJ dilation. 5. Urinary bladder is less distended but remains trabeculated with small bladder diverticuli. 6. Aortic atherosclerosis.   Assessment/Plan: (ICD-10's: K53.609) 86 y.o. 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   - Recommend clamping trial of NGT prior to removal, clamp x4 hours, check residuals, if less than 150 ccs then it is okay to remove NGT and initiate diet   - Continue NPO for now  - IVF resuscitation   - monitor abdominal examination; on-going bowel function  - pain control prn; antiemetics prn  - No surgical intervention  - medical management of comorbid conditions; okay to resume PO medications once NGT removed   - Further management per primary service; we will follow  All of the above findings and recommendations were discussed with the patient and his family, and all of patient's and his family's questions were answered to their expressed satisfaction.  Thank you for the opportunity to participate in this patient's care.   -- Edison Simon, PA-C [facility] 01/24/2020, 3:30 PM [phone] M-F: 7am - 4pm

## 2020-01-24 NOTE — Progress Notes (Signed)
Handoff from Alcide Evener, RN.  Patient reconnected to intermittent/low suction.  On amio gtt at 33.3 with patient running afib on monitor in 110's.  Patient having no pain.  Colostomy bag intact.

## 2020-01-24 NOTE — Progress Notes (Signed)
OVERNIGHT Patient with atrial fib RVR development - minimal improvement with repeated metoprolol and cardizem doses. Transferred to progressive for amiodarone drip No anticoagulation secondary to possible need for surgery ileus vs SBO

## 2020-01-25 DIAGNOSIS — K56609 Unspecified intestinal obstruction, unspecified as to partial versus complete obstruction: Secondary | ICD-10-CM | POA: Diagnosis not present

## 2020-01-25 LAB — BASIC METABOLIC PANEL
Anion gap: 10 (ref 5–15)
BUN: 36 mg/dL — ABNORMAL HIGH (ref 8–23)
CO2: 21 mmol/L — ABNORMAL LOW (ref 22–32)
Calcium: 8.7 mg/dL — ABNORMAL LOW (ref 8.9–10.3)
Chloride: 112 mmol/L — ABNORMAL HIGH (ref 98–111)
Creatinine, Ser: 1.71 mg/dL — ABNORMAL HIGH (ref 0.61–1.24)
GFR calc Af Amer: 39 mL/min — ABNORMAL LOW (ref >60)
GFR calc non Af Amer: 34 mL/min — ABNORMAL LOW (ref >60)
Glucose, Bld: 101 mg/dL — ABNORMAL HIGH (ref 70–99)
Potassium: 3.6 mmol/L (ref 3.5–5.1)
Sodium: 143 mmol/L (ref 135–145)

## 2020-01-25 LAB — T3: T3, Total: 61 ng/dL — ABNORMAL LOW (ref 71–180)

## 2020-01-25 LAB — T3, FREE: T3, Free: 1.7 pg/mL — ABNORMAL LOW (ref 2.0–4.4)

## 2020-01-25 LAB — T4: T4, Total: 6.4 ug/dL (ref 4.5–12.0)

## 2020-01-25 LAB — GLUCOSE, CAPILLARY: Glucose-Capillary: 91 mg/dL (ref 70–99)

## 2020-01-25 MED ORDER — APIXABAN 2.5 MG PO TABS
2.5000 mg | ORAL_TABLET | Freq: Two times a day (BID) | ORAL | Status: DC
  Administered 2020-01-25 – 2020-01-26 (×3): 2.5 mg via ORAL
  Filled 2020-01-25 (×3): qty 1

## 2020-01-25 MED ORDER — FAMOTIDINE 20 MG PO TABS
20.0000 mg | ORAL_TABLET | Freq: Every day | ORAL | Status: DC
  Administered 2020-01-25 – 2020-01-26 (×2): 20 mg via ORAL
  Filled 2020-01-25 (×2): qty 1

## 2020-01-25 MED ORDER — TAMSULOSIN HCL 0.4 MG PO CAPS
0.4000 mg | ORAL_CAPSULE | Freq: Every day | ORAL | Status: DC
  Administered 2020-01-25 – 2020-01-26 (×2): 0.4 mg via ORAL
  Filled 2020-01-25 (×2): qty 1

## 2020-01-25 MED ORDER — CARVEDILOL 12.5 MG PO TABS
12.5000 mg | ORAL_TABLET | Freq: Two times a day (BID) | ORAL | Status: DC
  Administered 2020-01-25 – 2020-01-26 (×4): 12.5 mg via ORAL
  Filled 2020-01-25 (×4): qty 1

## 2020-01-25 MED ORDER — APIXABAN 2.5 MG PO TABS: 2.5000 mg | ORAL_TABLET | Freq: Two times a day (BID) | ORAL | Status: DC

## 2020-01-25 MED ORDER — LABETALOL HCL 5 MG/ML IV SOLN
10.0000 mg | INTRAVENOUS | Status: DC | PRN
  Administered 2020-01-25: 10 mg via INTRAVENOUS
  Filled 2020-01-25: qty 4

## 2020-01-25 MED ORDER — LOSARTAN POTASSIUM 50 MG PO TABS
100.0000 mg | ORAL_TABLET | Freq: Every day | ORAL | Status: DC
  Administered 2020-01-25 – 2020-01-26 (×2): 100 mg via ORAL
  Filled 2020-01-25 (×2): qty 2

## 2020-01-25 MED ORDER — HYDROCHLOROTHIAZIDE 12.5 MG PO CAPS
12.5000 mg | ORAL_CAPSULE | Freq: Every day | ORAL | Status: DC
  Administered 2020-01-25 – 2020-01-26 (×2): 12.5 mg via ORAL
  Filled 2020-01-25 (×2): qty 1

## 2020-01-25 MED ORDER — LOSARTAN POTASSIUM-HCTZ 100-12.5 MG PO TABS: 1.0000 | ORAL_TABLET | Freq: Every day | ORAL | Status: DC

## 2020-01-25 NOTE — Progress Notes (Signed)
PT Cancellation Note  Patient Details Name: Jesse Macias MRN: [id number] DOB: 1924-05-10   Cancelled Treatment:    Reason Eval/Treat Not Completed: Medical issues which prohibited therapy(Consult received and chart reviewed.  Per discussion with primary RN, patient with acutely swollen L UE; pending testing for DVT.  Will hold until results received and patient cleared for activity.)   Annalena Piatt H. Owens Shark, PT, DPT, NCS 01/25/20, 3:56 PM [phone]

## 2020-01-25 NOTE — Progress Notes (Signed)
[facility] SURGICAL PROGRESS NOTE (cpt [phone])  Hospital Day(s): 3.   Interval History: Patient seen and examined, NGT clamping trial overnight, residuals were minimal and NGT was removed. Patient reports he is feeling better this morning. No complaints of abdominal pain, nausea, or emesis. Renal function has continued to improve and is having good UO. He was started on CLD and tolerating well and continues to have good colostomy fucntion.   Of note, he does endorse to me this morning that he has frequent coughing spells, which can last an hour, specifically with swallowing. He notes it even happens with sips of water. Has never been evaluated for this.   Review of Systems:  Constitutional: denies fever, chills  HEENT: denies cough or congestion  Respiratory: denies any shortness of breath  Cardiovascular: denies chest pain or palpitations  Gastrointestinal: denies abdominal pain, N/V, or diarrhea/and bowel function as per interval history Genitourinary: denies burning with urination or urinary frequency  Vital signs in last 24 hours: [min-max] current  Temp:  [98.2 F (36.8 C)-98.6 F (37 C)] 98.3 F (36.8 C) (04/14 0333) Pulse Rate:  [66-109] 71 (04/14 0333) Resp:  [23-27] 23 (04/13 1138) BP: (102-174)/(68-127) 171/90 (04/14 0333) SpO2:  [97 %-98 %] 98 % (04/14 0333) Weight:  [61.6 kg] 61.6 kg (04/14 0333)     Height: 5\' 5"  (165.1 cm) Weight: 61.6 kg BMI (Calculated): 22.58   Intake/Output last 2 shifts:  04/13 0701 - 04/14 0700 In: 482.1 [I.V.:215.8; IV Piggyback:266.3] Out: 700 [Urine:700]   Physical Exam:  Constitutional: alert, cooperative and no distress  HENT: normocephalic without obvious abnormality  Eyes: PERRL, EOM's grossly intact and symmetric  Respiratory: breathing non-labored at rest  Cardiovascular: regular rate and sinus rhythm  Gastrointestinal: soft, non-tender, and non-distended, no rebound/guarding, colostomy in LLQ, pink and patent, lots  of gas in bag Musculoskeletal: No edema or wounds, motor and sensation grossly intact, NT    Labs:  CBC Latest Ref Rng & Units 01/23/2020 01/22/2020 02/03/2017  WBC 4.0 - 10.5 K/uL 7.8 9.7 16.9(H)  Hemoglobin 13.0 - 17.0 g/dL 13.2 14.7 13.5  Hematocrit 39.0 - 52.0 % 37.8(L) 43.6 40.4  Platelets 150 - 400 K/uL 171 187 232   CMP Latest Ref Rng & Units 01/25/2020 01/24/2020 01/23/2020  Glucose 70 - 99 mg/dL 101(H) 121(H) 108(H)  BUN 8 - 23 mg/dL 36(H) 35(H) 35(H)  Creatinine 0.61 - 1.24 mg/dL 1.71(H) 1.84(H) 2.02(H)  Sodium 135 - 145 mmol/L 143 145 142  Potassium 3.5 - 5.1 mmol/L 3.6 3.7 3.4(L)  Chloride 98 - 111 mmol/L 112(H) 111 109  CO2 22 - 32 mmol/L 21(L) 21(L) 24  Calcium 8.9 - 10.3 mg/dL 8.7(L) 8.9 9.3  Total Protein 6.5 - 8.1 g/dL - - -  Total Bilirubin 0.3 - 1.2 mg/dL - - -  Alkaline Phos 38 - 126 U/L - - -  AST 15 - 41 U/L - - -  ALT 0 - 44 U/L - - -     Imaging studies: No new pertinent imaging studies   Assessment/Plan: (ICD-10's: K79.609) 86 y.o. 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   - Advance to full liquids; would hold of on advancing diet until SLP eval  - Recommend SLP evaluation given endorsement of cough with swallowing  - Wean IVF resuscitation as diet advances             - monitor abdominal  examination; on-going bowel function             - pain control prn; antiemetics prn             - No surgical intervention             - medical management of comorbid conditions; okay to resume PO medications             - Further management per primary service   - Discharge Planning: No further surgical issues, we will sign off, he does not need surgery follow-up. Discharge once medically stable.   All of the above findings and recommendations were discussed with the patient, patient's family (son - bedside), and the medical team,  and all of patient's and family's questions were answered to their expressed satisfaction.  -- Edison Simon, PA-C [facility] 01/25/2020, 8:04 AM [phone] M-F: 7am - 4pm

## 2020-01-25 NOTE — Care Management Important Message (Signed)
Important Message  Patient Details  Name: Jesse Macias MRN: [id number] Date of Birth: 22-Jan-1924   Medicare Important Message Given:  Yes     Dannette Barbara 01/25/2020, 11:11 AM

## 2020-01-25 NOTE — Progress Notes (Signed)
PROGRESS NOTE    Jesse Macias  YQM:[id number] DOB: 1924-01-19 DOA: 01/22/2020 PCP: Kirk Ruths, MD   Brief Narrative:  Jesse Macias is a 86 y.o. male with medical history significant of perforated diverticulum, s/p of colostomy, hypertension, hyperlipidemia, diabetes mellitus, CKD-4, MGUS, atrial fibrillation on Eliquis, BPH, blindness, who presents with intractable nausea, vomiting.  Pt states that he started having nausea vomiting since last night, which has been progressively worsening.  He has some multiple times of nonbilious nonbloody vomiting.  He had a mild abdominal pain which has resolved. CT abdomen with concern of ileus/bowel obstruction.  Surgery was consulted and NG tube was placed.  Subjective: Pt's main complaint was paroxysmal coughing spells mostly at night that's been ongoing for a while.  No fever, dyspnea, chest pain, abdominal pain, N/V/D.  Tolerating Full liquid diet, having stool output in ostomy.  Nursing noted swelling around the stoma and over his right upper arm today.    Assessment & Plan:   Principal Problem:   Intractable nausea and vomiting Active Problems:   Chronic atrial fibrillation ([name])   BPH with obstruction/lower urinary tract symptoms   HTN (hypertension)   Hypokalemia   CKD (chronic kidney disease), stage IV ([name])   UTI (urinary tract infection)  Bowel obstruction/ileus.  Most likely the cause of his nausea and vomiting.  Symptoms improved with NG tube, since removed -Continue symptomatic management with Zofran  --d/c morphine --Full liquid  -d/c IVF at 75 mL/h  UTI.   -treated with 3 days of Rocephin   Chronic atrial fibrillation ([place]) Patient developed A. fib with RVR overnight.  Rate difficult to control with IV metoprolol and diltiazem and he was started on amnioinfusion. -Rate improved with amnio infusion. -restart coreg today -resume Eliquis today  BPH with obstruction/lower urinary tract symptoms -resume Flomax  today  HTN (hypertension) --resume Hyzaar and Coreg -As needed hydralazine IV  Hypokalemia:  -Replete electrolyte as needed and monitor.  CKD (chronic kidney disease), stage IV ([place]):  Baseline creatinine 1.9 on 06/08/2019.   -Avoid nephrotoxins.   Objective: Vitals:   01/25/20 1053 01/25/20 1151 01/25/20 1609 01/25/20 1926  BP: (!) 152/100 (!) 154/84 (!) 147/82 128/64  Pulse: (!) 59 61 62 69  Resp:  15 15   Temp:  98.4 F (36.9 C) 98 F (36.7 C) 98 F (36.7 C)  TempSrc:    Oral  SpO2:  98% 98% 100%  Weight:      Height:        Intake/Output Summary (Last 24 hours) at 01/25/2020 1954 Last data filed at 01/25/2020 1756 Gross per 24 hour  Intake --  Output 950 ml  Net -950 ml   Filed Weights   01/22/20 1038 01/24/20 0300 01/25/20 0333  Weight: 63.9 kg 58.5 kg 61.6 kg    Examination:  Constitutional: NAD, AAOx3 HEENT: conjunctivae and lids normal, EOMI, blind, hard of hearing CV: RRR no M,R,G. Distal pulses +2.  No cyanosis.   RESP: CTA B/L, normal respiratory effort  GI: +BS, NTND, some raised puffiness around the stoma, non-tender and non-erythematous. Extremities: No effusions, edema, or tenderness in BLE.  A patch of erythema and edema above AC over right arm. SKIN: warm, dry and intact Neuro: II - XII grossly intact.  Sensation intact Psych: Normal mood and affect.     DVT prophylaxis: Eliquis  Code Status: Full Family Communication: wife updated at bedside today Disposition Plan:  Home when tolerating full diet, likely tomorrow   Consultants:  Surgery  Procedures:  Antimicrobials:  Rocephin  Data Reviewed: I have personally reviewed following labs and imaging studies  CBC: Recent Labs  Lab 01/22/20 1034 01/23/20 0457  WBC 9.7 7.8  HGB 14.7 13.2  HCT 43.6 37.8*  MCV 94.8 93.6  PLT 187 381   Basic Metabolic Panel: Recent Labs  Lab 01/22/20 1034 01/23/20 0457 01/24/20 0006 01/25/20 0614  NA 141 142 145 143  K 3.4* 3.4* 3.7 3.6   CL 107 109 111 112*  CO2 24 24 21* 21*  GLUCOSE 167* 108* 121* 101*  BUN 37* 35* 35* 36*  CREATININE 2.14* 2.02* 1.84* 1.71*  CALCIUM 10.3 9.3 8.9 8.7*  MG  --  1.6* 1.7  --    GFR: Estimated Creatinine Clearance: 23 mL/min (A) (by C-G formula based on SCr of 1.71 mg/dL (H)). Liver Function Tests: Recent Labs  Lab 01/22/20 1034  AST 15  ALT 10  ALKPHOS 57  BILITOT 1.5*  PROT 6.9  ALBUMIN 3.9   Recent Labs  Lab 01/22/20 1034  LIPASE 21   No results for input(s): AMMONIA in the last 168 hours. Coagulation Profile: No results for input(s): INR, PROTIME in the last 168 hours. Cardiac Enzymes: No results for input(s): CKTOTAL, CKMB, CKMBINDEX, TROPONINI in the last 168 hours. BNP (last 3 results) No results for input(s): PROBNP in the last 8760 hours. HbA1C: No results for input(s): HGBA1C in the last 72 hours. CBG: Recent Labs  Lab 01/22/20 2146 01/23/20 0819 01/24/20 0804 01/24/20 1208 01/25/20 0817  GLUCAP 112* 83 113* 116* 91   Lipid Profile: No results for input(s): CHOL, HDL, LDLCALC, TRIG, CHOLHDL, LDLDIRECT in the last 72 hours. Thyroid Function Tests: Recent Labs    01/24/20 0006 01/24/20 0902  TSH 0.495  --   T4TOTAL  --  6.4  FREET4  --  1.15*  T3FREE  --  1.7*   Anemia Panel: No results for input(s): VITAMINB12, FOLATE, FERRITIN, TIBC, IRON, RETICCTPCT in the last 72 hours. Sepsis Labs: No results for input(s): PROCALCITON, LATICACIDVEN in the last 168 hours.  Recent Results (from the past 240 hour(s))  SARS CORONAVIRUS 2 (TAT 6-24 HRS) Nasopharyngeal Nasopharyngeal Swab     Status: None   Collection Time: 01/22/20  2:54 PM   Specimen: Nasopharyngeal Swab  Result Value Ref Range Status   SARS Coronavirus 2 NEGATIVE NEGATIVE Final    Comment: (NOTE) SARS-CoV-2 target nucleic acids are NOT DETECTED. The SARS-CoV-2 RNA is generally detectable in upper and lower respiratory specimens during the acute phase of infection. Negative results do  not preclude SARS-CoV-2 infection, do not rule out co-infections with other pathogens, and should not be used as the sole basis for treatment or other patient management decisions. Negative results must be combined with clinical observations, patient history, and epidemiological information. The expected result is Negative. Fact Sheet for Patients: SugarRoll.be Fact Sheet for Healthcare Providers: https://www.woods-mathews.com/ This test is not yet approved or cleared by the [place] FDA and  has been authorized for detection and/or diagnosis of SARS-CoV-2 by FDA under an Emergency Use Authorization (EUA). This EUA will remain  in effect (meaning this test can be used) for the duration of the COVID-19 declaration under Section 56 4(b)(1) of the Act, 21 U.S.C. section 360bbb-3(b)(1), unless the authorization is terminated or revoked sooner. Performed at [facility], [place] [address], [place], [place] [address]   Urine culture     Status: Abnormal   Collection Time: 01/22/20  2:54 PM  Specimen: Urine, Random  Result Value Ref Range Status   Specimen Description   Final    URINE, RANDOM Performed at [facility], [address], [place], [place] [address]    Special Requests   Final    NONE Performed at [facility], [address] [place], [place] [address]    Culture MULTIPLE SPECIES PRESENT, SUGGEST RECOLLECTION (A)  Final   Report Status 01/24/2020 FINAL  Final     Radiology Studies: No results found.  Scheduled Meds: . apixaban  2.5 mg Oral BID  . brimonidine  2 drop Both Eyes Daily  . carvedilol  12.5 mg Oral BID WC  . Chlorhexidine Gluconate Cloth  6 each Topical Daily  . dorzolamide  2 drop Both Eyes Daily  . famotidine  20 mg Oral Daily  . losartan  100 mg Oral Daily   And  . hydrochlorothiazide  12.5 mg Oral Daily  . latanoprost  2 drop Both Eyes QHS  . tamsulosin  0.4 mg Oral Daily  .  timolol  1 drop Both Eyes Daily   Continuous Infusions:    LOS: 3 days    Enzo Bi, MD [facility]  If 7PM-7AM, please contact night-coverage [email or web address]  01/25/2020, 7:54 PM

## 2020-01-25 NOTE — Evaluation (Signed)
Clinical/Bedside Swallow Evaluation Patient Details  Name: Jesse Macias MRN: [id number] Date of Birth: Sep 28, 1924  Today's Date: 01/25/2020 Time: SLP Start Time (ACUTE ONLY): 0350 SLP Stop Time (ACUTE ONLY): 1710 SLP Time Calculation (min) (ACUTE ONLY): 50 min  Past Medical History:  Past Medical History:  Diagnosis Date  . Anemia    anemia of chronic renal disease  . Chronic kidney disease    acute renal failure, chronic kidney disease stage III  . Diabetes mellitus without complication ([place])   . Diverticulitis   . Glaucoma   . History of hiatal hernia   . Hyperlipemia   . Hypertension   . Neuromuscular disorder ([place])    peripheral neuropathy   Past Surgical History:  Past Surgical History:  Procedure Laterality Date  . BACK SURGERY    . CENTRAL VENOUS CATHETER INSERTION N/A 06/09/2015   Procedure: INSERTION CENTRAL LINE ADULT;  Surgeon: Florene Glen, MD;  Location: [facility] ORS;  Service: General;  Laterality: N/A;  . COLECTOMY WITH COLOSTOMY CREATION/HARTMANN PROCEDURE N/A 06/09/2015   Procedure: COLECTOMY WITH COLOSTOMY CREATION/HARTMANN PROCEDURE;  Surgeon: Florene Glen, MD;  Location: [facility] ORS;  Service: General;  Laterality: N/A;  . HERNIA REPAIR     Two   HPI:  Pt is a 86 y.o. male with medical history significant of perforated diverticulum, s/p of colostomy, hypertension, hyperlipidemia, diabetes mellitus, CKD-4, MGUS, atrial fibrillation on Eliquis, BPH, blindness, who presents with intractable nausea, vomiting.  CT of Abd. revealed: "Signs of left lower quadrant colostomy for partial colonic resection with gradual transition of dilated bowel loops with relatively decompressed ileal bowel loops on today's study. Findings may represent ileus, favored over bowel obstruction. Also, Distal concentric but slightly asymmetric esophageal thickening at the GE junction, and Gastric distension full of ingested contrast and material".  Any Esophageal dysmotility can impact  pharyngeal phase swallowing; Esophageal motility and timely clearing.  No decline in Pulmonary status reported in chart, MD.  Pt received an NG tube post admission d/t GI concerns; residuals were minimal and NGT was removed.  Pt does not appear to be on a PPI per chart orders.   Assessment / Plan / Recommendation Clinical Impression  Pt appears to present w/ adequate oropharyngeal phase swallow w/ No oropharyngeal phase dysphagia noted, No neuromuscular deficits noted. Pt consumed po trials w/ No overt, clinical s/s of aspiration during po trials. Pt appears at reduced risk for aspiration following general aspiration precautions. Pt does have presentation of Esophageal phase dysmotility per chart of Abd scan noting Distal Esophagus thickening at GE junction; and pt's c/o dry, hacking cough at Night and having to use Rolaids to ease s/s of Reflux described. During po trials, pt consumed all consistencies w/ no overt coughing, decline in vocal quality, or change in respiratory presentation during/post trials. Oral phase appeared [facility] w/ timely bolus management and control of bolus propulsion for A-P transfer for swallowing. Oral clearing achieved w/ trial consistencies. OM Exam appeared [facility] w/ no unilateral weakness noted. Speech Clear. Pt fed self w/ setup support. Limited trial consistencies d/t pt being on a Full Liquid diet for GI issues currently. Pt wears his lower Denture plate (at home) w/ meals intermittently is seems in his report. Recommend use of lower Denture plate as diet consistency upgrades to solid foods per GI. Recommend a generally Regular consistency diet w/ well-Cut meats, moistened foods(when GI ready); Thin liquids VIA CUP - pt does not use straws at home. Recommend general aspiration precautions, and general Reflux precautions.  Education given on Reflux, food consistencies, and easy to eat options; general aspiration precautions also. NSG to reconsult if any new needs arise. NSG agreed. SLP  Visit Diagnosis: Dysphagia, unspecified (R13.10)    Aspiration Risk  (reduced following general precautions)    Diet Recommendation  Mech Soft style diet w/ well-cut meats, moistened foods; Thin liquids. General aspiration precautions; REFLUX precautions.   Medication Administration: Whole meds with liquid(tolerating per NSG now)    Other  Recommendations Recommended Consults: Consider GI evaluation;Consider esophageal assessment(as indicated for further management, assessment) Oral Care Recommendations: Oral care BID;Oral care before and after PO;Patient independent with oral care Other Recommendations: (n/a)   Follow up Recommendations None      Frequency and Duration (n/a)  (n/a)       Prognosis Prognosis for Safe Diet Advancement: Good Barriers to Reach Goals: (baseline Esophageal dysmotility) Barriers/Prognosis Comment: f/u w/ GI for assessment, management as needed      Swallow Study   General Date of Onset: 01/22/20 HPI: Pt is a 86 y.o. male with medical history significant of perforated diverticulum, s/p of colostomy, hypertension, hyperlipidemia, diabetes mellitus, CKD-4, MGUS, atrial fibrillation on Eliquis, BPH, blindness, who presents with intractable nausea, vomiting.  CT of Abd. revealed: "Signs of left lower quadrant colostomy for partial colonic resection with gradual transition of dilated bowel loops with relatively decompressed ileal bowel loops on today's study. Findings may represent ileus, favored over bowel obstruction. Also, Distal concentric but slightly asymmetric esophageal thickening at the GE junction, and Gastric distension full of ingested contrast and material".  Any Esophageal dysmotility can impact pharyngeal phase swallowing; Esophageal motility and timely clearing.  No decline in Pulmonary status reported in chart, MD.  Pt received an NG tube post admission d/t GI concerns; residuals were minimal and NGT was removed.  Pt does not appear to be on a PPI  per chart orders. Type of Study: Bedside Swallow Evaluation Previous Swallow Assessment: none Diet Prior to this Study: Thin liquids(Full Liquid diet d/t GI status, issues) Temperature Spikes Noted: No(wbc 7.8) Respiratory Status: Room air History of Recent Intubation: No Behavior/Cognition: Alert;Cooperative;Pleasant mood(blind) Oral Cavity Assessment: Within Functional Limits Oral Care Completed by SLP: Yes Oral Cavity - Dentition: (upper Dentition; no lower Dentition) Vision: Impaired for self-feeding(w/ cues fxl) Self-Feeding Abilities: Able to feed self;Needs assist;Needs set up Patient Positioning: Upright in bed(needed support) Baseline Vocal Quality: Normal Volitional Cough: Strong Volitional Swallow: Able to elicit    Oral/Motor/Sensory Function Overall Oral Motor/Sensory Function: Within functional limits   Ice Chips Ice chips: Not tested   Thin Liquid Thin Liquid: Within functional limits Presentation: Cup;Self Fed(~10 trials)    Nectar Thick Nectar Thick Liquid: Not tested   Honey Thick Honey Thick Liquid: Not tested   Puree Puree: Within functional limits Presentation: Spoon(fed; trials)   Solid     Solid: Not tested Other Comments: on full liquid diet       Orinda Kenner, MS, CCC-SLP Dann[facility] Raczkowski 01/25/2020,5:24 PM

## 2020-01-26 ENCOUNTER — Inpatient Hospital Stay: Payer: Medicare Other

## 2020-01-26 LAB — BASIC METABOLIC PANEL
Anion gap: 11 (ref 5–15)
BUN: 30 mg/dL — ABNORMAL HIGH (ref 8–23)
CO2: 23 mmol/L (ref 22–32)
Calcium: 9 mg/dL (ref 8.9–10.3)
Chloride: 105 mmol/L (ref 98–111)
Creatinine, Ser: 1.74 mg/dL — ABNORMAL HIGH (ref 0.61–1.24)
GFR calc Af Amer: 38 mL/min — ABNORMAL LOW (ref >60)
GFR calc non Af Amer: 33 mL/min — ABNORMAL LOW (ref >60)
Glucose, Bld: 92 mg/dL (ref 70–99)
Potassium: 3.3 mmol/L — ABNORMAL LOW (ref 3.5–5.1)
Sodium: 139 mmol/L (ref 135–145)

## 2020-01-26 LAB — CBC
HCT: 36.1 % — ABNORMAL LOW (ref 39.0–52.0)
Hemoglobin: 12.3 g/dL — ABNORMAL LOW (ref 13.0–17.0)
MCH: 32.3 pg (ref 26.0–34.0)
MCHC: 34.1 g/dL (ref 30.0–36.0)
MCV: 94.8 fL (ref 80.0–100.0)
Platelets: 143 10*3/uL — ABNORMAL LOW (ref 150–400)
RBC: 3.81 MIL/uL — ABNORMAL LOW (ref 4.22–5.81)
RDW: 13.5 % (ref 11.5–15.5)
WBC: 8.1 10*3/uL (ref 4.0–10.5)
nRBC: 0 % (ref 0.0–0.2)

## 2020-01-26 LAB — MAGNESIUM: Magnesium: 1.7 mg/dL (ref 1.7–2.4)

## 2020-01-26 LAB — GLUCOSE, CAPILLARY: Glucose-Capillary: 87 mg/dL (ref 70–99)

## 2020-01-26 MED ORDER — FAMOTIDINE 20 MG PO TABS: 20.0000 mg | ORAL_TABLET | Freq: Two times a day (BID) | ORAL | Status: DC

## 2020-01-26 MED ORDER — POTASSIUM CHLORIDE CRYS ER 20 MEQ PO TBCR
40.0000 meq | EXTENDED_RELEASE_TABLET | Freq: Once | ORAL | Status: AC
  Administered 2020-01-26: 40 meq via ORAL
  Filled 2020-01-26: qty 2

## 2020-01-26 NOTE — Discharge Summary (Signed)
Physician Discharge Summary   Jesse Macias  male DOB: [birth year]  KKX:[id number]  PCP: Kirk Ruths, MD  Admit date: 01/22/2020 Discharge date: 01/26/2020  Admitted From: home Disposition:  home Home Health: Yes CODE STATUS: Full code  Discharge Instructions    Discharge instructions   Complete by: As directed    Please be sure you have family supervision whenever you are up and about.  Your coughing spells are likely due to acid reflux getting into your lungs.  Try some over-the-counter acid reflux medication like Pepcid to see if it helps.   Dr. Enzo Bi [facility] Course:  For full details, please see H&P, progress notes, consult notes and ancillary notes.  Briefly,  Jesse Macias a 86 y.o.malewith medical history significant ofperforated diverticulum, s/p ofcolostomy, hypertension, hyperlipidemia, diabetes mellitus, CKD-4, MGUS, atrial fibrillation on Eliquis, BPH, blindness,who presented with intractable nausea, vomiting.  Bowel obstruction/ileus.   CT abdomen with concern of ileus/bowel obstruction.  Most likely the cause of his nausea and vomiting.  Surgery was consulted and NG tube was placed.   Symptoms improved with NG tube, since removed.  Prior to discharge, pt was tolerating diet and having stool output in ostomy.  UTI Treated with 3 days of Rocephin   Afib w RVR Chronic atrial fibrillation on Eliquis Patient developed A. fib with RVR after presentation.  Rate difficult to control with IV metoprolol and diltiazem and he was started on amiodarone infusion by night team.  After rate controlled, amio gtt d/c'ed and pt resumed on Coreg.  Eliquis continued.  BPH with obstruction/lower urinary tract symptoms Continued Flomax  HTN (hypertension) Continued Hyzaar and Coreg  Hypokalemia:  Repleted electrolyte as needed and monitored  CKD (chronic kidney disease), stage IV ([place]): Baseline creatinine 1.9 on 06/08/2019. On the  day of discharge, Cr 1.74.    Discharge Diagnoses:  Principal Problem:   Intractable nausea and vomiting Active Problems:   Chronic atrial fibrillation ([name])   BPH with obstruction/lower urinary tract symptoms   HTN (hypertension)   Hypokalemia   CKD (chronic kidney disease), stage IV ([name])   UTI (urinary tract infection)    Discharge Instructions:  Allergies as of 01/26/2020      Reactions   Finasteride Other (See Comments)   gynecomastia      Medication List    TAKE these medications   apixaban 2.5 MG Tabs tablet Commonly known as: Eliquis Take 1 tablet (2.5 mg total) by mouth 2 (two) times daily.   brimonidine 0.2 % ophthalmic solution Commonly known as: ALPHAGAN Place 2 drops into both eyes daily.   carvedilol 12.5 MG tablet Commonly known as: COREG   dorzolamide 2 % ophthalmic solution Commonly known as: TRUSOPT Place 2 drops into both eyes daily.   famotidine 20 MG tablet Commonly known as: PEPCID Take 1 tablet (20 mg total) by mouth 2 (two) times daily. For acid reflux which is likely the cause of your cough.   latanoprost 0.005 % ophthalmic solution Commonly known as: XALATAN Place 2 drops into both eyes daily.   losartan-hydrochlorothiazide 100-12.5 MG tablet Commonly known as: Hyzaar Take 1 tablet by mouth daily.   tamsulosin 0.4 MG Caps capsule Commonly known as: FLOMAX Take 1 capsule (0.4 mg total) by mouth daily.   timolol 0.5 % ophthalmic solution Commonly known as: TIMOPTIC   V-R VITAMIN B-12 500 MCG tablet Generic drug: vitamin B-12 Take by mouth.       Follow-up  Information    Kirk Ruths, MD. Schedule an appointment as soon as possible for a visit in 1 week(s).   Specialty: Internal Medicine Contact information: [place] [id number] [phone]           Allergies  Allergen Reactions  . Finasteride Other (See Comments)    gynecomastia     The results of  significant diagnostics from this hospitalization (including imaging, microbiology, ancillary and laboratory) are listed below for reference.   Consultations:   Procedures/Studies: CT ABDOMEN PELVIS WO CONTRAST  Result Date: 01/22/2020 CLINICAL DATA:  Nausea vomiting, concern for bowel obstruction EXAM: CT ABDOMEN AND PELVIS WITHOUT CONTRAST TECHNIQUE: Multidetector CT imaging of the abdomen and pelvis was performed following the standard protocol without IV contrast. COMPARISON:  06/24/2015 FINDINGS: Lower chest: No consolidation or evidence of pleural effusion. No pericardial fluid. Heart is incompletely imaged. Hepatobiliary: Stable caudate cyst. No suspicious lesion on noncontrast imaging. No pericholecystic stranding. No overt biliary ductal dilation. Pancreas: Pancreas with fatty replacement, no sign of peripancreatic inflammation. Spleen: Spleen is normal size without focal lesion. Adrenals/Urinary Tract: Adrenal glands are normal. Renal cortical scarring. Mild left UPJ dilation of a left extrarenal pelvis similar to the prior study. Cortical loss bilaterally associated with the kidneys is similar to the remote prior examination. The urinary bladder is less distended but remains trabeculated with small bladder diverticuli. Stomach/Bowel: Gastric distension, filled with enteric contrast. Concentric slightly asymmetric thickening of the distal esophagus/GE junction. Signs of partial colectomy with rectal pouch and left lower quadrant colostomy. No evidence o compete f complete acute bowel obstruction, perienteric stranding or pneumatosis. There is gradual caliber change in the ileum with mild upstream dilation of small bowel loops. The appendix is normal. Colonic diverticulosis. Peristomal herniation of a knuckle of small bowel in the left lower quadrant about the ostomy without signs of obstruction at this level. Vascular/Lymphatic: Calcified plaque throughout the abdominal aorta, no sign of aneurysm.  Not well assessed given lack of intravenous contrast. Stable dilation of the celiac axis. No adenopathy in the upper abdomen or in the retroperitoneum. No pelvic lymphadenopathy. Reproductive: Prostate remains enlarged, nonspecific on CT. Other: Evidence of prior right inguinal herniorrhaphy. Peristomal herniation of small bowel loops about the left lower quadrant colostomy. Musculoskeletal: Degenerative changes in the spine and in the hips. No acute bone finding. IMPRESSION: 1. Signs of left lower quadrant colostomy for partial colonic resection with gradual transition of dilated bowel loops with relatively decompressed ileal bowel loops on today's study. Findings may represent ileus, favored over bowel obstruction. Partial small bowel obstruction could have a similar appearance. 2. Distal concentric but slightly asymmetric esophageal thickening is noted at the GE junction. Findings are nonspecific perhaps related to esophagitis and not well evaluated on the current study. Direct visualization on follow-up may be warranted. 3. Gastric distension full of ingested contrast and material. Correlate with any clinical evidence of gastro paresis 4. Stable mild left UPJ dilation. 5. Urinary bladder is less distended but remains trabeculated with small bladder diverticuli. 6. Aortic atherosclerosis. Aortic Atherosclerosis (ICD10-I70.0). Electronically Signed   By: Zetta Bills M.D.   On: 01/22/2020 14:09   DG Abdomen 1 View  Result Date: 01/22/2020 CLINICAL DATA:  Nasogastric tube placement. EXAM: ABDOMEN - 1 VIEW COMPARISON:  Radiographs 06/06/2015. CT earlier today. FINDINGS: 1700 hours. Enteric tube projects to the level of the mid stomach. The visualized bowel gas pattern is normal. Stable appearance of the visualized lower  chest. IMPRESSION: Enteric tube tip in the mid stomach. Electronically Signed   By: Richardean Sale M.D.   On: 01/22/2020 17:28   US Venous Img Upper Uni Right(DVT)  Result Date:  01/26/2020 CLINICAL DATA:  86 year old male with a history of right upper extremity swelling EXAM: RIGHT UPPER EXTREMITY VENOUS DOPPLER ULTRASOUND TECHNIQUE: Gray-scale sonography with graded compression, as well as color Doppler and duplex ultrasound were performed to evaluate the upper extremity deep venous system from the level of the subclavian vein and including the jugular, axillary, basilic, radial, ulnar and upper cephalic vein. Spectral Doppler was utilized to evaluate flow at rest and with distal augmentation maneuvers. COMPARISON:  None. FINDINGS: Contralateral Subclavian Vein: Respiratory phasicity is normal and symmetric with the symptomatic side. No evidence of thrombus. Normal compressibility. Internal Jugular Vein: No evidence of thrombus. Normal compressibility, respiratory phasicity and response to augmentation. Subclavian Vein: No evidence of thrombus. Normal compressibility, respiratory phasicity and response to augmentation. Axillary Vein: No evidence of thrombus. Normal compressibility, respiratory phasicity and response to augmentation. Cephalic Vein: Nonocclusive thrombus of the cephalic vein in the upper arm. Basilic Vein: No evidence of thrombus. Normal compressibility, respiratory phasicity and response to augmentation. Brachial Veins: No evidence of thrombus. Normal compressibility, respiratory phasicity and response to augmentation. Radial Veins: No evidence of thrombus. Normal compressibility, respiratory phasicity and response to augmentation. Ulnar Veins: No evidence of thrombus. Normal compressibility, respiratory phasicity and response to augmentation. Other Findings:  None visualized. IMPRESSION: Sonographic survey of the right upper extremity negative for DVT. Superficial thrombophlebitis involving the right cephalic vein. Electronically Signed   By: Corrie Mckusick D.O.   On: 01/26/2020 11:24   DG Abd Portable 2V  Result Date: 01/23/2020 CLINICAL DATA:  Small-bowel obstruction.  EXAM: PORTABLE ABDOMEN - 2 VIEW COMPARISON:  01/22/2020; CT abdomen pelvis-01/22/2020 FINDINGS: Enteric tube tip projects over the gastric fundus however side port projects over the expected location of the gastroesophageal junction. Small amount of enteric contrast is again seen throughout the colon to the level of the left lower abdominal end colostomy, unchanged compared to the 01/22/2020 examination. No definitive distension of the upstream small bowel. Vascular calcifications overlie the left upper abdominal quadrant. Multiple phleboliths overlie the lower pelvis bilaterally. No pneumoperitoneum, pneumatosis or portal venous gas. Limited visualization of lower thorax demonstrates minimal left basilar heterogeneous opacities, likely atelectasis. Mild moderate multilevel lumbar spine DDD and degenerative change the bilateral hips, incompletely evaluated. IMPRESSION: 1. Enteric contrast is again seen throughout the colon to the level of the left lower quadrant end colostomy, unchanged compared to the 01/22/2020 examination, nonspecific though could be seen in the setting of ileus as there is no significant gaseous distension of the upstream small bowel. 2. Side port of enteric tube projects over the gastric fundus. Advancement 10 cm is advised. Electronically Signed   By: Sandi Mariscal M.D.   On: 01/23/2020 07:50      Labs: BNP (last 3 results) No results for input(s): BNP in the last 8760 hours. Basic Metabolic Panel: Recent Labs  Lab 01/22/20 1034 01/23/20 0457 01/24/20 0006 01/25/20 0614 01/26/20 0544  NA 141 142 145 143 139  K 3.4* 3.4* 3.7 3.6 3.3*  CL 107 109 111 112* 105  CO2 24 24 21* 21* 23  GLUCOSE 167* 108* 121* 101* 92  BUN 37* 35* 35* 36* 30*  CREATININE 2.14* 2.02* 1.84* 1.71* 1.74*  CALCIUM 10.3 9.3 8.9 8.7* 9.0  MG  --  1.6* 1.7  --  1.7  Liver Function Tests: Recent Labs  Lab 01/22/20 1034  AST 15  ALT 10  ALKPHOS 57  BILITOT 1.5*  PROT 6.9  ALBUMIN 3.9   Recent  Labs  Lab 01/22/20 1034  LIPASE 21   No results for input(s): AMMONIA in the last 168 hours. CBC: Recent Labs  Lab 01/22/20 1034 01/23/20 0457 01/26/20 0544  WBC 9.7 7.8 8.1  HGB 14.7 13.2 12.3*  HCT 43.6 37.8* 36.1*  MCV 94.8 93.6 94.8  PLT 187 171 143*   Cardiac Enzymes: No results for input(s): CKTOTAL, CKMB, CKMBINDEX, TROPONINI in the last 168 hours. BNP: Invalid input(s): POCBNP CBG: Recent Labs  Lab 01/23/20 0819 01/24/20 0804 01/24/20 1208 01/25/20 0817 01/26/20 0803  GLUCAP 83 113* 116* 91 87   D-Dimer No results for input(s): DDIMER in the last 72 hours. Hgb A1c No results for input(s): HGBA1C in the last 72 hours. Lipid Profile No results for input(s): CHOL, HDL, LDLCALC, TRIG, CHOLHDL, LDLDIRECT in the last 72 hours. Thyroid function studies Recent Labs    01/24/20 0006 01/24/20 0902  TSH 0.495  --   T4TOTAL  --  6.4  T3FREE  --  1.7*   Anemia work up No results for input(s): VITAMINB12, FOLATE, FERRITIN, TIBC, IRON, RETICCTPCT in the last 72 hours. Urinalysis    Component Value Date/Time   COLORURINE YELLOW (A) 01/22/2020 1454   APPEARANCEUR CLOUDY (A) 01/22/2020 1454   APPEARANCEUR Clear 09/10/2015 1401   LABSPEC 1.014 01/22/2020 1454   PHURINE 5.0 01/22/2020 1454   GLUCOSEU NEGATIVE 01/22/2020 1454   HGBUR MODERATE (A) 01/22/2020 1454   BILIRUBINUR NEGATIVE 01/22/2020 1454   BILIRUBINUR Negative 09/10/2015 1401   KETONESUR NEGATIVE 01/22/2020 1454   PROTEINUR 30 (A) 01/22/2020 1454   NITRITE NEGATIVE 01/22/2020 1454   LEUKOCYTESUR LARGE (A) 01/22/2020 1454   Sepsis Labs Invalid input(s): PROCALCITONIN,  WBC,  LACTICIDVEN Microbiology Recent Results (from the past 240 hour(s))  SARS CORONAVIRUS 2 (TAT 6-24 HRS) Nasopharyngeal Nasopharyngeal Swab     Status: None   Collection Time: 01/22/20  2:54 PM   Specimen: Nasopharyngeal Swab  Result Value Ref Range Status   SARS Coronavirus 2 NEGATIVE NEGATIVE Final    Comment:  (NOTE) SARS-CoV-2 target nucleic acids are NOT DETECTED. The SARS-CoV-2 RNA is generally detectable in upper and lower respiratory specimens during the acute phase of infection. Negative results do not preclude SARS-CoV-2 infection, do not rule out co-infections with other pathogens, and should not be used as the sole basis for treatment or other patient management decisions. Negative results must be combined with clinical observations, patient history, and epidemiological information. The expected result is Negative. Fact Sheet for Patients: SugarRoll.be Fact Sheet for Healthcare Providers: https://www.woods-mathews.com/ This test is not yet approved or cleared by the [place] FDA and  has been authorized for detection and/or diagnosis of SARS-CoV-2 by FDA under an Emergency Use Authorization (EUA). This EUA will remain  in effect (meaning this test can be used) for the duration of the COVID-19 declaration under Section 56 4(b)(1) of the Act, 21 U.S.C. section 360bbb-3(b)(1), unless the authorization is terminated or revoked sooner. Performed at [facility], [place] [address] [place], [place] [address]   Urine culture     Status: Abnormal   Collection Time: 01/22/20  2:54 PM   Specimen: Urine, Random  Result Value Ref Range Status   Specimen Description   Final    URINE, RANDOM Performed at [facility], [place],  [place] [address]    Special Requests   Final    NONE Performed at [facility], [address] [place], [place] [address]    Culture MULTIPLE SPECIES PRESENT, SUGGEST RECOLLECTION (A)  Final   Report Status 01/24/2020 FINAL  Final     Total time spend on discharging this patient, including the last patient exam, discussing the hospital stay, instructions for ongoing care as it relates to all pertinent caregivers, as well as preparing the medical discharge records, prescriptions,  and/or referrals as applicable, is 40 minutes.    Enzo Bi, MD  [facility] 01/26/2020, 3:24 PM  If 7PM-7AM, please contact night-coverage

## 2020-01-26 NOTE — Progress Notes (Signed)
SLP Cancellation Note  Patient Details Name: Jesse Macias MRN: [id number] DOB: January 02, 1924   Cancelled treatment:       Reason Eval/Treat Not Completed: (chart reviewed; met w/ pt/family and NSG). Family/pt reported good toleration of oral diet; no significant c/o any Esophageal dysmotility, or any trouble swallowing w/ the upgraded diet(by GI). Handouts given on general Reflux precautions.  No further needs indicated at this time. NSG to reconsult if any new needs arise. Family/pt agreed.     Orinda Kenner, MS, CCC-SLP Jaun Galluzzo 01/26/2020, 11:17 AM

## 2020-01-26 NOTE — Evaluation (Signed)
Physical Therapy Evaluation Patient Details Name: Jesse Macias MRN: [id number] DOB: 07-18-1924 Today's Date: 01/26/2020   History of Present Illness  Jesse Macias is a 73yoM who comes [facility] on [facility]/11 c intractable N/V. CT abdomen/pelvis that showed possible bowel obstruction versus ileus. Now s/p [place] for R/O of UE DVT, negative. PMH: perforated diverticulum, s/p of colostomy, hypertension, hyperlipidemia, diabetes mellitus, CKD-4, MGUS, atrial fibrillation on Eliquis, BPH, blindness.  Clinical Impression  Pt admitted with above diagnosis. Pt currently with functional limitations due to the deficits listed below (see "PT Problem List"). Upon entry, pt in bed, awake and agreeable to participate. Son and wife visiting. The pt is alert and oriented x3, pleasant, conversational, but fairly HOH, hence P[place] Curia defers to wife for home setup info and PLOF- son elects to provide answers. RN later reports wife has exhibited some memory impairment over the last few days. Pt performs bed mobility with supervision, bed noted to be soaked with urine, minGuard for STS transfer. Pt flat out refuses gait belt placement. Pt AMB 56ft in room, verbal cues for wayfinding, 3LOB requiring minA for falls recovery due to poor delayed awareness. Pt eventually blames author for these LOB and still refuses to allow family to support him for OOB mobility. Family educated on absolute need for 1:1 minGuard assist for all OOB mobility at DC and that an assitive device is NOT a replacement for this. Family reports they cannot do this, fearful pt will not comply. Functional mobility assessment demonstrates increased effort/time requirements, poor tolerance, and need for physical assistance, whereas the patient performed these at a higher level of independence PTA. Pt will benefit from skilled PT intervention to increase independence and safety with basic mobility in preparation for discharge to the venue listed below.       Follow Up  Recommendations Home health PT;Supervision for mobility/OOB    Equipment Recommendations  None recommended by PT    Recommendations for Other Services       Precautions / Restrictions Precautions Precautions: Fall Restrictions Weight Bearing Restrictions: No      Mobility  Bed Mobility Overal bed mobility: Modified Independent Bed Mobility: Supine to Sit;Sit to Supine     Supine to sit: Modified independent (Device/Increase time) Sit to supine: Modified independent (Device/Increase time)      Transfers Overall transfer level: Needs assistance Equipment used: None Transfers: Sit to/from Stand Sit to Stand: Min guard         General transfer comment: pt refuses gait belt;  Ambulation/Gait Ambulation/Gait assistance: Min assist Gait Distance (Feet): 60 Feet Assistive device: None Gait Pattern/deviations: Step-to pattern     General Gait Details: verbal cues for way-finding due to visual impairment; 3 gross LOB associated with stop/start and/or turning, typically leaning toward Right, heavy Rt sway c each step 70/30 cadence asymmetry.(Poor awareness of acute balance deficits. Resistance to assitance. Resistance to use of device. Poor safety awareness.)  Stairs            Wheelchair Mobility    Modified Rankin (Stroke Patients Only)       Balance Overall balance assessment: Needs assistance           Standing balance-Leahy Scale: Poor Standing balance comment: 3 LOB over 53ft, requires minA for recovery                             Pertinent Vitals/Pain Pain Assessment: No/denies pain    Home Living Family/patient expects to  be discharged to:: Private residence Living Arrangements: Spouse/significant other Available Help at Discharge: Family Type of Home: House Home Access: Stairs to enter(typically is modI for stairs management.) Entrance Stairs-Rails: Right Entrance Stairs-Number of Steps: 3 Home Layout: One level Home  Equipment: Walker - 2 wheels;Shower seat Additional Comments: Pt/wife no longer driving. DTR helps with groceries and meals.    Prior Function           Comments: performing household distances; has macular degeneration with mostly peripheral vision intact. No recent falls history. Pt performing ADL independently.     Hand Dominance        Extremity/Trunk Assessment                Communication   Communication: HOH;Other (comment)(visually impaired.)  Cognition Arousal/Alertness: Awake/alert Behavior During Therapy: WFL for tasks assessed/performed Overall Cognitive Status: Difficult to assess                                 General Comments: mildly agitated, HOH, difficult todelineate how much of it is stubborness v poor awareness of acute deficits.      General Comments      Exercises     Assessment/Plan    PT Assessment Patient needs continued PT services  PT Problem List Decreased balance;Decreased activity tolerance;Decreased mobility;Decreased strength;Decreased safety awareness;Decreased knowledge of precautions       PT Treatment Interventions DME instruction;Gait training;Functional mobility training;Therapeutic activities;Stair training;Balance training;Therapeutic exercise;Patient/family education    PT Goals (Current goals can be found in the Care Plan section)  Acute Rehab PT Goals Patient Stated Goal: Wants to go home and AMB without assistance PT Goal Formulation: With patient/family Time For Goal Achievement: 02/09/20 Potential to Achieve Goals: Poor    Frequency Min 2X/week   Barriers to discharge Decreased caregiver support Wife not able to provide all needed care; extended family may need to increase support or hire help    Co-evaluation               AM-PAC PT "6 Clicks" Mobility  Outcome Measure Help needed turning from your back to your side while in a flat bed without using bedrails?: A Little Help needed  moving from lying on your back to sitting on the side of a flat bed without using bedrails?: A Little Help needed moving to and from a bed to a chair (including a wheelchair)?: A Little Help needed standing up from a chair using your arms (e.g., wheelchair or bedside chair)?: A Little Help needed to walk in hospital room?: A Little Help needed climbing 3-5 steps with a railing? : A Little 6 Click Score: 18    End of Session Equipment Utilized During Treatment: (pt refused gait belt) Activity Tolerance: Patient tolerated treatment well;No increased pain Patient left: in bed;with family/visitor present;with bed alarm set Nurse Communication: Mobility status PT Visit Diagnosis: Unsteadiness on feet (R26.81);Difficulty in walking, not elsewhere classified (R26.2)    Time: 1410-1430 PT Time Calculation (min) (ACUTE ONLY): 20 min   Charges:   PT Evaluation $PT Eval Moderate Complexity: 1 Mod          3:00 PM, 01/26/20 Etta Grandchild, PT, DPT Physical Therapist - [facility]  [phone] ([place])    [place] [facility] 01/26/2020, 2:55 PM

## 2020-01-26 NOTE — Discharge Instructions (Signed)

## 2020-01-26 NOTE — Progress Notes (Signed)
PT Cancellation Note  Patient Details Name: Jesse Macias MRN: [id number] DOB: Oct 27, 1923   Cancelled Treatment:    Reason Eval/Treat Not Completed: (Chart reviewed for re-attempt at evaluation. Patient still pending doppler to rule out DVT.  Will continue to follow and initiate as medically appropriate.)   Tri Chittick H. Owens Shark, PT, DPT, NCS 01/26/20, 8:23 AM [phone]

## 2021-02-21 ENCOUNTER — Other Ambulatory Visit: Payer: Self-pay

## 2021-02-21 ENCOUNTER — Ambulatory Visit
Admission: EM | Admit: 2021-02-21 | Discharge: 2021-02-21 | Disposition: A | Discharge status: Home / Self Care | Payer: Medicare Other

## 2021-02-21 ENCOUNTER — Ambulatory Visit (INDEPENDENT_AMBULATORY_CARE_PROVIDER_SITE_OTHER)
Admit: 2021-02-21 | Discharge: 2021-02-21 | Disposition: A | Discharge status: Home / Self Care | Payer: Medicare Other | Attending: Emergency Medicine | Admitting: Emergency Medicine

## 2021-02-21 DIAGNOSIS — W19XXXA Unspecified fall, initial encounter: Secondary | ICD-10-CM

## 2021-02-21 DIAGNOSIS — Z23 Encounter for immunization: Secondary | ICD-10-CM

## 2021-02-21 DIAGNOSIS — S0993XA Unspecified injury of face, initial encounter: Secondary | ICD-10-CM | POA: Diagnosis not present

## 2021-02-21 DIAGNOSIS — R519 Headache, unspecified: Secondary | ICD-10-CM | POA: Diagnosis not present

## 2021-02-21 DIAGNOSIS — I4811 Longstanding persistent atrial fibrillation: Secondary | ICD-10-CM

## 2021-02-21 MED ORDER — TETANUS-DIPHTHERIA TOXOIDS TD 5-2 LFU IM INJ
0.5000 mL | INJECTION | Freq: Once | INTRAMUSCULAR | Status: AC
  Administered 2021-02-21: 0.5 mL via INTRAMUSCULAR

## 2021-02-21 MED ORDER — MUPIROCIN CALCIUM 2 % NA OINT: TOPICAL_OINTMENT | NASAL | 0 refills | Status: DC

## 2021-02-21 NOTE — Discharge Instructions (Signed)
Keep your facial injury clean and dry and apply mupirocin ointment twice daily to prevent infection for the next 5 days.  Cover your facial injury with nonstick dressing, such as a Telfa pad, until a scab forms.  Once the scab forms you can stop applying the mupirocin ointment and leave the injury open to the air.  Make an appointment to follow-up with [facility]ier Richards for reevaluation of your atrial fibrillation.  If you have any chest pain, shortness of breath, headache, increasing dizziness, or more falls you need to go to the ER for evaluation.

## 2021-02-21 NOTE — ED Notes (Signed)
Wound cleansed and bacitracin applied. Covered with Telfa dressing.

## 2021-02-21 NOTE — ED Provider Notes (Signed)
[facility]    CSN: [id number] Arrival date & time: 02/21/21  0857      History   Chief Complaint Chief Complaint  Patient presents with  . Fall    HPI Jesse Macias is a 87 y.o. male.   HPI   87 year old male here for evaluation of fall.  Patient is here with family and he reports that he was getting up to walk to the kitchen, nearly made it all the way before he began to feel dizzy, turned around to go back to his chair and he fell.  Patient reports that he hit a stone hearth with the right side of his face on the way down.  He denies chest pain, shortness breath, palpitations, headache, numbness and tingling, or having a loss of consciousness.  Patient also denies nausea and vomiting.  Patient is nearly completely blind in both eyes.  He has a large skin tear to his right cheek sustained in the fall.  Past Medical History:  Diagnosis Date  . Anemia    anemia of chronic renal disease  . Chronic kidney disease    acute renal failure, chronic kidney disease stage III  . Diabetes mellitus without complication ([place])   . Diverticulitis   . Glaucoma   . History of hiatal hernia   . Hyperlipemia   . Hypertension   . Neuromuscular disorder [facility])    peripheral neuropathy    Patient Active Problem List   Diagnosis Date Noted  . Intractable nausea and vomiting 01/22/2020  . HTN (hypertension) 01/22/2020  . Hypokalemia 01/22/2020  . CKD (chronic kidney disease), stage IV ([place]) 01/22/2020  . UTI (urinary tract infection) 01/22/2020  . Dehydration   . BPH with obstruction/lower urinary tract symptoms 03/16/2016  . Aorto-iliac disease ([place]) 08/03/2015  . MGUS (monoclonal gammopathy of unknown significance) 07/31/2015  . Diverticulitis of colon without hemorrhage 07/11/2015  . Ileus ([place])   . Perforated diverticulum of large intestine   . Acute diverticulitis 06/02/2015  . Atrial fibrillation, chronic ([place]) 12/06/2014  . Chronic atrial fibrillation ([place])  12/06/2014  . Benign hypertension 05/19/2014  . HLD (hyperlipidemia) 05/19/2014  . Neuropathy 05/19/2014  . Hypotension, postural 05/19/2014    Past Surgical History:  Procedure Laterality Date  . BACK SURGERY    . CENTRAL VENOUS CATHETER INSERTION N/A 06/09/2015   Procedure: INSERTION CENTRAL LINE ADULT;  Surgeon: Florene Glen, MD;  Location: [facility] ORS;  Service: General;  Laterality: N/A;  . COLECTOMY WITH COLOSTOMY CREATION/HARTMANN PROCEDURE N/A 06/09/2015   Procedure: COLECTOMY WITH COLOSTOMY CREATION/HARTMANN PROCEDURE;  Surgeon: Florene Glen, MD;  Location: [facility] ORS;  Service: General;  Laterality: N/A;  . HERNIA REPAIR     Two       Home Medications    Prior to Admission medications   Medication Sig Start Date End Date Taking? Authorizing Provider  mupirocin nasal ointment (BACTROBAN) 2 % Apply to  wound 2 times a day. 02/21/21  Yes Margarette Canada, NP  brimonidine (ALPHAGAN) 0.2 % ophthalmic solution Place 2 drops into both eyes daily. 06/01/15   [provider]  carvedilol (COREG) 12.5 MG tablet  01/02/20   [provider]  cyanocobalamin (V-R VITAMIN B-12) 500 MCG tablet Take by mouth.    [provider]  dorzolamide (TRUSOPT) 2 % ophthalmic solution Place 2 drops into both eyes daily. 04/21/15   [provider]  famotidine (PEPCID) 20 MG tablet Take 1 tablet (20 mg total) by mouth 2 (two) times daily.  For acid reflux which is likely the cause of your cough. 01/26/20   Enzo Bi, MD  latanoprost (XALATAN) 0.005 % ophthalmic solution Place 2 drops into both eyes daily. 03/10/15   [provider]  losartan-hydrochlorothiazide (HYZAAR) 100-12.5 MG tablet Take 1 tablet by mouth daily. 07/25/15   Nance Pear, MD  tamsulosin (FLOMAX) 0.4 MG CAPS capsule Take 1 capsule (0.4 mg total) by mouth daily. 02/22/18   Zara Council A, PA-C  timolol (TIMOPTIC) 0.5 % ophthalmic solution  03/11/16   [provider]  XARELTO 20 MG TABS  tablet Take 20 mg by mouth daily. 11/29/20   [provider]  apixaban (ELIQUIS) 2.5 MG TABS tablet Take 1 tablet (2.5 mg total) by mouth 2 (two) times daily. 07/25/15 02/21/21  Nance Pear, MD    Family History Family History  Problem Relation Age of Onset  . Diabetes Mother   . Diverticulitis Mother   . Kidney disease Neg Hx   . Prostate cancer Neg Hx   . Kidney cancer Neg Hx   . Bladder Cancer Neg Hx     Social History Social History   Tobacco Use  . Smoking status: Never Smoker  . Smokeless tobacco: Never Used  Substance Use Topics  . Alcohol use: No    Comment: quit in 1961  . Drug use: No     Allergies   Finasteride   Review of Systems Review of Systems  Constitutional: Negative for activity change, appetite change and fever.  HENT: Negative for facial swelling.   Eyes: Negative for pain.  Respiratory: Negative for shortness of breath.   Cardiovascular: Negative for chest pain and palpitations.  Musculoskeletal: Negative for arthralgias and myalgias.  Skin: Positive for wound.  Neurological: Positive for dizziness and syncope. Negative for weakness, numbness and headaches.  Hematological: Negative.   Psychiatric/Behavioral: Negative.      Physical Exam Triage Vital Signs ED Triage Vitals  Enc Vitals Group     BP 02/21/21 0909 (!) 95/53     Pulse Rate 02/21/21 0909 83     Resp 02/21/21 0909 15     Temp 02/21/21 0909 97.9 F (36.6 C)     Temp src --      SpO2 02/21/21 0909 99 %     Weight 02/21/21 0905 135 lb (61.2 kg)     Height 02/21/21 0905 5\' 3"  (1.6 m)     Head Circumference --      Peak Flow --      Pain Score 02/21/21 0905 0     Pain Loc --      Pain Edu? --      Excl. in [place]? --    No data found.  Updated Vital Signs BP (!) 95/53 (BP Location: Right Arm)   Pulse 83   Temp 97.9 F (36.6 C)   Resp 15   Ht 5\' 3"  (1.6 m)   Wt 135 lb (61.2 kg)   SpO2 99%   BMI 23.91 kg/m   Visual Acuity Right Eye Distance:   Left Eye  Distance:   Bilateral Distance:    Right Eye Near:   Left Eye Near:    Bilateral Near:     Physical Exam Vitals and nursing note reviewed.  Constitutional:      General: He is in acute distress.     Appearance: He is not toxic-appearing.  HENT:     Head: Normocephalic.     Right Ear: Tympanic membrane, ear canal and external  ear normal. There is no impacted cerumen.     Left Ear: Tympanic membrane, ear canal and external ear normal. There is no impacted cerumen.     Nose: Nose normal.  Eyes:     Conjunctiva/sclera: Conjunctivae normal.  Cardiovascular:     Rate and Rhythm: Tachycardia present. Rhythm irregular.     Pulses: Normal pulses.     Heart sounds: Normal heart sounds. No murmur heard. No gallop.   Pulmonary:     Effort: Pulmonary effort is normal.     Breath sounds: Normal breath sounds. No wheezing, rhonchi or rales.  Musculoskeletal:        General: Signs of injury present.  Skin:    General: Skin is warm and dry.     Capillary Refill: Capillary refill takes less than 2 seconds.     Findings: Erythema present.  Neurological:     General: No focal deficit present.     Mental Status: He is alert and oriented to person, place, and time.     Sensory: No sensory deficit.     Motor: No weakness.  Psychiatric:        Mood and Affect: Mood normal.        Behavior: Behavior normal.        Thought Content: Thought content normal.        Judgment: Judgment normal.      UC Treatments / Results  Labs (all labs ordered are listed, but only abnormal results are displayed) Labs Reviewed - No data to display  EKG   Radiology CT Head Wo Contrast  Result Date: 02/21/2021 CLINICAL DATA:  Fall from standing with head and face pain. EXAM: CT HEAD WITHOUT CONTRAST CT MAXILLOFACIAL WITHOUT CONTRAST TECHNIQUE: Multidetector CT imaging of the head and maxillofacial structures were performed using the standard protocol without intravenous contrast. Multiplanar CT image  reconstructions of the maxillofacial structures were also generated. COMPARISON:  None. FINDINGS: CT HEAD FINDINGS Brain: No evidence of acute infarction, hemorrhage, hydrocephalus, extra-axial collection or mass lesion/mass effect. There is mild cerebral volume loss with associated ex vacuo dilatation. Periventricular white matter hypoattenuation likely represents chronic small vessel ischemic disease. Vascular: There are vascular calcifications in the carotid siphons. Skull: Normal. Negative for fracture or focal lesion. Other: None. CT MAXILLOFACIAL FINDINGS Osseous: No fracture or mandibular dislocation. No destructive process. Orbits: Negative. No traumatic or inflammatory finding. Sinuses: There is mild right maxillary, right ethmoid, and right sphenoid sinus disease. Soft tissues: Negative. IMPRESSION: 1. No acute intracranial process. 2. No acute facial bone fracture. Electronically Signed   By: Zerita Boers M.D.   On: 02/21/2021 10:55   CT Maxillofacial Wo Contrast  Result Date: 02/21/2021 CLINICAL DATA:  Fall from standing with head and face pain. EXAM: CT HEAD WITHOUT CONTRAST CT MAXILLOFACIAL WITHOUT CONTRAST TECHNIQUE: Multidetector CT imaging of the head and maxillofacial structures were performed using the standard protocol without intravenous contrast. Multiplanar CT image reconstructions of the maxillofacial structures were also generated. COMPARISON:  None. FINDINGS: CT HEAD FINDINGS Brain: No evidence of acute infarction, hemorrhage, hydrocephalus, extra-axial collection or mass lesion/mass effect. There is mild cerebral volume loss with associated ex vacuo dilatation. Periventricular white matter hypoattenuation likely represents chronic small vessel ischemic disease. Vascular: There are vascular calcifications in the carotid siphons. Skull: Normal. Negative for fracture or focal lesion. Other: None. CT MAXILLOFACIAL FINDINGS Osseous: No fracture or mandibular dislocation. No destructive  process. Orbits: Negative. No traumatic or inflammatory finding. Sinuses: There is mild right maxillary, right  ethmoid, and right sphenoid sinus disease. Soft tissues: Negative. IMPRESSION: 1. No acute intracranial process. 2. No acute facial bone fracture. Electronically Signed   By: Zerita Boers M.D.   On: 02/21/2021 10:55    Procedures Procedures (including critical care time)  Medications Ordered in UC Medications  tetanus & diphtheria toxoids (adult) (TENIVAC) injection 0.5 mL (0.5 mLs Intramuscular Given 02/21/21 1000)    Initial Impression / Assessment and Plan / UC Course  I have reviewed the triage vital signs and the nursing notes.  Pertinent labs & imaging results that were available during my care of the patient were reviewed by me and considered in my medical decision making (see chart for details).   Very pleasant 87 year old male who suffered a fall this morning and struck a stone hearth.  He denies loss of consciousness but the fall was unwitnessed.  During the fall he sustained a large skin tear to his right cheek.  Patient is 90% blind per his and family members report.  His pupils are constricted at baseline.  Patient denies any nausea, vomiting, headache, weakness, numbness, tingling in any of his extremities.  Physical exam reveals pearly gray tympanic membranes bilaterally with a normal light reflex and clear external auditory canals.  No hemotympanum noted.  No battle sign over the mastoid process bilaterally.  Patient does have tenderness to palpation of the right inferior orbital rim but his cheeks appear symmetrical.  There is a large skin tear to the right cheek from just below the orbital rim to the middle of the nasolabial fold with missing tissue.  There is no active bleeding.  Patient denies neck pain and has been range of motion of his neck.  Bilateral grips and upper extremity strength 5/5 in bilateral lower extremity strength 5/5.  Patient is able to recount the  events of his fall.  Patient reports that he had been up and back and forth of kitchen several times and is not sure of what caused this fall and for him to become dizzy.  He denies any chest pain or feeling any palpitations.  Due to patient being on Xarelto for atrial fibrillation will obtain EKG to evaluate for arrhythmia and CT of the head to look for acute intracranial process status post fall.  Due to the tenderness of the orbital rim will also include CT of the facial bones.  EKG shows atrial fibrillation with rapid ventricular response with a ventricular rate of 152 bpm.  QRS is 76 ms, PR interval is indeterminate, QT 300 ms, QTC 477 ms.  CT head and facial bones negative for acute intrathoracic process or fractures per radiology interpretation.  Patient's heart rate has returned to 68.  Discussed with patient that the reason he felt may have been that he had an accelerated heart rate which decreased blood flow to his brain made him dizzy and made him fall.  Patient states that he has not had his carvedilol this morning.  We will discharge patient home with mupirocin ointment to apply to his face twice a day for 5 days to prevent infection from forming and have him follow-up with his cardiologist.     Final Clinical Impressions(s) / UC Diagnoses   Final diagnoses:  Fall, initial encounter  Facial injury, initial encounter  Longstanding persistent atrial fibrillation [facility])     Discharge Instructions     Keep your facial injury clean and dry and apply mupirocin ointment twice daily to prevent infection for the next 5 days.  Cover your facial injury with nonstick dressing, such as a Telfa pad, until a scab forms.  Once the scab forms you can stop applying the mupirocin ointment and leave the injury open to the air.  Make an appointment to follow-up with Frazier Richards for reevaluation of your atrial fibrillation.  If you have any chest pain, shortness of breath, headache,  increasing dizziness, or more falls you need to go to the ER for evaluation.    ED Prescriptions    Medication Sig Dispense Auth. Provider   mupirocin nasal ointment (BACTROBAN) 2 % Apply to  wound 2 times a day. 22 g Margarette Canada, NP     PDMP not reviewed this encounter.   Margarette Canada, NP 02/21/21 [phone]

## 2021-02-21 NOTE — ED Triage Notes (Addendum)
Pt states he was walking across the room this morning around 6 or 7 this am when he got dizzy and fell against the brick fire place. Sustained an avulsion  injury to right face.

## 2022-03-09 ENCOUNTER — Inpatient Hospital Stay
Admission: EM | Admit: 2022-03-09 | Discharge: 2022-03-13 | DRG: 871 | Disposition: A | Discharge status: Home Health | Payer: Medicare Other | Attending: Internal Medicine | Admitting: Internal Medicine

## 2022-03-09 ENCOUNTER — Emergency Department: Payer: Medicare Other

## 2022-03-09 ENCOUNTER — Other Ambulatory Visit: Payer: Self-pay

## 2022-03-09 DIAGNOSIS — I482 Chronic atrial fibrillation, unspecified: Secondary | ICD-10-CM | POA: Diagnosis present

## 2022-03-09 DIAGNOSIS — N138 Other obstructive and reflux uropathy: Secondary | ICD-10-CM | POA: Diagnosis present

## 2022-03-09 DIAGNOSIS — A4189 Other specified sepsis: Principal | ICD-10-CM | POA: Diagnosis present

## 2022-03-09 DIAGNOSIS — A419 Sepsis, unspecified organism: Secondary | ICD-10-CM | POA: Diagnosis present

## 2022-03-09 DIAGNOSIS — J9601 Acute respiratory failure with hypoxia: Secondary | ICD-10-CM

## 2022-03-09 DIAGNOSIS — R778 Other specified abnormalities of plasma proteins: Secondary | ICD-10-CM | POA: Diagnosis not present

## 2022-03-09 DIAGNOSIS — R6520 Severe sepsis without septic shock: Secondary | ICD-10-CM | POA: Diagnosis present

## 2022-03-09 DIAGNOSIS — L8990 Pressure ulcer of unspecified site, unspecified stage: Secondary | ICD-10-CM | POA: Diagnosis present

## 2022-03-09 DIAGNOSIS — L89151 Pressure ulcer of sacral region, stage 1: Secondary | ICD-10-CM | POA: Diagnosis present

## 2022-03-09 DIAGNOSIS — I10 Essential (primary) hypertension: Secondary | ICD-10-CM | POA: Diagnosis present

## 2022-03-09 DIAGNOSIS — E1142 Type 2 diabetes mellitus with diabetic polyneuropathy: Secondary | ICD-10-CM | POA: Diagnosis present

## 2022-03-09 DIAGNOSIS — H409 Unspecified glaucoma: Secondary | ICD-10-CM | POA: Diagnosis present

## 2022-03-09 DIAGNOSIS — E1122 Type 2 diabetes mellitus with diabetic chronic kidney disease: Secondary | ICD-10-CM | POA: Diagnosis present

## 2022-03-09 DIAGNOSIS — R404 Transient alteration of awareness: Principal | ICD-10-CM

## 2022-03-09 DIAGNOSIS — D631 Anemia in chronic kidney disease: Secondary | ICD-10-CM | POA: Diagnosis present

## 2022-03-09 DIAGNOSIS — I129 Hypertensive chronic kidney disease with stage 1 through stage 4 chronic kidney disease, or unspecified chronic kidney disease: Secondary | ICD-10-CM | POA: Diagnosis present

## 2022-03-09 DIAGNOSIS — J159 Unspecified bacterial pneumonia: Secondary | ICD-10-CM | POA: Diagnosis present

## 2022-03-09 DIAGNOSIS — N179 Acute kidney failure, unspecified: Secondary | ICD-10-CM | POA: Diagnosis present

## 2022-03-09 DIAGNOSIS — Z79899 Other long term (current) drug therapy: Secondary | ICD-10-CM | POA: Diagnosis not present

## 2022-03-09 DIAGNOSIS — E872 Acidosis, unspecified: Secondary | ICD-10-CM | POA: Diagnosis present

## 2022-03-09 DIAGNOSIS — Z66 Do not resuscitate: Secondary | ICD-10-CM | POA: Diagnosis present

## 2022-03-09 DIAGNOSIS — E1165 Type 2 diabetes mellitus with hyperglycemia: Secondary | ICD-10-CM | POA: Diagnosis present

## 2022-03-09 DIAGNOSIS — U071 COVID-19: Secondary | ICD-10-CM | POA: Diagnosis present

## 2022-03-09 DIAGNOSIS — N401 Enlarged prostate with lower urinary tract symptoms: Secondary | ICD-10-CM | POA: Diagnosis present

## 2022-03-09 DIAGNOSIS — Z7901 Long term (current) use of anticoagulants: Secondary | ICD-10-CM

## 2022-03-09 DIAGNOSIS — N184 Chronic kidney disease, stage 4 (severe): Secondary | ICD-10-CM | POA: Diagnosis present

## 2022-03-09 LAB — CBC WITH DIFFERENTIAL/PLATELET
Abs Immature Granulocytes: 0.05 10*3/uL (ref 0.00–0.07)
Basophils Absolute: 0 10*3/uL (ref 0.0–0.1)
Basophils Relative: 0 %
Eosinophils Absolute: 0 10*3/uL (ref 0.0–0.5)
Eosinophils Relative: 0 %
HCT: 37.3 % — ABNORMAL LOW (ref 39.0–52.0)
Hemoglobin: 12.4 g/dL — ABNORMAL LOW (ref 13.0–17.0)
Immature Granulocytes: 1 %
Lymphocytes Relative: 10 %
Lymphs Abs: 1 10*3/uL (ref 0.7–4.0)
MCH: 32.7 pg (ref 26.0–34.0)
MCHC: 33.2 g/dL (ref 30.0–36.0)
MCV: 98.4 fL (ref 80.0–100.0)
Monocytes Absolute: 1 10*3/uL (ref 0.1–1.0)
Monocytes Relative: 10 %
Neutro Abs: 7.7 10*3/uL (ref 1.7–7.7)
Neutrophils Relative %: 79 %
Platelets: 146 10*3/uL — ABNORMAL LOW (ref 150–400)
RBC: 3.79 MIL/uL — ABNORMAL LOW (ref 4.22–5.81)
RDW: 13.4 % (ref 11.5–15.5)
WBC: 9.7 10*3/uL (ref 4.0–10.5)
nRBC: 0 % (ref 0.0–0.2)

## 2022-03-09 LAB — COMPREHENSIVE METABOLIC PANEL
ALT: 32 U/L (ref 0–44)
AST: 37 U/L (ref 15–41)
Albumin: 2.9 g/dL — ABNORMAL LOW (ref 3.5–5.0)
Alkaline Phosphatase: 53 U/L (ref 38–126)
Anion gap: 8 (ref 5–15)
BUN: 99 mg/dL — ABNORMAL HIGH (ref 8–23)
CO2: 25 mmol/L (ref 22–32)
Calcium: 8.9 mg/dL (ref 8.9–10.3)
Chloride: 107 mmol/L (ref 98–111)
Creatinine, Ser: 3.15 mg/dL — ABNORMAL HIGH (ref 0.61–1.24)
GFR, Estimated: 17 mL/min — ABNORMAL LOW (ref >60)
Glucose, Bld: 166 mg/dL — ABNORMAL HIGH (ref 70–99)
Potassium: 3.7 mmol/L (ref 3.5–5.1)
Sodium: 140 mmol/L (ref 135–145)
Total Bilirubin: 1.1 mg/dL (ref 0.3–1.2)
Total Protein: 5.5 g/dL — ABNORMAL LOW (ref 6.5–8.1)

## 2022-03-09 LAB — CBG MONITORING, ED: Glucose-Capillary: 137 mg/dL — ABNORMAL HIGH (ref 70–99)

## 2022-03-09 LAB — URINALYSIS, COMPLETE (UACMP) WITH MICROSCOPIC
Bilirubin Urine: NEGATIVE
Glucose, UA: NEGATIVE mg/dL
Hgb urine dipstick: NEGATIVE
Ketones, ur: NEGATIVE mg/dL
Nitrite: NEGATIVE
Protein, ur: NEGATIVE mg/dL
Specific Gravity, Urine: 1.015 (ref 1.005–1.030)
pH: 5 (ref 5.0–8.0)

## 2022-03-09 LAB — LACTIC ACID, PLASMA
Lactic Acid, Venous: 2.2 mmol/L (ref 0.5–1.9)
Lactic Acid, Venous: 2.7 mmol/L (ref 0.5–1.9)

## 2022-03-09 LAB — RESP PANEL BY RT-PCR (FLU A&B, COVID) ARPGX2
Influenza A by PCR: NEGATIVE
Influenza B by PCR: NEGATIVE
SARS Coronavirus 2 by RT PCR: POSITIVE — AB

## 2022-03-09 LAB — PROTIME-INR
INR: 2.4 — ABNORMAL HIGH (ref 0.8–1.2)
Prothrombin Time: 26.1 seconds — ABNORMAL HIGH (ref 11.4–15.2)

## 2022-03-09 LAB — APTT: aPTT: 36 seconds (ref 24–36)

## 2022-03-09 MED ORDER — ZINC SULFATE 220 (50 ZN) MG PO CAPS
220.0000 mg | ORAL_CAPSULE | Freq: Every day | ORAL | Status: DC
  Administered 2022-03-09 – 2022-03-13 (×4): 220 mg via ORAL
  Filled 2022-03-09 (×5): qty 1

## 2022-03-09 MED ORDER — ONDANSETRON HCL 4 MG PO TABS: 4.0000 mg | ORAL_TABLET | Freq: Four times a day (QID) | ORAL | Status: DC | PRN

## 2022-03-09 MED ORDER — LACTATED RINGERS IV SOLN: INTRAVENOUS | Status: DC

## 2022-03-09 MED ORDER — ADULT MULTIVITAMIN W/MINERALS CH
1.0000 | ORAL_TABLET | Freq: Every day | ORAL | Status: DC
  Administered 2022-03-09 – 2022-03-13 (×4): 1 via ORAL
  Filled 2022-03-09 (×5): qty 1

## 2022-03-09 MED ORDER — SODIUM CHLORIDE 0.9 % IV SOLN
2.0000 g | INTRAVENOUS | Status: AC
  Administered 2022-03-10 – 2022-03-13 (×4): 2 g via INTRAVENOUS
  Filled 2022-03-09 (×2): qty 2
  Filled 2022-03-09: qty 20
  Filled 2022-03-09: qty 2

## 2022-03-09 MED ORDER — DEXAMETHASONE 4 MG PO TABS
6.0000 mg | ORAL_TABLET | ORAL | Status: DC
  Administered 2022-03-09 – 2022-03-12 (×4): 6 mg via ORAL
  Filled 2022-03-09: qty 1
  Filled 2022-03-09: qty 2
  Filled 2022-03-09: qty 1
  Filled 2022-03-09 (×2): qty 2

## 2022-03-09 MED ORDER — BISACODYL 10 MG RE SUPP: 10.0000 mg | Freq: Every day | RECTAL | Status: DC | PRN

## 2022-03-09 MED ORDER — ONDANSETRON HCL 4 MG/2ML IJ SOLN
4.0000 mg | Freq: Four times a day (QID) | INTRAMUSCULAR | Status: DC | PRN
Start: 2022-03-09 — End: 2022-03-13

## 2022-03-09 MED ORDER — SODIUM CHLORIDE 0.9 % IV SOLN: 2.0000 g | INTRAVENOUS | Status: DC

## 2022-03-09 MED ORDER — ACETAMINOPHEN 325 MG PO TABS
650.0000 mg | ORAL_TABLET | Freq: Four times a day (QID) | ORAL | Status: DC | PRN
  Administered 2022-03-10 – 2022-03-12 (×4): 650 mg via ORAL
  Filled 2022-03-09 (×5): qty 2

## 2022-03-09 MED ORDER — INSULIN ASPART 100 UNIT/ML IJ SOLN
0.0000 [IU] | INTRAMUSCULAR | Status: DC
  Administered 2022-03-09: 1 [IU] via SUBCUTANEOUS
  Administered 2022-03-10 – 2022-03-11 (×4): 2 [IU] via SUBCUTANEOUS
  Administered 2022-03-11 – 2022-03-12 (×3): 1 [IU] via SUBCUTANEOUS
  Administered 2022-03-12: 3 [IU] via SUBCUTANEOUS
  Administered 2022-03-12 (×2): 2 [IU] via SUBCUTANEOUS
  Administered 2022-03-12 (×2): 1 [IU] via SUBCUTANEOUS
  Administered 2022-03-13: 3 [IU] via SUBCUTANEOUS
  Administered 2022-03-13: 2 [IU] via SUBCUTANEOUS
  Administered 2022-03-13: 1 [IU] via SUBCUTANEOUS
  Administered 2022-03-13: 2 [IU] via SUBCUTANEOUS
  Administered 2022-03-13: 1 [IU] via SUBCUTANEOUS
  Filled 2022-03-09 (×17): qty 1

## 2022-03-09 MED ORDER — INSULIN DETEMIR 100 UNIT/ML ~~LOC~~ SOLN
5.0000 [IU] | Freq: Two times a day (BID) | SUBCUTANEOUS | Status: DC
  Administered 2022-03-09 – 2022-03-10 (×2): 5 [IU] via SUBCUTANEOUS
  Filled 2022-03-09 (×5): qty 0.05

## 2022-03-09 MED ORDER — BRIMONIDINE TARTRATE 0.2 % OP SOLN
2.0000 [drp] | Freq: Every day | OPHTHALMIC | Status: DC
  Administered 2022-03-10 – 2022-03-12 (×3): 2 [drp] via OPHTHALMIC
  Filled 2022-03-09: qty 5

## 2022-03-09 MED ORDER — ACETAMINOPHEN 325 MG RE SUPP: 325.0000 mg | Freq: Once | RECTAL | Status: AC

## 2022-03-09 MED ORDER — SODIUM CHLORIDE 0.9 % IV SOLN
2.0000 g | INTRAVENOUS | Status: DC
  Administered 2022-03-09: 2 g via INTRAVENOUS
  Filled 2022-03-09: qty 20

## 2022-03-09 MED ORDER — SODIUM CHLORIDE 0.9 % IV SOLN
100.0000 mg | Freq: Every day | INTRAVENOUS | Status: AC
  Administered 2022-03-10 – 2022-03-11 (×2): 100 mg via INTRAVENOUS
  Filled 2022-03-09: qty 100
  Filled 2022-03-09 (×2): qty 20

## 2022-03-09 MED ORDER — ACETAMINOPHEN 650 MG RE SUPP
650.0000 mg | Freq: Four times a day (QID) | RECTAL | Status: DC | PRN
Start: 2022-03-09 — End: 2022-03-13

## 2022-03-09 MED ORDER — LACTATED RINGERS IV BOLUS (SEPSIS)
1000.0000 mL | Freq: Once | INTRAVENOUS | Status: AC
  Administered 2022-03-09: 1000 mL via INTRAVENOUS

## 2022-03-09 MED ORDER — SODIUM CHLORIDE 0.9 % IV SOLN
200.0000 mg | Freq: Once | INTRAVENOUS | Status: AC
  Administered 2022-03-09: 200 mg via INTRAVENOUS
  Filled 2022-03-09: qty 40

## 2022-03-09 MED ORDER — LACTATED RINGERS IV BOLUS
1000.0000 mL | Freq: Once | INTRAVENOUS | Status: AC
Start: 2022-03-09 — End: 2022-03-09
  Administered 2022-03-09: 1000 mL via INTRAVENOUS

## 2022-03-09 MED ORDER — ACETAMINOPHEN 325 MG RE SUPP
RECTAL | Status: AC
  Administered 2022-03-09: 325 mg via RECTAL
  Filled 2022-03-09: qty 1

## 2022-03-09 MED ORDER — LACTATED RINGERS IV BOLUS (SEPSIS): 1000.0000 mL | Freq: Once | INTRAVENOUS | Status: DC

## 2022-03-09 MED ORDER — DORZOLAMIDE HCL 2 % OP SOLN
2.0000 [drp] | Freq: Every day | OPHTHALMIC | Status: DC
  Administered 2022-03-10 – 2022-03-12 (×3): 2 [drp] via OPHTHALMIC
  Filled 2022-03-09: qty 10

## 2022-03-09 MED ORDER — LACTATED RINGERS IV BOLUS
1000.0000 mL | Freq: Once | INTRAVENOUS | Status: AC
  Administered 2022-03-09: 1000 mL via INTRAVENOUS

## 2022-03-09 MED ORDER — GUAIFENESIN-DM 100-10 MG/5ML PO SYRP
10.0000 mL | ORAL_SOLUTION | ORAL | Status: DC | PRN
  Administered 2022-03-10 – 2022-03-11 (×4): 10 mL via ORAL
  Filled 2022-03-09 (×6): qty 10

## 2022-03-09 MED ORDER — SODIUM CHLORIDE 0.9 % IV SOLN
500.0000 mg | INTRAVENOUS | Status: DC
  Administered 2022-03-09: 500 mg via INTRAVENOUS
  Filled 2022-03-09: qty 5

## 2022-03-09 MED ORDER — SODIUM CHLORIDE 0.9 % IV SOLN: 500.0000 mg | INTRAVENOUS | Status: DC

## 2022-03-09 MED ORDER — LACTATED RINGERS IV SOLN
INTRAVENOUS | Status: AC
  Administered 2022-03-09: 100 mL/h via INTRAVENOUS

## 2022-03-09 MED ORDER — ASCORBIC ACID 500 MG PO TABS
500.0000 mg | ORAL_TABLET | Freq: Every day | ORAL | Status: DC
  Administered 2022-03-09 – 2022-03-13 (×4): 500 mg via ORAL
  Filled 2022-03-09 (×5): qty 1

## 2022-03-09 MED ORDER — SODIUM CHLORIDE 0.9 % IV SOLN
500.0000 mg | INTRAVENOUS | Status: AC
  Administered 2022-03-10 – 2022-03-13 (×4): 500 mg via INTRAVENOUS
  Filled 2022-03-09: qty 500
  Filled 2022-03-09: qty 5
  Filled 2022-03-09 (×2): qty 500

## 2022-03-09 NOTE — ED Notes (Signed)
Pt placed on bed hugger/bear warmer for temperature

## 2022-03-09 NOTE — Sepsis Progress Note (Signed)
Requested bedside nurse to change 2nd LA draw time to 2 hours after the first LA was actually drawn. The time of the first draw is in question.

## 2022-03-09 NOTE — ED Triage Notes (Signed)
Pt comes via EMs from home with c/o AMS. Per EMS pt is normally A&OX4 and walks at home. Pt now A&OX1 and unable to walk plus incontinent.  Pt is HOH. Pt denies any pain.  O2 reading at 82% RA but EMS thinks it just wasn't reading. Pt placed on 3L and now at 100%. BP-52/32/ 500 fluids and now 70/56 HR-110 CBG-181

## 2022-03-09 NOTE — Sepsis Progress Note (Signed)
Sepsis protocol is being followed by [facility]. 

## 2022-03-09 NOTE — Consult Note (Signed)
  Amiodarone Drug - Drug Interaction Consult Note  Recommendations: No changes required at this time. Would continue to monitor w/ telemetry while on azithromycin given increased QT prolonging risk  Amiodarone is metabolized by the cytochrome P450 system and therefore has the potential to cause many drug interactions. Amiodarone has an average plasma half-life of 50 days (range 20 to 100 days).   There is potential for drug interactions to occur several weeks or months after stopping treatment and the onset of drug interactions may be slow after initiating amiodarone.   '[x]'$  Beta blockers: increased risk of bradycardia, AV block and myocardial depression.  Carvedilol 12.5 mg BID w/ meals.   '[x]'$  Drugs that prolong the QT interval:  Torsades de pointes risk may be increased with concurrent use - avoid if possible.  Monitor QTc, also keep magnesium/potassium WNL if concurrent therapy can't be avoided.  Antibiotics:  Azithromycin x 5 days for cap   Thank you for allowing pharmacy to be a part of this patient's care.  Darrick Penna, PharmD, MS PGPM Clinical Pharmacist 03/09/2022 4:27 PM

## 2022-03-09 NOTE — ED Notes (Signed)
Patient's colostomy bag emptied.  Loss stool and gas noted.  Patient repositioned in bed.  Bair hugger remains at patient.  External catheter placed for patient's comfort.

## 2022-03-09 NOTE — H&P (Addendum)
History and Physical    Harden Bramer BJY:[id number] DOB: April 06, 1924 DOA: 03/09/2022  PCP: Kirk Ruths, MD  Patient coming from: home   Chief Complaint: MS change, coughing  HPI: Jesse Macias is a 88 y.o. male with medical history significant of chronic kidney disease stage III, diabetes mellitus without complication, glaucoma, hypertension, atrial fibrillation on A/C presented by EMS for coughing and MS changes. Patient has hard of hearing and is also blind, but oriented and answers appropriately.  History was given by son and the patient.  Patient's son at bedside reports for the past 2-weeks patient has been coughing.  Normally he is blind but he is able to see a little where he can go and make his coffee in the morning and go back to his seat.  Yesterday he appeared a little confused.  Today he could not do his regular morning chores and did not know where he was.  At that point family called EMS.  No fevers, shortness of breath, chest pain, abdominal pain, nausea or vomiting were reported.  Upon arrival O2 reading at 82% room air but EMS thought it just was not reading correctly.  Patient was placed on 3 L and then at 100%.    ED Course: Initial BP in the ER 52/32, was given 4 L of fluid and now sbp120.  Heart rate upon arrival 128, temperature 96.0 MAP initially 65.  Currently on 2 L satting 99%. Pertinent labs glucose 166, BUN 99, creatinine 3.15, albumin 2.9, LFTs normal ,lactic acid 2.7.  WBC 9.7, hemoglobin 12.4, hematocrit 37.3, platelets 146 (it appears to be chronic).  INR 2.4, prothrombin time 26.1. Procalcitonin pending COVID-positive.  Patient did have 2 doses of vaccination per son. Urine and blood cultures obtained by ER CT of the head no acute abnormality.  Chest x-ray no active disease.  Tortuous and partially calcified thoracic aorta.  Please note: on my exam patient responded appropriately and was Aaxox4.  Son does report his mental status improved after he was given  IV fluids   Review of Systems: All systems reviewed and otherwise negative.    Past Medical History:  Diagnosis Date   Anemia    anemia of chronic renal disease   Chronic kidney disease    acute renal failure, chronic kidney disease stage III   Diabetes mellitus without complication ([name])    Diverticulitis    Glaucoma    History of hiatal hernia    Hyperlipemia    Hypertension    Neuromuscular disorder ([place])    peripheral neuropathy    Past Surgical History:  Procedure Laterality Date   BACK SURGERY     CENTRAL VENOUS CATHETER INSERTION N/A 06/09/2015   Procedure: INSERTION CENTRAL LINE ADULT;  Surgeon: Florene Glen, MD;  Location: [facility] ORS;  Service: General;  Laterality: N/A;   COLECTOMY WITH COLOSTOMY CREATION/HARTMANN PROCEDURE N/A 06/09/2015   Procedure: COLECTOMY WITH COLOSTOMY CREATION/HARTMANN PROCEDURE;  Surgeon: Florene Glen, MD;  Location: [facility] ORS;  Service: General;  Laterality: N/A;   HERNIA REPAIR     Two     reports that he has never smoked. He has never used smokeless tobacco. He reports that he does not drink alcohol and does not use drugs.  Allergies  Allergen Reactions   Finasteride Other (See Comments)    gynecomastia    Family History  Problem Relation Age of Onset   Diabetes Mother    Diverticulitis Mother    Kidney disease Neg Hx  Prostate cancer Neg Hx    Kidney cancer Neg Hx    Bladder Cancer Neg Hx      Prior to Admission medications   Medication Sig Start Date End Date Taking? Authorizing Provider  brimonidine (ALPHAGAN) 0.2 % ophthalmic solution Place 2 drops into both eyes daily. 06/01/15  Yes [provider]  carvedilol (COREG) 12.5 MG tablet Take 12.5 mg by mouth 2 (two) times daily with a meal. 01/02/20  Yes [provider]  dorzolamide (TRUSOPT) 2 % ophthalmic solution Place 2 drops into both eyes daily. 04/21/15  Yes [provider]  losartan-hydrochlorothiazide (HYZAAR) 100-12.5 MG tablet Take  1 tablet by mouth daily. 07/25/15  Yes Nance Pear, MD  tamsulosin (FLOMAX) 0.4 MG CAPS capsule Take 1 capsule (0.4 mg total) by mouth daily. 02/22/18  Yes McGowan, Larene Beach A, PA-C  timolol (TIMOPTIC) 0.5 % ophthalmic solution  03/11/16  Yes [provider]  XARELTO 20 MG TABS tablet Take 20 mg by mouth daily. 11/29/20  Yes [provider]  famotidine (PEPCID) 20 MG tablet Take 1 tablet (20 mg total) by mouth 2 (two) times daily. For acid reflux which is likely the cause of your cough. Patient not taking: Reported on 03/09/2022 01/26/20   Enzo Bi, MD  latanoprost (XALATAN) 0.005 % ophthalmic solution Place 2 drops into both eyes daily. Patient not taking: Reported on 03/09/2022 03/10/15   [provider]  mupirocin nasal ointment (BACTROBAN) 2 % Apply to  wound 2 times a day. Patient not taking: Reported on 03/09/2022 02/21/21   Margarette Canada, NP  vitamin B-12 (CYANOCOBALAMIN) 500 MCG tablet Take by mouth. Patient not taking: Reported on 03/09/2022    [provider]  apixaban (ELIQUIS) 2.5 MG TABS tablet Take 1 tablet (2.5 mg total) by mouth 2 (two) times daily. 07/25/15 02/21/21  Nance Pear, MD    Physical Exam: Vitals:   03/09/22 1630 03/09/22 1645 03/09/22 1700 03/09/22 1806  BP: 95/71 (!) 92/58 96/62   Pulse: 73 (!) 109 (!) 112   Resp: (!) '22 13 14   '$ Temp:    (!) 96 F (35.6 C)  TempSrc:    Rectal  SpO2: 100% 96% 99%     Constitutional: NAD, calm, comfortable Vitals:   03/09/22 1630 03/09/22 1645 03/09/22 1700 03/09/22 1806  BP: 95/71 (!) 92/58 96/62   Pulse: 73 (!) 109 (!) 112   Resp: (!) '22 13 14   '$ Temp:    (!) 96 F (35.6 C)  TempSrc:    Rectal  SpO2: 100% 96% 99%    Eyes: conjunctivae normal ENMT: DMM, coughing on exam Neck: normal, supple, no masses Respiratory: mild rhonchi, no wheezing.   Cardiovascular: irregular and tachycardic. No gallop or rubs Abdomen: no tenderness, +bs, ostomy with green watery stool in  place Musculoskeletal: no clubbing / cyanosis.  Normal muscle tone.  Skin: warm, dry Neurologic: CN 2-12 grossly intact. Sensation intact,  Strength 5/5 in all 4.  Psychiatric: Normal judgment and insight. Alert and oriented x 4. Normal mood.    Labs on Admission: I have personally reviewed following labs and imaging studies  CBC: Recent Labs  Lab 03/09/22 1348  WBC 9.7  NEUTROABS 7.7  HGB 12.4*  HCT 37.3*  MCV 98.4  PLT 536*   Basic Metabolic Panel: Recent Labs  Lab 03/09/22 1348  NA 140  K 3.7  CL 107  CO2 25  GLUCOSE 166*  BUN 99*  CREATININE 3.15*  CALCIUM 8.9   GFR: CrCl  cannot be calculated (Unknown ideal weight.). Liver Function Tests: Recent Labs  Lab 03/09/22 1348  AST 37  ALT 32  ALKPHOS 53  BILITOT 1.1  PROT 5.5*  ALBUMIN 2.9*   No results for input(s): LIPASE, AMYLASE in the last 168 hours. No results for input(s): AMMONIA in the last 168 hours. Coagulation Profile: Recent Labs  Lab 03/09/22 1348  INR 2.4*   Cardiac Enzymes: No results for input(s): CKTOTAL, CKMB, CKMBINDEX, TROPONINI in the last 168 hours. BNP (last 3 results) No results for input(s): PROBNP in the last 8760 hours. HbA1C: No results for input(s): HGBA1C in the last 72 hours. CBG: No results for input(s): GLUCAP in the last 168 hours. Lipid Profile: No results for input(s): CHOL, HDL, LDLCALC, TRIG, CHOLHDL, LDLDIRECT in the last 72 hours. Thyroid Function Tests: No results for input(s): TSH, T4TOTAL, FREET4, T3FREE, THYROIDAB in the last 72 hours. Anemia Panel: No results for input(s): VITAMINB12, FOLATE, FERRITIN, TIBC, IRON, RETICCTPCT in the last 72 hours. Urine analysis:    Component Value Date/Time   COLORURINE YELLOW (A) 03/09/2022 1736   APPEARANCEUR HAZY (A) 03/09/2022 1736   APPEARANCEUR Clear 09/10/2015 1401   LABSPEC 1.015 03/09/2022 1736   PHURINE 5.0 03/09/2022 1736   GLUCOSEU NEGATIVE 03/09/2022 1736   HGBUR NEGATIVE 03/09/2022 1736   BILIRUBINUR  NEGATIVE 03/09/2022 1736   BILIRUBINUR Negative 09/10/2015 1401   KETONESUR NEGATIVE 03/09/2022 1736   PROTEINUR NEGATIVE 03/09/2022 1736   NITRITE NEGATIVE 03/09/2022 1736   LEUKOCYTESUR TRACE (A) 03/09/2022 1736    Radiological Exams on Admission: CT Head Wo Contrast  Result Date: 03/09/2022 CLINICAL DATA:  Altered mental status EXAM: CT HEAD WITHOUT CONTRAST TECHNIQUE: Contiguous axial images were obtained from the base of the skull through the vertex without intravenous contrast. RADIATION DOSE REDUCTION: This exam was performed according to the departmental dose-optimization program which includes automated exposure control, adjustment of the mA and/or kV according to patient size and/or use of iterative reconstruction technique. COMPARISON:  02/21/2021 FINDINGS: Brain: No evidence of acute infarction, hemorrhage, hydrocephalus, extra-axial collection or mass lesion/mass effect. Periventricular and deep white matter hypodensity. Small lacunar infarctions of the bilateral basal ganglia. Vascular: No hyperdense vessel or unexpected calcification. Skull: Normal. Negative for fracture or focal lesion. Sinuses/Orbits: No acute finding. Other: None. IMPRESSION: No acute intracranial pathology. Small-vessel white matter disease and small nonacute lacunar infarctions. Electronically Signed   By: Delanna Ahmadi M.D.   On: 03/09/2022 15:06   DG Chest Port 1 View  Result Date: 03/09/2022 CLINICAL DATA:  Cough for the past 2 weeks. Diminished left lower lung sounds. Possible sepsis. EXAM: PORTABLE CHEST 1 VIEW COMPARISON:  06/14/2015 FINDINGS: Normal sized heart. Tortuous and partially calcified thoracic aorta. Clear lungs with normal vascularity. Mild-to-moderate bilateral AC joint degenerative changes. IMPRESSION: No active disease. Electronically Signed   By: Claudie Revering M.D.   On: 03/09/2022 14:33    EKG: Independently reviewed.  A-fib RVR, nonspecific ST changes  Assessment/Plan Principal  Problem:   Severe sepsis ([name]) Active Problems:   Atrial fibrillation, chronic ([name])   BPH with obstruction/lower urinary tract symptoms   HTN (hypertension)   CKD (chronic kidney disease), stage IV ([name])    Severe Sepsis (HC) Patient presented with sepsis criteria including hypothermia, tachycardia, signs of endorgan damage/AKI, hypotension and lactic acidosis. Received 4 L IV fluid in ER with BP improving not needing pressors We will check procalcitonin He had taken his blood pressure medications likely adding to his hypotension Sepsis likely due to  covid pneumonia.  Started on azithromycin and ceftriaxone Trend lactic acid Blood culture and urine culture done to be obtained in ER, collected   2. Covid infection Covid test + With cough. Ck procalcitonin, if negative can dc iv abx Start covid treatment protocal Will add steroids since was hypoxic on arrival and requiring [place]. Vitamin C and Zn.  Ck d-dimer, ferritin  3.  Chronic atrial fibrillation With RVR, improving slowly with given of IVF If need to will give digoxin to avoid reducing bp Hold Xarelto as INR is  2.4  4. Essential hypertension Hold all BP meds due to sepsis/hypotension on presentation  5. AKI on CKD stage IV Likely due to sepsis/prerenal/hypotension Continue IV fluids Was given IV fluid resuscitation in ED Baseline creatinine 3 months ago was 2.0, currently 3.15 Check renal function in a.m. May have some ATN  Hold all nephrotoxic meds  6. BPH On tamsulosin, hold for now due to hypotension   7.  Diabetes mellitus type 2 with hyperglycemia Not sure what patient takes at home, will ck with pharmacy Place on RISS Ck FS Check A1c   DVT prophylaxis: scd, inr elevated Code Status: DNR, confirmed with patient  Family Communication: son at bedside  Disposition Plan: TBD  Consults called: none  Admission status: inpatient, as patient is sick and illness requires hospitalization for IV treatment,  patient will require >2MN stays.  Time spent: 85 min   Nolberto Hanlon MD [facility]   If 7PM-7AM, please contact night-coverage [email or web address] Password [facility]  03/09/2022, 6:53 PM

## 2022-03-09 NOTE — Sepsis Progress Note (Signed)
Secure chat with bedside nurse regarding delay in lactic acid result. She followed up with the lab and states that there is an issue with it and may need to be re drawn. Will follow up.

## 2022-03-09 NOTE — ED Notes (Signed)
Pt urine sample accidental drop. Pt able to use urinal. Will wait and grab urine sample when pt has to urinate again. Pt given call bell. Pt is hard of hearing and blind, but is able to use call bell. Pt understands how to push button and call bell sitting by pt hand. Son is also at bedside

## 2022-03-09 NOTE — ED Provider Notes (Signed)
[facility] Provider Note   Event Date/Time   First MD Initiated Contact with Patient 03/09/22 1356     (approximate) History  Altered Mental Status  HPI Jesse Macias is a 88 y.o. male with a past medical history of paroxysmal atrial fibrillation, CKD, hypertension who presents via EMS due to altered mental status that began this morning.  Per patient's caregiver, he came to the house this morning and states that patient did not recognize him nor did he know his situation or where he was.  Family states that this is very out of the ordinary for him.  When EMS arrived they noted patient to be hypotensive in the 70s/40s as well as hypoxic to the 80s that improved on 6 L nasal cannula.  Patient is extremely hard of hearing but denies any specific pain however he does admit to a productive cough over the past 2 weeks.  Patient currently denies any chest pain, abdominal pain, nausea/vomiting/diarrhea, or weakness/numbness/paresthesias in any extremity Physical Exam  Triage Vital Signs: ED Triage Vitals  Enc Vitals Group     BP 03/09/22 1351 (!) 86/54     Pulse Rate 03/09/22 1351 (!) 120     Resp 03/09/22 1351 (!) 25     Temp 03/09/22 1351 98.3 F (36.8 C)     Temp Source 03/09/22 1351 Oral     SpO2 03/09/22 1351 97 %     Weight --      Height --      Head Circumference --      Peak Flow --      Pain Score 03/09/22 1349 0     Pain Loc --      Pain Edu? --      Excl. in [place]? --    Most recent vital signs: Vitals:   03/10/22 0930 03/10/22 1030  BP: (!) 164/105 (!) 145/90  Pulse: 81 77  Resp: (!) 27 (!) 30  Temp:    SpO2: 99% 100%   General: Awake, oriented to self CV:  Good peripheral perfusion.  Resp:  Increased effort.  Decreased breath sounds over right lung field Abd:  No distention.  Colostomy in place with brown stool Other:  Cachectic elderly male laying in bed in no distress ED Results / Procedures / Treatments  Labs (all labs ordered are  listed, but only abnormal results are displayed) Labs Reviewed  RESP PANEL BY RT-PCR (FLU A&B, COVID) ARPGX2 - Abnormal; Notable for the following components:      Result Value   SARS Coronavirus 2 by RT PCR POSITIVE (*)    All other components within normal limits  LACTIC ACID, PLASMA - Abnormal; Notable for the following components:   Lactic Acid, Venous 2.7 (*)    All other components within normal limits  LACTIC ACID, PLASMA - Abnormal; Notable for the following components:   Lactic Acid, Venous 2.2 (*)    All other components within normal limits  COMPREHENSIVE METABOLIC PANEL - Abnormal; Notable for the following components:   Glucose, Bld 166 (*)    BUN 99 (*)    Creatinine, Ser 3.15 (*)    Total Protein 5.5 (*)    Albumin 2.9 (*)    GFR, Estimated 17 (*)    All other components within normal limits  CBC WITH DIFFERENTIAL/PLATELET - Abnormal; Notable for the following components:   RBC 3.79 (*)    Hemoglobin 12.4 (*)    HCT 37.3 (*)    Platelets  146 (*)    All other components within normal limits  PROTIME-INR - Abnormal; Notable for the following components:   Prothrombin Time 26.1 (*)    INR 2.4 (*)    All other components within normal limits  URINALYSIS, COMPLETE (UACMP) WITH MICROSCOPIC - Abnormal; Notable for the following components:   Color, Urine YELLOW (*)    APPearance HAZY (*)    Leukocytes,Ua TRACE (*)    Bacteria, UA RARE (*)    All other components within normal limits  CBC - Abnormal; Notable for the following components:   RBC 3.77 (*)    Hemoglobin 12.2 (*)    HCT 36.4 (*)    Platelets 124 (*)    All other components within normal limits  BASIC METABOLIC PANEL - Abnormal; Notable for the following components:   BUN 79 (*)    Creatinine, Ser 2.23 (*)    Calcium 8.8 (*)    GFR, Estimated 26 (*)    All other components within normal limits  FERRITIN - Abnormal; Notable for the following components:   Ferritin 372 (*)    All other components  within normal limits  D-DIMER, QUANTITATIVE - Abnormal; Notable for the following components:   D-Dimer, Quant 1.14 (*)    All other components within normal limits  HEMOGLOBIN A1C - Abnormal; Notable for the following components:   Hgb A1c MFr Bld 6.0 (*)    All other components within normal limits  LACTIC ACID, PLASMA - Abnormal; Notable for the following components:   Lactic Acid, Venous 2.9 (*)    All other components within normal limits  CBG MONITORING, ED - Abnormal; Notable for the following components:   Glucose-Capillary 137 (*)    All other components within normal limits  TROPONIN I (HIGH SENSITIVITY) - Abnormal; Notable for the following components:   Troponin I (High Sensitivity) 205 (*)    All other components within normal limits  TROPONIN I (HIGH SENSITIVITY) - Abnormal; Notable for the following components:   Troponin I (High Sensitivity) 276 (*)    All other components within normal limits  CULTURE, BLOOD (ROUTINE X 2)  URINE CULTURE  CULTURE, BLOOD (ROUTINE X 2) W REFLEX TO ID PANEL  APTT  PROCALCITONIN  PROTIME-INR  PROCALCITONIN  CORTISOL-AM, BLOOD  CBG MONITORING, ED  CBG MONITORING, ED  CBG MONITORING, ED   EKG ED ECG REPORT I, Naaman Plummer, the attending physician, personally viewed and interpreted this ECG. Date: 03/09/2022 EKG Time: 1349 Rate: 144 Rhythm: normal sinus rhythm QRS Axis: normal Intervals: normal ST/T Wave abnormalities: normal Narrative Interpretation: no evidence of acute ischemia RADIOLOGY ED MD interpretation: CT of the head without contrast interpreted by me shows no evidence of acute abnormalities including no intracerebral hemorrhage, obvious masses, or significant edema  One-view portable chest x-ray interpreted by me shows no evidence of acute abnormalities including no pneumonia, pneumothorax, or widened mediastinum -Agree with radiology assessment Official radiology report(s): CT Head Wo Contrast  Result Date:  03/09/2022 CLINICAL DATA:  Altered mental status EXAM: CT HEAD WITHOUT CONTRAST TECHNIQUE: Contiguous axial images were obtained from the base of the skull through the vertex without intravenous contrast. RADIATION DOSE REDUCTION: This exam was performed according to the departmental dose-optimization program which includes automated exposure control, adjustment of the mA and/or kV according to patient size and/or use of iterative reconstruction technique. COMPARISON:  02/21/2021 FINDINGS: Brain: No evidence of acute infarction, hemorrhage, hydrocephalus, extra-axial collection or mass lesion/mass effect. Periventricular and deep white matter  hypodensity. Small lacunar infarctions of the bilateral basal ganglia. Vascular: No hyperdense vessel or unexpected calcification. Skull: Normal. Negative for fracture or focal lesion. Sinuses/Orbits: No acute finding. Other: None. IMPRESSION: No acute intracranial pathology. Small-vessel white matter disease and small nonacute lacunar infarctions. Electronically Signed   By: Delanna Ahmadi M.D.   On: 03/09/2022 15:06   DG Chest Port 1 View  Result Date: 03/09/2022 CLINICAL DATA:  Cough for the past 2 weeks. Diminished left lower lung sounds. Possible sepsis. EXAM: PORTABLE CHEST 1 VIEW COMPARISON:  06/14/2015 FINDINGS: Normal sized heart. Tortuous and partially calcified thoracic aorta. Clear lungs with normal vascularity. Mild-to-moderate bilateral AC joint degenerative changes. IMPRESSION: No active disease. Electronically Signed   By: Claudie Revering M.D.   On: 03/09/2022 14:33   PROCEDURES: Critical Care performed: No .1-3 Lead EKG Interpretation Performed by: Naaman Plummer, MD Authorized by: Naaman Plummer, MD     Interpretation: abnormal     ECG rate:  144   ECG rate assessment: tachycardic     Rhythm: atrial fibrillation     Ectopy: none     Conduction: normal   CRITICAL CARE Performed by: Naaman Plummer  Total critical care time: 47  minutes  Critical care time was exclusive of separately billable procedures and treating other patients.  Critical care was necessary to treat or prevent imminent or life-threatening deterioration.  Critical care was time spent personally by me on the following activities: development of treatment plan with patient and/or surrogate as well as nursing, discussions with consultants, evaluation of patient's response to treatment, examination of patient, obtaining history from patient or surrogate, ordering and performing treatments and interventions, ordering and review of laboratory studies, ordering and review of radiographic studies, pulse oximetry and re-evaluation of patient's condition.  MEDICATIONS ORDERED IN ED: Medications  lactated ringers infusion ( Intravenous Rate/Dose Change 03/10/22 0833)  brimonidine (ALPHAGAN) 0.2 % ophthalmic solution 2 drop (2 drops Both Eyes Given 03/10/22 0947)  dorzolamide (TRUSOPT) 2 % ophthalmic solution 2 drop (2 drops Both Eyes Given 03/10/22 0948)  acetaminophen (TYLENOL) tablet 650 mg (has no administration in time range)    Or  acetaminophen (TYLENOL) suppository 650 mg (has no administration in time range)  bisacodyl (DULCOLAX) suppository 10 mg (has no administration in time range)  ondansetron (ZOFRAN) tablet 4 mg (has no administration in time range)    Or  ondansetron (ZOFRAN) injection 4 mg (has no administration in time range)  multivitamin with minerals tablet 1 tablet (1 tablet Oral Not Given 03/10/22 0951)  azithromycin (ZITHROMAX) 500 mg in sodium chloride 0.9 % 250 mL IVPB (has no administration in time range)  cefTRIAXone (ROCEPHIN) 2 g in sodium chloride 0.9 % 100 mL IVPB (has no administration in time range)  ascorbic acid (VITAMIN C) tablet 500 mg (500 mg Oral Not Given 03/10/22 0951)  zinc sulfate capsule 220 mg (220 mg Oral Not Given 03/10/22 0951)  remdesivir 200 mg in sodium chloride 0.9% 250 mL IVPB (0 mg Intravenous Stopped 03/09/22  2232)    Followed by  remdesivir 100 mg in sodium chloride 0.9 % 100 mL IVPB (0 mg Intravenous Stopped 03/10/22 1040)  dexamethasone (DECADRON) tablet 6 mg (6 mg Oral Given 03/09/22 2110)  guaiFENesin-dextromethorphan (ROBITUSSIN DM) 100-10 MG/5ML syrup 10 mL (10 mLs Oral Given 03/10/22 0942)  insulin detemir (LEVEMIR) injection 5 Units (5 Units Subcutaneous Given 03/09/22 2122)  insulin aspart (novoLOG) injection 0-9 Units (0 Units Subcutaneous Not Given 03/10/22 0745)  feeding supplement (  ENSURE ENLIVE / ENSURE PLUS) liquid 237 mL (237 mLs Oral Not Given 03/10/22 0952)  lactated ringers bolus 1,000 mL (0 mLs Intravenous Stopped 03/09/22 1605)    And  lactated ringers bolus 1,000 mL (0 mLs Intravenous Stopped 03/09/22 1617)  lactated ringers bolus 1,000 mL (0 mLs Intravenous Stopped 03/09/22 1814)  lactated ringers bolus 1,000 mL (0 mLs Intravenous Stopped 03/09/22 1814)  acetaminophen (TYLENOL) suppository 325 mg (325 mg Rectal Given 03/09/22 1837)   IMPRESSION / MDM / ASSESSMENT AND PLAN / ED COURSE  I reviewed the triage vital signs and the nursing notes.                             The patient is on the cardiac monitor to evaluate for evidence of arrhythmia and/or significant heart rate changes. The Pt presents with altered mental status, cough, hypotension, and tachycardia highly concerning for sepsis (suspected pulmonary source). At this time, the Pt is satting well on 3 L nasal cannula, normotensive, and appears HDS.  Will start empiric antibiotics and fluids.  Have low suspicion for a GI, skin/soft tissue, or CNS source at this time, but will reconsider if initial workup is unremarkable.  - CBC, BMP, LFTs - VBG - UA - BCx x2, Lactate - EKG - CXR - CT head - Empiric Abx: Rocephin and azithromycin - Fluids: 30cc/kg LR  Care of this patient will be signed out to the oncoming physician at the end of my shift.  All pertinent patient information conveyed and all questions answered.  All  further care and disposition decisions will be made by the oncoming physician.    FINAL CLINICAL IMPRESSION(S) / ED DIAGNOSES   Final diagnoses:  Transient alteration of awareness  Acute respiratory failure with hypoxia ([place])   Rx / DC Orders   ED Discharge Orders     None      Note:  This document was prepared using Dragon voice recognition software and may include unintentional dictation errors.   Naaman Plummer, MD 03/10/22 1049

## 2022-03-10 ENCOUNTER — Encounter: Payer: Self-pay | Admitting: Internal Medicine

## 2022-03-10 DIAGNOSIS — A419 Sepsis, unspecified organism: Secondary | ICD-10-CM | POA: Diagnosis not present

## 2022-03-10 DIAGNOSIS — R6520 Severe sepsis without septic shock: Secondary | ICD-10-CM | POA: Diagnosis not present

## 2022-03-10 LAB — CBG MONITORING, ED
Glucose-Capillary: 77 mg/dL (ref 70–99)
Glucose-Capillary: 89 mg/dL (ref 70–99)
Glucose-Capillary: 93 mg/dL (ref 70–99)

## 2022-03-10 LAB — TROPONIN I (HIGH SENSITIVITY)
Troponin I (High Sensitivity): 205 ng/L (ref <18)
Troponin I (High Sensitivity): 276 ng/L (ref <18)

## 2022-03-10 LAB — BASIC METABOLIC PANEL
Anion gap: 9 (ref 5–15)
BUN: 79 mg/dL — ABNORMAL HIGH (ref 8–23)
CO2: 22 mmol/L (ref 22–32)
Calcium: 8.8 mg/dL — ABNORMAL LOW (ref 8.9–10.3)
Chloride: 110 mmol/L (ref 98–111)
Creatinine, Ser: 2.23 mg/dL — ABNORMAL HIGH (ref 0.61–1.24)
GFR, Estimated: 26 mL/min — ABNORMAL LOW (ref >60)
Glucose, Bld: 94 mg/dL (ref 70–99)
Potassium: 3.9 mmol/L (ref 3.5–5.1)
Sodium: 141 mmol/L (ref 135–145)

## 2022-03-10 LAB — CBC
HCT: 36.4 % — ABNORMAL LOW (ref 39.0–52.0)
Hemoglobin: 12.2 g/dL — ABNORMAL LOW (ref 13.0–17.0)
MCH: 32.4 pg (ref 26.0–34.0)
MCHC: 33.5 g/dL (ref 30.0–36.0)
MCV: 96.6 fL (ref 80.0–100.0)
Platelets: 124 10*3/uL — ABNORMAL LOW (ref 150–400)
RBC: 3.77 MIL/uL — ABNORMAL LOW (ref 4.22–5.81)
RDW: 13.3 % (ref 11.5–15.5)
WBC: 8.6 10*3/uL (ref 4.0–10.5)
nRBC: 0 % (ref 0.0–0.2)

## 2022-03-10 LAB — GLUCOSE, CAPILLARY
Glucose-Capillary: 160 mg/dL — ABNORMAL HIGH (ref 70–99)
Glucose-Capillary: 165 mg/dL — ABNORMAL HIGH (ref 70–99)
Glucose-Capillary: 152 mg/dL — ABNORMAL HIGH (ref 70–99)

## 2022-03-10 LAB — PROTIME-INR
INR: 1.2 (ref 0.8–1.2)
Prothrombin Time: 14.9 seconds (ref 11.4–15.2)

## 2022-03-10 LAB — D-DIMER, QUANTITATIVE: D-Dimer, Quant: 1.14 ug/mL-FEU — ABNORMAL HIGH (ref 0.00–0.50)

## 2022-03-10 LAB — FERRITIN: Ferritin: 372 ng/mL — ABNORMAL HIGH (ref 24–336)

## 2022-03-10 LAB — HEMOGLOBIN A1C
Hgb A1c MFr Bld: 6 % — ABNORMAL HIGH (ref 4.8–5.6)
Mean Plasma Glucose: 125.5 mg/dL

## 2022-03-10 LAB — PROCALCITONIN
Procalcitonin: 0.12 ng/mL
Procalcitonin: 0.1 ng/mL

## 2022-03-10 LAB — CORTISOL-AM, BLOOD: Cortisol - AM: 9.6 ug/dL (ref 6.7–22.6)

## 2022-03-10 LAB — LACTIC ACID, PLASMA: Lactic Acid, Venous: 2.9 mmol/L (ref 0.5–1.9)

## 2022-03-10 MED ORDER — GUAIFENESIN ER 600 MG PO TB12
600.0000 mg | ORAL_TABLET | Freq: Two times a day (BID) | ORAL | Status: DC
  Administered 2022-03-10 – 2022-03-13 (×6): 600 mg via ORAL
  Filled 2022-03-10 (×6): qty 1

## 2022-03-10 MED ORDER — RIVAROXABAN 15 MG PO TABS
15.0000 mg | ORAL_TABLET | Freq: Every day | ORAL | Status: DC
  Administered 2022-03-10 – 2022-03-13 (×4): 15 mg via ORAL
  Filled 2022-03-10 (×4): qty 1

## 2022-03-10 MED ORDER — ENSURE ENLIVE PO LIQD
237.0000 mL | Freq: Two times a day (BID) | ORAL | Status: DC
  Administered 2022-03-10 – 2022-03-13 (×6): 237 mL via ORAL

## 2022-03-10 MED ORDER — CARVEDILOL 6.25 MG PO TABS
6.2500 mg | ORAL_TABLET | Freq: Two times a day (BID) | ORAL | Status: DC
  Administered 2022-03-10: 6.25 mg via ORAL
  Filled 2022-03-10: qty 1

## 2022-03-10 MED ORDER — ORAL CARE MOUTH RINSE
15.0000 mL | Freq: Two times a day (BID) | OROMUCOSAL | Status: DC
  Filled 2022-03-10: qty 15

## 2022-03-10 NOTE — TOC Initial Note (Signed)
Transition of Care [facility] - Initial/Assessment Note    Patient Details  Name: Jesse Macias MRN: [id number] Date of Birth: 1923-11-11  Transition of [facility]) CM/SW Contact:    Pete Pelt, RN Phone Number: 03/10/2022, 2:47 PM  Clinical Narrative:   RNCM spoke to patient's son Joneen Boers.  Joneen Boers states that he and his siblings help to care for patient, who is blind, and patient's spouse who currently has dementia and wanders.  Mr. Strole states spouses take turns to care for the patient and his spouse, especially during the night.  Family has also hired Financial risk analyst to assist them with care as well.  Family provides transportation and helps to obtain and dispense medications.   Family is requesting evaluation of patient for home health.  RNCM will advise care team of this request and communicate with family accordingly.  TOC to follow                Expected Discharge Plan:  (TBD) Barriers to Discharge: Continued Medical Work up   Patient Goals and CMS Choice        Expected Discharge Plan and Services Expected Discharge Plan:  (TBD)   Discharge Planning Services: CM Consult Post Acute Care Choice:  (TBD) Living arrangements for the past 2 months: Single Family Home                                      Prior Living Arrangements/Services Living arrangements for the past 2 months: Single Family Home Lives with:: Self, Spouse, Adult Children   Do you feel safe going back to the place where you live?: Yes      Need for Family Participation in Patient Care: Yes (Comment) Care giver support system in place?: Yes (comment) Current home services: Other (comment) Criminal Activity/Legal Involvement Pertinent to Current Situation/Hospitalization: No - Comment as needed  Activities of Daily Living Home Assistive Devices/Equipment: Ostomy supplies ADL Screening (condition at time of admission) Patient's cognitive ability adequate to safely complete daily  activities?: Yes Is the patient deaf or have difficulty hearing?: Yes Does the patient have difficulty seeing, even when wearing glasses/contacts?: Yes Does the patient have difficulty concentrating, remembering, or making decisions?: Yes Patient able to express need for assistance with ADLs?: Yes Does the patient have difficulty dressing or bathing?: Yes Independently performs ADLs?: No Communication: Independent Dressing (OT): Needs assistance Is this a change from baseline?: Change from baseline, expected to last <3days Grooming: Needs assistance Is this a change from baseline?: Change from baseline, expected to last <3 days Feeding: Independent Bathing: Needs assistance Is this a change from baseline?: Change from baseline, expected to last <3 days Toileting: Needs assistance Is this a change from baseline?: Change from baseline, expected to last <3 days In/Out Bed: Needs assistance Is this a change from baseline?: Change from baseline, expected to last <3 days Walks in Home: Independent with device (comment) Does the patient have difficulty walking or climbing stairs?: Yes Weakness of Legs: Both Weakness of Arms/Hands: None  Permission Sought/Granted Permission sought to share information with : Case Manager Permission granted to share information with : Yes, Verbal Permission Granted              Emotional Assessment Appearance:: Appears stated age Attitude/Demeanor/Rapport: Gracious Affect (typically observed): Pleasant Orientation: :  (spoke with patient's son) Alcohol / Substance Use: Not Applicable Psych Involvement: No (comment)  Admission diagnosis:  Transient alteration of awareness [R40.4] Acute respiratory failure with hypoxia ([name]) [J96.01] Sepsis ([place]) [A41.9] Patient Active Problem List   Diagnosis Date Noted   Severe sepsis ([place]) 03/09/2022   Intractable nausea and vomiting 01/22/2020   HTN (hypertension) 01/22/2020   Hypokalemia 01/22/2020   CKD  (chronic kidney disease), stage IV ([place]) 01/22/2020   UTI (urinary tract infection) 01/22/2020   Dehydration    BPH with obstruction/lower urinary tract symptoms 03/16/2016   Aorto-iliac disease ([place]) 08/03/2015   MGUS (monoclonal gammopathy of unknown significance) 07/31/2015   Diverticulitis of colon without hemorrhage 07/11/2015   Ileus ([place])    Perforated diverticulum of large intestine    Acute diverticulitis 06/02/2015   Atrial fibrillation, chronic ([place]) 12/06/2014   Chronic atrial fibrillation ([place]) 12/06/2014   Benign hypertension 05/19/2014   HLD (hyperlipidemia) 05/19/2014   Neuropathy 05/19/2014   Hypotension, postural 05/19/2014   PCP:  Kirk Ruths, MD Pharmacy:   [facility] - [place], [place] [address] Phone: [phone] Fax: [phone]  [place] [address]), [place] - [place])  [address] Phone: [phone] Fax: [place] [address], [place] - [place] [address] Phone: [phone] Fax: [phone]  [facility] [place], [place] [address] [place] [address] Phone: [phone] Fax: [phone]  [id number] [place] [address] [place]: [phone]: [phone]    Social Determinants of Health (SDOH) Interventions    Readmission Risk Interventions     View : No data to display.

## 2022-03-10 NOTE — Progress Notes (Signed)
PROGRESS NOTE    Jesse Macias  PXT:[id number] DOB: 26-Feb-1924 DOA: 03/09/2022 PCP: Kirk Ruths, MD    Brief Narrative:  Jesse Macias is a 88 y.o. male with medical history significant of chronic kidney disease stage III, diabetes mellitus without complication, glaucoma, hypertension, atrial fibrillation on A/C presented by EMS for coughing and MS changes. Patient has hard of hearing and is also blind, but oriented and answers appropriately.  History was given by son and the patient.  Patient's son at bedside reports for the past 2-weeks patient has been coughing.  Normally he is blind but he is able to see a little where he can go and make his coffee in the morning and go back to his seat.  Yesterday he appeared a little confused.  Today he could not do his regular morning chores and did not know where he was.  At that point family called EMS.  No fevers, shortness of breath, chest pain, abdominal pain, nausea or vomiting were reported.  Upon arrival O2 reading at 82% room air but EMS thought it just was not reading correctly.  Patient was placed on 3 L and then at 100%.     ED Course: Initial BP in the ER 52/32, was given 4 L of fluid and now sbp120.  Heart rate upon arrival 128, temperature 96.0 MAP initially 65.  Currently on 2 L satting 99%. Pertinent labs glucose 166, BUN 99, creatinine 3.15, albumin 2.9, LFTs normal ,lactic acid 2.7.  WBC 9.7, hemoglobin 12.4, hematocrit 37.3, platelets 146 (it appears to be chronic).  INR 2.4, prothrombin time 26.1. COVID-positive    Consultants:    Procedures:   Antimicrobials:  Azithromycin and ceftriaxone   Subjective: Son at bedside reports his breathing a little better.  Pt c/o achiness  Objective: Vitals:   03/10/22 1145 03/10/22 1200 03/10/22 1215 03/10/22 1348  BP: 136/84 (!) 146/89 (!) 159/98 (!) 152/91  Pulse:  70  82  Resp: (!) 28 (!) 27 (!) 28 20  Temp:    98.4 F (36.9 C)  TempSrc:      SpO2:  99%  98%  Weight:       Height:        Intake/Output Summary (Last 24 hours) at 03/10/2022 1440 Last data filed at 03/10/2022 0754 Gross per 24 hour  Intake 4750 ml  Output 400 ml  Net 4350 ml   Filed Weights   03/09/22 1900  Weight: 60 kg    Examination: Calm, NAD Rhonchi,no wheezing Reg s1/s2 no gallop Soft benign +bs No edema Awake and alert Mood and affect appropriate in current setting     Data Reviewed: I have personally reviewed following labs and imaging studies  CBC: Recent Labs  Lab 03/09/22 1348 03/10/22 0645  WBC 9.7 8.6  NEUTROABS 7.7  --   HGB 12.4* 12.2*  HCT 37.3* 36.4*  MCV 98.4 96.6  PLT 146* 627*   Basic Metabolic Panel: Recent Labs  Lab 03/09/22 1348 03/10/22 0645  NA 140 141  K 3.7 3.9  CL 107 110  CO2 25 22  GLUCOSE 166* 94  BUN 99* 79*  CREATININE 3.15* 2.23*  CALCIUM 8.9 8.8*   GFR: Estimated Creatinine Clearance: 15.6 mL/min (A) (by C-G formula based on SCr of 2.23 mg/dL (H)). Liver Function Tests: Recent Labs  Lab 03/09/22 1348  AST 37  ALT 32  ALKPHOS 53  BILITOT 1.1  PROT 5.5*  ALBUMIN 2.9*   No results for input(s): LIPASE, AMYLASE  in the last 168 hours. No results for input(s): AMMONIA in the last 168 hours. Coagulation Profile: Recent Labs  Lab 03/09/22 1348 03/10/22 0645  INR 2.4* 1.2   Cardiac Enzymes: No results for input(s): CKTOTAL, CKMB, CKMBINDEX, TROPONINI in the last 168 hours. BNP (last 3 results) No results for input(s): PROBNP in the last 8760 hours. HbA1C: Recent Labs    03/09/22 1930  HGBA1C 6.0*   CBG: Recent Labs  Lab 03/09/22 2119 03/10/22 0026 03/10/22 0545 03/10/22 0730 03/10/22 1346  GLUCAP 137* 89 77 93 160*   Lipid Profile: No results for input(s): CHOL, HDL, LDLCALC, TRIG, CHOLHDL, LDLDIRECT in the last 72 hours. Thyroid Function Tests: No results for input(s): TSH, T4TOTAL, FREET4, T3FREE, THYROIDAB in the last 72 hours. Anemia Panel: Recent Labs    03/10/22 0645  FERRITIN 372*    Sepsis Labs: Recent Labs  Lab 03/09/22 1348 03/09/22 1630 03/10/22 0645 03/10/22 0833  PROCALCITON  --  0.12 0.10  --   LATICACIDVEN 2.2*  2.7*  --   --  2.9*    Recent Results (from the past 240 hour(s))  Blood Culture (routine x 2)     Status: None (Preliminary result)   Collection Time: 03/09/22  1:48 PM   Specimen: BLOOD  Result Value Ref Range Status   Specimen Description BLOOD  Final   Special Requests   Final    BOTTLES DRAWN AEROBIC AND ANAEROBIC Blood Culture adequate volume   Culture   Final    NO GROWTH < 24 HOURS Performed at [facility], [address] [place], [place] [address]    Report Status PENDING  Incomplete  Resp Panel by RT-PCR (Flu A&B, Covid) Anterior Nasal Swab     Status: Abnormal   Collection Time: 03/09/22  2:02 PM   Specimen: Anterior Nasal Swab  Result Value Ref Range Status   SARS Coronavirus 2 by RT PCR POSITIVE (A) NEGATIVE Final    Comment: (NOTE) SARS-CoV-2 target nucleic acids are DETECTED.  The SARS-CoV-2 RNA is generally detectable in upper respiratory specimens during the acute phase of infection. Positive results are indicative of the presence of the identified virus, but do not rule out bacterial infection or co-infection with other pathogens not detected by the test. Clinical correlation with patient history and other diagnostic information is necessary to determine patient infection status. The expected result is Negative.  Fact Sheet for Patients: EntrepreneurPulse.com.au  Fact Sheet for Healthcare Providers: IncredibleEmployment.be  This test is not yet approved or cleared by the [place] FDA and  has been authorized for detection and/or diagnosis of SARS-CoV-2 by FDA under an Emergency Use Authorization (EUA).  This EUA will remain in effect (meaning this test can be used) for the duration of  the COVID-19 declaration under Section 564(b)(1) of the A ct, 21 U.S.C.  section 360bbb-3(b)(1), unless the authorization is terminated or revoked sooner.     Influenza A by PCR NEGATIVE NEGATIVE Final   Influenza B by PCR NEGATIVE NEGATIVE Final    Comment: (NOTE) The Xpert Xpress SARS-CoV-2/FLU/RSV plus assay is intended as an aid in the diagnosis of influenza from Nasopharyngeal swab specimens and should not be used as a sole basis for treatment. Nasal washings and aspirates are unacceptable for Xpert Xpress SARS-CoV-2/FLU/RSV testing.  Fact Sheet for Patients: EntrepreneurPulse.com.au  Fact Sheet for Healthcare Providers: IncredibleEmployment.be  This test is not yet approved or cleared by the [place] FDA and has been authorized for detection and/or diagnosis of  SARS-CoV-2 by FDA under an Emergency Use Authorization (EUA). This EUA will remain in effect (meaning this test can be used) for the duration of the COVID-19 declaration under Section 564(b)(1) of the Act, 21 U.S.C. section 360bbb-3(b)(1), unless the authorization is terminated or revoked.  Performed at [facility], [address], [place], [place] [address]          Radiology Studies: CT Head Wo Contrast  Result Date: 03/09/2022 CLINICAL DATA:  Altered mental status EXAM: CT HEAD WITHOUT CONTRAST TECHNIQUE: Contiguous axial images were obtained from the base of the skull through the vertex without intravenous contrast. RADIATION DOSE REDUCTION: This exam was performed according to the departmental dose-optimization program which includes automated exposure control, adjustment of the mA and/or kV according to patient size and/or use of iterative reconstruction technique. COMPARISON:  02/21/2021 FINDINGS: Brain: No evidence of acute infarction, hemorrhage, hydrocephalus, extra-axial collection or mass lesion/mass effect. Periventricular and deep white matter hypodensity. Small lacunar infarctions of the bilateral basal ganglia. Vascular:  No hyperdense vessel or unexpected calcification. Skull: Normal. Negative for fracture or focal lesion. Sinuses/Orbits: No acute finding. Other: None. IMPRESSION: No acute intracranial pathology. Small-vessel white matter disease and small nonacute lacunar infarctions. Electronically Signed   By: Delanna Ahmadi M.D.   On: 03/09/2022 15:06   DG Chest Port 1 View  Result Date: 03/09/2022 CLINICAL DATA:  Cough for the past 2 weeks. Diminished left lower lung sounds. Possible sepsis. EXAM: PORTABLE CHEST 1 VIEW COMPARISON:  06/14/2015 FINDINGS: Normal sized heart. Tortuous and partially calcified thoracic aorta. Clear lungs with normal vascularity. Mild-to-moderate bilateral AC joint degenerative changes. IMPRESSION: No active disease. Electronically Signed   By: Claudie Revering M.D.   On: 03/09/2022 14:33        Scheduled Meds:  vitamin C  500 mg Oral Daily   brimonidine  2 drop Both Eyes Daily   dexamethasone  6 mg Oral Q24H   dorzolamide  2 drop Both Eyes Daily   feeding supplement  237 mL Oral BID BM   insulin aspart  0-9 Units Subcutaneous Q4H   insulin detemir  5 Units Subcutaneous BID   mouth rinse  15 mL Mouth Rinse BID   multivitamin with minerals  1 tablet Oral Daily   zinc sulfate  220 mg Oral Daily   Continuous Infusions:  azithromycin     cefTRIAXone (ROCEPHIN)  IV 2 g (03/10/22 1428)   remdesivir 100 mg in NS 100 mL Stopped (03/10/22 1040)    Assessment & Plan:   Principal Problem:   Severe sepsis ([place]) Active Problems:   Atrial fibrillation, chronic ([name])   BPH with obstruction/lower urinary tract symptoms   HTN (hypertension)   CKD (chronic kidney disease), stage IV ([name])   Severe Sepsis ([facility]) Patient presented with sepsis criteria including hypothermia, tachycardia, signs of endorgan damage/AKI, hypotension and lactic acidosis. Received 4 L IV fluid in ER with BP improving not needing pressors We will check procalcitonin He had taken his blood pressure medications  likely adding to his hypotension Sepsis likely due to covid pneumonia.  5/29 bp improved Bcx and ucx pending Continue iv abx empirically       2. Covid infection Covid test + With cough. Ck procalcitonin, if negative can dc iv abx Start covid treatment protocal Will add steroids since was hypoxic on arrival and requiring [place]. Vitamin C and Zn.  5/29 trend d dimer and ferritin Add mucinex   3.  Chronic atrial fibrillation With RVR, improving slowly  with given of IVF If need to will give digoxin to avoid reducing bp 5/ 29 will resume coreg since bp stable Resume    4. Essential hypertension Elevated, resume Coreg   5. AKI on CKD stage IV Likely due to sepsis/prerenal/hypotension Continue IV fluids Was given IV fluid resuscitation in ED Baseline creatinine 3 months ago was 2.0, currently 3.15 Check renal function in a.m. May have some ATN  Hold all nephrotoxic meds 5/29 has improved  6. Elevated TP Likely demand ischemia Will ck echo   6. BPH On tamsulosin, hold for now due to hypotension    7.  Diabetes mellitus type 2 with hyperglycemia Not sure what patient takes at home, will ck with pharmacy Place on RISS Ck FS Check A1c   DVT prophylaxis: xarelto Code Status:DNR Family Communication: son at bedside Disposition Plan:  Status is: Inpatient Remains inpatient appropriate because: IV treatment.  Medically not ready        LOS: 1 day   Time spent: 35 min    Nolberto Hanlon, MD [facility] Pager [phone]  If 7PM-7AM, please contact night-coverage 03/10/2022, 2:40 PM

## 2022-03-10 NOTE — ED Notes (Signed)
Pt provided warm blanket.

## 2022-03-10 NOTE — ED Notes (Signed)
Pt coughing after drinking liquids and after medication administration. Son at bedside states "he just got strangled with drinking coffee too fast". Dr Kurtis Bushman notified for swallow eval to be ordered

## 2022-03-10 NOTE — ED Notes (Signed)
Pt eating breakfast 

## 2022-03-10 NOTE — ED Notes (Signed)
Brandon RN aware of assigned bed °

## 2022-03-10 NOTE — Evaluation (Signed)
Clinical/Bedside Swallow Evaluation Patient Details  Name: Jesse Macias MRN: [id number] Date of Birth: 1924-02-28  Today's Date: 03/10/2022 Time: SLP Start Time (ACUTE ONLY): 1200 SLP Stop Time (ACUTE ONLY): 0973 SLP Time Calculation (min) (ACUTE ONLY): 20 min  Past Medical History:  Past Medical History:  Diagnosis Date   Anemia    anemia of chronic renal disease   Chronic kidney disease    acute renal failure, chronic kidney disease stage III   Diabetes mellitus without complication ([place])    Diverticulitis    Glaucoma    History of hiatal hernia    Hyperlipemia    Hypertension    Neuromuscular disorder ([place])    peripheral neuropathy   Past Surgical History:  Past Surgical History:  Procedure Laterality Date   BACK SURGERY     CENTRAL VENOUS CATHETER INSERTION N/A 06/09/2015   Procedure: INSERTION CENTRAL LINE ADULT;  Surgeon: Florene Glen, MD;  Location: [facility] ORS;  Service: General;  Laterality: N/A;   COLECTOMY WITH COLOSTOMY CREATION/HARTMANN PROCEDURE N/A 06/09/2015   Procedure: COLECTOMY WITH COLOSTOMY CREATION/HARTMANN PROCEDURE;  Surgeon: Florene Glen, MD;  Location: [facility] ORS;  Service: General;  Laterality: N/A;   HERNIA REPAIR     Two   HPI:  Per H&P "Jesse Macias is a 88 y.o. male with medical history significant of chronic kidney disease stage III, diabetes mellitus without complication, glaucoma, hypertension, atrial fibrillation on A/C presented by EMS for coughing and MS changes. Patient has hard of hearing and is also blind, but oriented and answers appropriately.  History was given by son and the patient.  Patient's son at bedside reports for the past 2-weeks patient has been coughing.  Normally he is blind but he is able to see a little where he can go and make his coffee in the morning and go back to his seat.  Yesterday he appeared a little confused.  Today he could not do his regular morning chores and did not know where he was.  At that point family  called EMS.  No fevers, shortness of breath, chest pain, abdominal pain, nausea or vomiting were reported.  Upon arrival O2 reading at 82% room air but EMS thought it just was not reading correctly.  Patient was placed on 3 L and then at 100%." CXR on admission "no active disease." Head CT on admission "No acute intracranial pathology. Small-vessel white matter disease  and small nonacute lacunar infarctions.:"    Assessment / Plan / Recommendation  Clinical Impression  Pt seen for clinical swallowing evaluation. Pt alert. HOH and legally blind. On 2L/min O2 via [place]. Baseline congested cough noted which son noted has been going on since pt has had COVID-19. Son at bedside. Son denies pt with difficulty swallowing PTA.   Oral motor examination completed and significant for natural upper dentition, lower denture plate (not donned).   Per chart review, temp and WBC WNL. CXR "no active disease." Pt seen for clinical swallowing evaluation in April 2021 with recommendation for a mech soft diet with thin liquids (see EMR for additional details).   Pt with limited cooperation with PO trials despite education and encouragement. Pt declined solid and pureed trials. Per son, pt with mildly prolonged mastication with solids this AM. Son felt this was secondary due dental status. Pt with no overt or subtle s/sx pharyngeal dysphagia with trials of water via cup or straw sip. No change to vocal quality or respiratory status appreciated.   Based on SLP observation, son's  report, and pt's previous swallowing evaluation, recommend a Mech Soft (Dysphagia 3) Diet with Thin Liquids and safe swallowing strategies/aspiration precautions as outlined below.   SLP to f/u per POC for diet tolerance.   Pt, pt's son, and RN made aware of results, recommendations, and POC. Son verbalized understanding/agreement. Likely reduced understanding by pt.   SLP Visit Diagnosis: Dysphagia, oropharyngeal phase (R13.12)    Aspiration  Risk  Mild aspiration risk    Diet Recommendation Dysphagia 3 (Mech soft);Thin liquid   Medication Administration: Crushed with puree Supervision: Staff to assist with self feeding;Full supervision/cueing for compensatory strategies Compensations: Minimize environmental distractions;Slow rate;Small sips/bites Postural Changes: Seated upright at 90 degrees;Remain upright for at least 30 minutes after po intake    Other  Recommendations Oral Care Recommendations: Oral care BID;Staff/trained caregiver to provide oral care    Recommendations for follow up therapy are one component of a multi-disciplinary discharge planning process, led by the attending physician.  Recommendations may be updated based on patient status, additional functional criteria and insurance authorization.  Follow up Recommendations No SLP follow up (likely no f/u SLP services warranted)      Assistance Recommended at Discharge  (likely need for some level of assistance given AMS)  Functional Status Assessment Patient has had a recent decline in their functional status and demonstrates the ability to make significant improvements in function in a reasonable and predictable amount of time.  Frequency and Duration min 2x/week  2 weeks       Prognosis Prognosis for Safe Diet Advancement: Fair Barriers to Reach Goals: Behavior      Swallow Study   General Date of Onset: 03/09/22 HPI: Per H&P "Jesse Macias is a 88 y.o. male with medical history significant of chronic kidney disease stage III, diabetes mellitus without complication, glaucoma, hypertension, atrial fibrillation on A/C presented by EMS for coughing and MS changes. Patient has hard of hearing and is also blind, but oriented and answers appropriately.  History was given by son and the patient.  Patient's son at bedside reports for the past 2-weeks patient has been coughing.  Normally he is blind but he is able to see a little where he can go and make his  coffee in the morning and go back to his seat.  Yesterday he appeared a little confused.  Today he could not do his regular morning chores and did not know where he was.  At that point family called EMS.  No fevers, shortness of breath, chest pain, abdominal pain, nausea or vomiting were reported.  Upon arrival O2 reading at 82% room air but EMS thought it just was not reading correctly.  Patient was placed on 3 L and then at 100%." CXR on admission "no active disease." Head CT on admission "No acute intracranial pathology. Small-vessel white matter disease  and small nonacute lacunar infarctions.:" Type of Study: Bedside Swallow Evaluation Previous Swallow Assessment: Clinical Swallowing Evaluation, 01/25/20, recommended mech soft diet with thin liquids - see note for details Diet Prior to this Study: NPO Respiratory Status: Nasal cannula (2L/min) Behavior/Cognition: Alert;Uncooperative Oral Cavity Assessment: Within Functional Limits Oral Care Completed by SLP: Recent completion by staff Oral Cavity - Dentition:  (natural upper dentition; has lower dentures - not donned for evaluation) Vision:  (visual deficits; needs assistance with feeding at present given visual deficits and AMS) Self-Feeding Abilities: Needs assist;Total assist Patient Positioning: Upright in bed Baseline Vocal Quality: Normal Volitional Cough:  (congested baseline cough) Volitional Swallow: Unable to elicit  Oral/Motor/Sensory Function Overall Oral Motor/Sensory Function: Within functional limits   Ice Chips Ice chips: Not tested   Thin Liquid Thin Liquid: Within functional limits Presentation: Cup;Straw    Nectar Thick Nectar Thick Liquid: Not tested   Honey Thick Honey Thick Liquid: Not tested   Puree Puree: Not tested Other Comments: pt refused despite encouragement; son noted pt without difficulty with pureed texture from breakfast tray   Solid     Solid: Not tested Other Comments: pt refused despite  encouragement; per son, pt with mildly prolonged mastication with solids this AM which son felt was due to dental status     Cherrie Gauze, M.S., [facility] [phone] (ASCOM)  Jesse Macias 03/10/2022,1:22 PM

## 2022-03-11 ENCOUNTER — Inpatient Hospital Stay (HOSPITAL_COMMUNITY)
Admit: 2022-03-11 | Discharge: 2022-03-11 | Disposition: A | Discharge status: Home / Self Care | Payer: Medicare Other | Attending: Internal Medicine | Admitting: Internal Medicine

## 2022-03-11 DIAGNOSIS — R6520 Severe sepsis without septic shock: Secondary | ICD-10-CM | POA: Diagnosis not present

## 2022-03-11 DIAGNOSIS — A419 Sepsis, unspecified organism: Secondary | ICD-10-CM | POA: Diagnosis not present

## 2022-03-11 DIAGNOSIS — I482 Chronic atrial fibrillation, unspecified: Secondary | ICD-10-CM

## 2022-03-11 DIAGNOSIS — R778 Other specified abnormalities of plasma proteins: Secondary | ICD-10-CM

## 2022-03-11 LAB — ECHOCARDIOGRAM COMPLETE
Area-P 1/2: 4.04 cm2
Height: 62.992 in
S' Lateral: 2.9 cm
Weight: 2116.42 oz

## 2022-03-11 LAB — MAGNESIUM: Magnesium: 1.9 mg/dL (ref 1.7–2.4)

## 2022-03-11 LAB — BASIC METABOLIC PANEL
Anion gap: 10 (ref 5–15)
BUN: 79 mg/dL — ABNORMAL HIGH (ref 8–23)
CO2: 22 mmol/L (ref 22–32)
Calcium: 8.9 mg/dL (ref 8.9–10.3)
Chloride: 112 mmol/L — ABNORMAL HIGH (ref 98–111)
Creatinine, Ser: 2.01 mg/dL — ABNORMAL HIGH (ref 0.61–1.24)
GFR, Estimated: 30 mL/min — ABNORMAL LOW (ref >60)
Glucose, Bld: 97 mg/dL (ref 70–99)
Potassium: 3.7 mmol/L (ref 3.5–5.1)
Sodium: 144 mmol/L (ref 135–145)

## 2022-03-11 LAB — URINE CULTURE

## 2022-03-11 LAB — GLUCOSE, CAPILLARY
Glucose-Capillary: 91 mg/dL (ref 70–99)
Glucose-Capillary: 98 mg/dL (ref 70–99)
Glucose-Capillary: 105 mg/dL — ABNORMAL HIGH (ref 70–99)
Glucose-Capillary: 127 mg/dL — ABNORMAL HIGH (ref 70–99)
Glucose-Capillary: 149 mg/dL — ABNORMAL HIGH (ref 70–99)
Glucose-Capillary: 162 mg/dL — ABNORMAL HIGH (ref 70–99)

## 2022-03-11 LAB — PLATELET COUNT: Platelets: 185 10*3/uL (ref 150–400)

## 2022-03-11 LAB — FERRITIN: Ferritin: 422 ng/mL — ABNORMAL HIGH (ref 24–336)

## 2022-03-11 LAB — D-DIMER, QUANTITATIVE: D-Dimer, Quant: 0.9 ug/mL-FEU — ABNORMAL HIGH (ref 0.00–0.50)

## 2022-03-11 LAB — LACTIC ACID, PLASMA: Lactic Acid, Venous: 1.1 mmol/L (ref 0.5–1.9)

## 2022-03-11 MED ORDER — CARVEDILOL 6.25 MG PO TABS
12.5000 mg | ORAL_TABLET | Freq: Two times a day (BID) | ORAL | Status: DC
  Administered 2022-03-11 – 2022-03-13 (×6): 12.5 mg via ORAL
  Filled 2022-03-11 (×7): qty 2

## 2022-03-11 MED ORDER — TIMOLOL MALEATE 0.5 % OP SOLN
1.0000 [drp] | Freq: Two times a day (BID) | OPHTHALMIC | Status: DC
  Administered 2022-03-11 – 2022-03-13 (×5): 1 [drp] via OPHTHALMIC
  Filled 2022-03-11: qty 5

## 2022-03-11 NOTE — Progress Notes (Signed)
PROGRESS NOTE    Jesse Macias  STM:[id number] DOB: May 26, 1924 DOA: 03/09/2022 PCP: Kirk Ruths, MD    Brief Narrative:  Jesse Macias is a 88 y.o. male with medical history significant of chronic kidney disease stage III, diabetes mellitus without complication, glaucoma, hypertension, atrial fibrillation on A/C presented by EMS for coughing and MS changes. Positive covid. Started for treatment. MS improved in ER.   5/30 decreased p.o. intake per son at bedside.  Shortness of breath is better has less coughing now.  No chest pain.    Consultants:    Procedures:   Antimicrobials:  Azithromycin and ceftriaxone   Subjective: No new complaints  Objective: Vitals:   03/10/22 1617 03/10/22 1920 03/11/22 0114 03/11/22 0615  BP: (!) 146/100 118/72 (!) 165/109 (!) 150/81  Pulse: 94 79 99 79  Resp: '20 20 20 20  '$ Temp:   97.7 F (36.5 C)   TempSrc:      SpO2: 97%  99% 100%  Weight:      Height:        Intake/Output Summary (Last 24 hours) at 03/11/2022 0809 Last data filed at 03/11/2022 0600 Gross per 24 hour  Intake 500 ml  Output 950 ml  Net -450 ml   Filed Weights   03/09/22 1900  Weight: 60 kg    Examination: Lying in bed sideways, nad Decrease rhonchi, no wheezing Reg s1/s2 no gallop Soft benign +ostomy with green liquidy stool No edema Mood and affect appropriate in current setting     Data Reviewed: I have personally reviewed following labs and imaging studies  CBC: Recent Labs  Lab 03/09/22 1348 03/10/22 0645  WBC 9.7 8.6  NEUTROABS 7.7  --   HGB 12.4* 12.2*  HCT 37.3* 36.4*  MCV 98.4 96.6  PLT 146* 892*   Basic Metabolic Panel: Recent Labs  Lab 03/09/22 1348 03/10/22 0645 03/11/22 0543  NA 140 141 144  K 3.7 3.9 3.7  CL 107 110 112*  CO2 '25 22 22  '$ GLUCOSE 166* 94 97  BUN 99* 79* 79*  CREATININE 3.15* 2.23* 2.01*  CALCIUM 8.9 8.8* 8.9  MG  --   --  1.9   GFR: Estimated Creatinine Clearance: 17.3 mL/min (A) (by C-G formula  based on SCr of 2.01 mg/dL (H)). Liver Function Tests: Recent Labs  Lab 03/09/22 1348  AST 37  ALT 32  ALKPHOS 53  BILITOT 1.1  PROT 5.5*  ALBUMIN 2.9*   No results for input(s): LIPASE, AMYLASE in the last 168 hours. No results for input(s): AMMONIA in the last 168 hours. Coagulation Profile: Recent Labs  Lab 03/09/22 1348 03/10/22 0645  INR 2.4* 1.2   Cardiac Enzymes: No results for input(s): CKTOTAL, CKMB, CKMBINDEX, TROPONINI in the last 168 hours. BNP (last 3 results) No results for input(s): PROBNP in the last 8760 hours. HbA1C: Recent Labs    03/09/22 1930  HGBA1C 6.0*   CBG: Recent Labs  Lab 03/10/22 1826 03/10/22 1954 03/11/22 0112 03/11/22 0614 03/11/22 0744  GLUCAP 165* 152* 91 98 105*   Lipid Profile: No results for input(s): CHOL, HDL, LDLCALC, TRIG, CHOLHDL, LDLDIRECT in the last 72 hours. Thyroid Function Tests: No results for input(s): TSH, T4TOTAL, FREET4, T3FREE, THYROIDAB in the last 72 hours. Anemia Panel: Recent Labs    03/10/22 0645 03/11/22 0543  FERRITIN 372* 422*   Sepsis Labs: Recent Labs  Lab 03/09/22 1348 03/09/22 1630 03/10/22 0645 03/10/22 0833  PROCALCITON  --  0.12 0.10  --  LATICACIDVEN 2.2*  2.7*  --   --  2.9*    Recent Results (from the past 240 hour(s))  Blood Culture (routine x 2)     Status: None (Preliminary result)   Collection Time: 03/09/22  1:48 PM   Specimen: BLOOD  Result Value Ref Range Status   Specimen Description BLOOD  Final   Special Requests   Final    BOTTLES DRAWN AEROBIC AND ANAEROBIC Blood Culture adequate volume   Culture   Final    NO GROWTH 2 DAYS Performed at [facility], [address], [place], [place] [address]    Report Status PENDING  Incomplete  Resp Panel by RT-PCR (Flu A&B, Covid) Anterior Nasal Swab     Status: Abnormal   Collection Time: 03/09/22  2:02 PM   Specimen: Anterior Nasal Swab  Result Value Ref Range Status   SARS Coronavirus 2 by RT PCR  POSITIVE (A) NEGATIVE Final    Comment: (NOTE) SARS-CoV-2 target nucleic acids are DETECTED.  The SARS-CoV-2 RNA is generally detectable in upper respiratory specimens during the acute phase of infection. Positive results are indicative of the presence of the identified virus, but do not rule out bacterial infection or co-infection with other pathogens not detected by the test. Clinical correlation with patient history and other diagnostic information is necessary to determine patient infection status. The expected result is Negative.  Fact Sheet for Patients: EntrepreneurPulse.com.au  Fact Sheet for Healthcare Providers: IncredibleEmployment.be  This test is not yet approved or cleared by the [place] FDA and  has been authorized for detection and/or diagnosis of SARS-CoV-2 by FDA under an Emergency Use Authorization (EUA).  This EUA will remain in effect (meaning this test can be used) for the duration of  the COVID-19 declaration under Section 564(b)(1) of the A ct, 21 U.S.C. section 360bbb-3(b)(1), unless the authorization is terminated or revoked sooner.     Influenza A by PCR NEGATIVE NEGATIVE Final   Influenza B by PCR NEGATIVE NEGATIVE Final    Comment: (NOTE) The Xpert Xpress SARS-CoV-2/FLU/RSV plus assay is intended as an aid in the diagnosis of influenza from Nasopharyngeal swab specimens and should not be used as a sole basis for treatment. Nasal washings and aspirates are unacceptable for Xpert Xpress SARS-CoV-2/FLU/RSV testing.  Fact Sheet for Patients: EntrepreneurPulse.com.au  Fact Sheet for Healthcare Providers: IncredibleEmployment.be  This test is not yet approved or cleared by the [place] FDA and has been authorized for detection and/or diagnosis of SARS-CoV-2 by FDA under an Emergency Use Authorization (EUA). This EUA will remain in effect (meaning this test can be used)  for the duration of the COVID-19 declaration under Section 564(b)(1) of the Act, 21 U.S.C. section 360bbb-3(b)(1), unless the authorization is terminated or revoked.  Performed at [facility], [address], [place], [place] [address]   Urine Culture     Status: Abnormal (Preliminary result)   Collection Time: 03/09/22  5:26 PM   Specimen: In/Out Cath Urine  Result Value Ref Range Status   Specimen Description   Final    IN/OUT CATH URINE Performed at [facility], [address], [place], [place] [address]    Special Requests   Final    NONE Performed at [facility], [address] [place], [place] [address]    Culture (A)  Final    60,000 COLONIES/mL DIPHTHEROIDS(CORYNEBACTERIUM SPECIES) Standardized susceptibility testing for this organism is not available. CULTURE REINCUBATED FOR BETTER GROWTH Performed at [facility], [place] [address] [place], [place] [address]    Report Status PENDING  Incomplete  Culture, blood (Routine X 2) w Reflex to ID Panel     Status: None (Preliminary result)   Collection Time: 03/10/22  3:37 PM   Specimen: BLOOD  Result Value Ref Range Status   Specimen Description BLOOD BLOOD RIGHT HAND  Final   Special Requests   Final    BOTTLES DRAWN AEROBIC AND ANAEROBIC Blood Culture adequate volume   Culture   Final    NO GROWTH < 24 HOURS Performed at [facility], [address], [place], [place] [address]    Report Status PENDING  Incomplete         Radiology Studies: CT Head Wo Contrast  Result Date: 03/09/2022 CLINICAL DATA:  Altered mental status EXAM: CT HEAD WITHOUT CONTRAST TECHNIQUE: Contiguous axial images were obtained from the base of the skull through the vertex without intravenous contrast. RADIATION DOSE REDUCTION: This exam was performed according to the departmental dose-optimization program which includes automated exposure control, adjustment of the mA and/or kV according to  patient size and/or use of iterative reconstruction technique. COMPARISON:  02/21/2021 FINDINGS: Brain: No evidence of acute infarction, hemorrhage, hydrocephalus, extra-axial collection or mass lesion/mass effect. Periventricular and deep white matter hypodensity. Small lacunar infarctions of the bilateral basal ganglia. Vascular: No hyperdense vessel or unexpected calcification. Skull: Normal. Negative for fracture or focal lesion. Sinuses/Orbits: No acute finding. Other: None. IMPRESSION: No acute intracranial pathology. Small-vessel white matter disease and small nonacute lacunar infarctions. Electronically Signed   By: Delanna Ahmadi M.D.   On: 03/09/2022 15:06   DG Chest Port 1 View  Result Date: 03/09/2022 CLINICAL DATA:  Cough for the past 2 weeks. Diminished left lower lung sounds. Possible sepsis. EXAM: PORTABLE CHEST 1 VIEW COMPARISON:  06/14/2015 FINDINGS: Normal sized heart. Tortuous and partially calcified thoracic aorta. Clear lungs with normal vascularity. Mild-to-moderate bilateral AC joint degenerative changes. IMPRESSION: No active disease. Electronically Signed   By: Claudie Revering M.D.   On: 03/09/2022 14:33        Scheduled Meds:  vitamin C  500 mg Oral Daily   brimonidine  2 drop Both Eyes Daily   carvedilol  6.25 mg Oral BID WC   dexamethasone  6 mg Oral Q24H   dorzolamide  2 drop Both Eyes Daily   feeding supplement  237 mL Oral BID BM   guaiFENesin  600 mg Oral BID   insulin aspart  0-9 Units Subcutaneous Q4H   insulin detemir  5 Units Subcutaneous BID   mouth rinse  15 mL Mouth Rinse BID   multivitamin with minerals  1 tablet Oral Daily   rivaroxaban  15 mg Oral Q supper   zinc sulfate  220 mg Oral Daily   Continuous Infusions:  azithromycin Stopped (03/10/22 1609)   cefTRIAXone (ROCEPHIN)  IV Stopped (03/10/22 1458)   remdesivir 100 mg in NS 100 mL Stopped (03/10/22 1040)    Assessment & Plan:   Principal Problem:   Severe sepsis ([place]) Active Problems:    Atrial fibrillation, chronic ([name])   BPH with obstruction/lower urinary tract symptoms   HTN (hypertension)   CKD (chronic kidney disease), stage IV ([name])   Severe Sepsis (HC) Patient presented with sepsis criteria including hypothermia, tachycardia, signs of endorgan damage/AKI, hypotension and lactic acidosis. Received 4 L IV fluid in ER with BP improving not needing pressors We will check procalcitonin He had taken his blood pressure medications  likely adding to his hypotension Sepsis likely due to covid infection 5/30 clinically improving. Urine culture with multiple organisms Lactic acid normal now Blood culture to date negative Treating empirically with IV antibiotics we will continue        2. Covid infection Covid test + Added steroids since was hypoxic on arrival and requiring [place]. Vitamin C and Zn.  5/30 D dimer trending down. Ferritin pending.  On IV remdesivir and continue steroids Wean off oxygen as tolerated keeping O2 sat more than 92%    3.  Chronic atrial fibrillation With RVR but improved with IV fluids initially. Blood pressure has room we will increase his Coreg to 12.5 mg twice daily Continue Xarelto    4. Essential hypertension Has room for improvement Will increase Coreg to 12.5 mg twice daily   5. AKI on CKD stage IV Likely due to sepsis/prerenal/hypotension Was given IV fluid resuscitation in ED Baseline creatinine 3 months ago was 2.0,  5/30 renal function improved close to baseline IV fluid was discontinued to prevent overload specially in setting of COVID infection  6. Elevated TP Likely demand ischemia Echo pending   6. BPH On tamsulosin, hold for now due to hypotension  Resume when BP has more room after given higher dose of Coreg   7.  Diabetes mellitus type 2 with hyperglycemia Not sure what patient takes at home, will ck with pharmacy Hemoglobin A1c 6.0 Decreased p.o. intake encouraged drinking protein shakes BG normal to  low We will DC standing dose insulin until p.o. intake improves Continue R-ISS  Consult PT/OT  DVT prophylaxis: xarelto Code Status:DNR Family Communication: son at bedside Disposition Plan:  Status is: Inpatient Remains inpatient appropriate because: IV treatment.  Medically not ready, PT./OT        LOS: 2 days   Time spent: 35 min    Nolberto Hanlon, MD [facility] Pager [phone]  If 7PM-7AM, please contact night-coverage 03/11/2022, 8:09 AM

## 2022-03-11 NOTE — Progress Notes (Signed)
Initial Nutrition Assessment  DOCUMENTATION CODES:   Not applicable  INTERVENTION:  - Ensure Enlive po TID, each supplement provides 350 kcal and 20 grams of protein.  - MVI with minerals daily  NUTRITION DIAGNOSIS:   Inadequate oral intake related to lethargy/confusion, decreased appetite as evidenced by per patient/family report.  GOAL:   Patient will meet greater than or equal to 90% of their needs  MONITOR:   PO intake, Supplement acceptance, Diet advancement, Labs  REASON FOR ASSESSMENT:   Malnutrition Screening Tool    ASSESSMENT:   Pt admitted with confusion and coughing, found to have severe sepsis and COVID+. PMH significant for CKD stage III, J8HU without complication, glaucoma, HTN and afib on A/C.  Spoke with pt's son present at bedside. He reports that pt does not eat much as baseline. He usually will go to the kitchen to grab a Boost or a honey bun. He sometimes has a meal that the caretaker will prepare but requires constant encouragement.   During admission pt is not eating meals that his son is aware of. He states that he has been fidgety and unable to get comfortable but since receiving a dose of tylenol has been able to sleep well. Suspects pt has "brain fog" d/t COVID as he has been more confused than usual. Pt has multiple Ensure and 1 Boost on side table of which he has taken a couple sips of.   No documented meal completions.  Pt's son is unsure of recent wt loss as they do not typically weigh him. He does not suspect he has lost wt recently and believes his clothes do not look more loose than usual. There is limited documentation of wt hx within the last year. Will continue to monitor throughout admission. Current wt noted to be 60 kg.   Unable to perform Nutrition Focused Physical Exam as pt was resting, however pt appears to have muscle and fat wasting. Suspect a degree of malnutrition   Medications: vitamin C, decadron, SSI 0-9 units Q4H, MVI,  zinc, IV abx, remdesivir   Labs: BUN 79, Cr 2.01, GFR 30, HgbA1c 6.0%, CBG's 91-160 x24 hours  NUTRITION - FOCUSED PHYSICAL EXAM: Pt resting. Family preferred to defer to follow up.   Diet Order:   Diet Order             DIET DYS 3 Room service appropriate? Yes; Fluid consistency: Thin  Diet effective now                   EDUCATION NEEDS:   No education needs have been identified at this time  Skin:  Skin Assessment: Skin Integrity Issues: Skin Integrity Issues:: Stage I Stage I: coccyx  Last BM:  5/29 (via ostomy)  Height:   Ht Readings from Last 1 Encounters:  03/09/22 5' 2.99" (1.6 m)    Weight:   Wt Readings from Last 1 Encounters:  03/09/22 60 kg   BMI:  Body mass index is 23.44 kg/m.  Estimated Nutritional Needs:   Kcal:  1450-1650  Protein:  75-90g  Fluid:  >/=1.5L  Clayborne Dana, RDN, LDN Clinical Nutrition

## 2022-03-11 NOTE — Progress Notes (Signed)
OT Cancellation Note  Patient Details Name: Jesse Macias MRN: [id number] DOB: 1924/03/20   Cancelled Treatment:    Reason Eval/Treat Not Completed: Fatigue/lethargy limiting ability to participate. Pt supine in bed and sleeping soundly with pt's son present in the room asking therapist to allow pt to rest and return in the morning. OT will re-attempt tomorrow per family wishes.   Darleen Crocker, MS, OTR/L , CBIS ascom [phone]  03/11/22, 4:18 PM

## 2022-03-11 NOTE — Evaluation (Signed)
Physical Therapy Evaluation Patient Details Name: Jesse Macias MRN: [id number] DOB: May 15, 1924 Today's Date: 03/11/2022  History of Present Illness  88 y.o. male with medical history significant for chronic kidney disease stage III, diabetes mellitus, glaucoma, colostomy, hypertension, atrial fibrillation on A/C presented by EMS for coughing and mental status changes. covid+  Clinical Impression  Pt initially not wanting to do a lot, but PT and son were able to convince him to participate relatively easily.  He needed close guidance due to blindness and being in a novel setting, but he did not need physical assist with bed mobility or getting to standing.  He ultimately showed good ability to do some limited in room ambulation (with and w/o AD) and son reports that his shuffling/guarded gait is near baseline in unfamiliar environments.  Pt's O2 remained in the high 90s t/o the effort on room air, recommending HHPT when medically ready for d/c.     Recommendations for follow up therapy are one component of a multi-disciplinary discharge planning process, led by the attending physician.  Recommendations may be updated based on patient status, additional functional criteria and insurance authorization.  Follow Up Recommendations Home health PT    Assistance Recommended at Discharge Set up Supervision/Assistance  Patient can return home with the following  Assistance with cooking/housework;Direct supervision/assist for medications management;Assist for transportation;A little help with bathing/dressing/bathroom;A little help with walking and/or transfers    Equipment Recommendations None recommended by PT  Recommendations for Other Services       Functional Status Assessment Patient has had a recent decline in their functional status and demonstrates the ability to make significant improvements in function in a reasonable and predictable amount of time.     Precautions / Restrictions  Precautions Precautions: Fall Restrictions Weight Bearing Restrictions: No      Mobility  Bed Mobility Overal bed mobility: Modified Independent             General bed mobility comments: Pt needed some cuing to orient to room/bed, but was able to attain sitting at EOB w/o phyiscal assist    Transfers Overall transfer level: Modified independent Equipment used: Rolling walker (2 wheels), None, 1 person hand held assist               General transfer comment: Pt was able to rise to standing multiple times during session, no assist needed apart from set up and tactile cues for AD, etc awareness    Ambulation/Gait Ambulation/Gait assistance: Supervision Gait Distance (Feet): 30 Feet Assistive device: Rolling walker (2 wheels), None         General Gait Details: 30 ft X 2 with brief seated rest break.  Pt's O2 remained in the high 90s with activity, some subjective fatigue that is not excessive.  Pt did have very choppy/guarded cadence w/o AD but son reports this is near baseline when he is walking in an Scientific laboratory technician    Modified Rankin (Stroke Patients Only)       Balance Overall balance assessment: Needs assistance   Sitting balance-Leahy Scale: Good     Standing balance support: No upper extremity supported Standing balance-Leahy Scale: Fair Standing balance comment: Pt able to maintain static standing w/o UE support, but with any dynamic standing activity his vision impairment limits his confidence and he did have some minimal unsteadiness with no LOBs  Pertinent Vitals/Pain Pain Assessment Pain Assessment: No/denies pain    Home Living Family/patient expects to be discharged to:: Private residence Living Arrangements: Spouse/significant other Available Help at Discharge: Available 24 hours/day (4 children + some paid help provide near 24/7 assist)   Home  Access: Stairs to enter Entrance Stairs-Rails:  (yes) Entrance Stairs-Number of Steps: 3   Home Layout: One level Home Equipment: Conservation officer, nature (2 wheels);Rollator (4 wheels);Cane - single point      Prior Function Prior Level of Function : Needs assist (vision and hearing necessitate some assist but apparently he is relatively independent in the home most of the time)                     Hand Dominance        Extremity/Trunk Assessment   Upper Extremity Assessment Upper Extremity Assessment: Generalized weakness;Overall [facility] for tasks assessed    Lower Extremity Assessment Lower Extremity Assessment: Generalized weakness;Overall WFL for tasks assessed       Communication   Communication: HOH  Cognition Arousal/Alertness: Awake/alert Behavior During Therapy: WFL for tasks assessed/performed Overall Cognitive Status: Within Functional Limits for tasks assessed                                          General Comments      Exercises     Assessment/Plan    PT Assessment Patient needs continued PT services  PT Problem List Decreased strength;Decreased range of motion;Decreased activity tolerance;Decreased balance;Decreased mobility;Decreased safety awareness;Decreased knowledge of use of DME       PT Treatment Interventions DME instruction;Gait training;Stair training;Functional mobility training;Balance training;Therapeutic exercise    PT Goals (Current goals can be found in the Care Plan section)  Acute Rehab PT Goals Patient Stated Goal: go hme PT Goal Formulation: With patient/family Time For Goal Achievement: 03/25/22 Potential to Achieve Goals: Good    Frequency Min 2X/week     Co-evaluation               AM-PAC PT "6 Clicks" Mobility  Outcome Measure Help needed turning from your back to your side while in a flat bed without using bedrails?: None Help needed moving from lying on your back to sitting on the side of a  flat bed without using bedrails?: None Help needed moving to and from a bed to a chair (including a wheelchair)?: A Little Help needed standing up from a chair using your arms (e.g., wheelchair or bedside chair)?: A Little Help needed to walk in hospital room?: A Little Help needed climbing 3-5 steps with a railing? : A Little 6 Click Score: 20    End of Session Equipment Utilized During Treatment: Gait belt Activity Tolerance: Patient tolerated treatment well;Patient limited by fatigue Patient left: with chair alarm set;with call bell/phone within reach;with family/visitor present Nurse Communication: Mobility status PT Visit Diagnosis: Muscle weakness (generalized) (M62.81);Difficulty in walking, not elsewhere classified (R26.2);Unsteadiness on feet (R26.81)    Time: 8938-1017 PT Time Calculation (min) (ACUTE ONLY): 42 min   Charges:   PT Evaluation $PT Eval Low Complexity: 1 Low PT Treatments $Gait Training: 8-22 mins        Kreg Shropshire, DPT 03/11/2022, 3:41 PM

## 2022-03-12 DIAGNOSIS — U071 COVID-19: Secondary | ICD-10-CM | POA: Diagnosis present

## 2022-03-12 DIAGNOSIS — L8990 Pressure ulcer of unspecified site, unspecified stage: Secondary | ICD-10-CM | POA: Diagnosis present

## 2022-03-12 DIAGNOSIS — A419 Sepsis, unspecified organism: Secondary | ICD-10-CM | POA: Diagnosis not present

## 2022-03-12 DIAGNOSIS — R6520 Severe sepsis without septic shock: Secondary | ICD-10-CM | POA: Diagnosis not present

## 2022-03-12 DIAGNOSIS — N179 Acute kidney failure, unspecified: Secondary | ICD-10-CM

## 2022-03-12 HISTORY — DX: Acute kidney failure, unspecified: N17.9

## 2022-03-12 LAB — GLUCOSE, CAPILLARY
Glucose-Capillary: 124 mg/dL — ABNORMAL HIGH (ref 70–99)
Glucose-Capillary: 145 mg/dL — ABNORMAL HIGH (ref 70–99)
Glucose-Capillary: 150 mg/dL — ABNORMAL HIGH (ref 70–99)
Glucose-Capillary: 152 mg/dL — ABNORMAL HIGH (ref 70–99)
Glucose-Capillary: 187 mg/dL — ABNORMAL HIGH (ref 70–99)
Glucose-Capillary: 219 mg/dL — ABNORMAL HIGH (ref 70–99)

## 2022-03-12 LAB — D-DIMER, QUANTITATIVE: D-Dimer, Quant: 1.27 ug/mL-FEU — ABNORMAL HIGH (ref 0.00–0.50)

## 2022-03-12 LAB — FERRITIN: Ferritin: 431 ng/mL — ABNORMAL HIGH (ref 24–336)

## 2022-03-12 MED ORDER — DORZOLAMIDE HCL 2 % OP SOLN
1.0000 [drp] | Freq: Every day | OPHTHALMIC | Status: DC
  Administered 2022-03-13: 1 [drp] via OPHTHALMIC

## 2022-03-12 MED ORDER — TAMSULOSIN HCL 0.4 MG PO CAPS
0.4000 mg | ORAL_CAPSULE | Freq: Every day | ORAL | Status: DC
  Administered 2022-03-12 – 2022-03-13 (×2): 0.4 mg via ORAL
  Filled 2022-03-12 (×2): qty 1

## 2022-03-12 MED ORDER — BRIMONIDINE TARTRATE 0.2 % OP SOLN
1.0000 [drp] | Freq: Every day | OPHTHALMIC | Status: DC
Start: 2022-03-13 — End: 2022-03-13
  Administered 2022-03-13: 1 [drp] via OPHTHALMIC

## 2022-03-12 NOTE — Consult Note (Signed)
[place] Nurse ostomy consult note Stoma type/location: LLQ colostomy created 5 years ago. Lives at home. Covid+. Stomal assessment/size: Not measured today Peristomal assessment: Not seen today Treatment options for stomal/peristomal skin: skin barrier ring Output: reported by Bedside RN, brown stool Ostomy pouching: 2pc. 2 and 1/4 inch ostomy pouching system with skin barrier ring Education provided: None. Staff (Bedside) Nurses will perform ostomy care while in house. Enrolled patient in [place] D/C program: No, patient is established with a provider.  Order Ostomy Supplies: 2 piece 2 and 1/4 inch pouching system: Pouch is Kellie Simmering #[phone], Skin barrier is Kellie Simmering # [phone] and skin barrier ring is Kellie Simmering # [phone]  It is suggested that Nursing order 5 of each supply above for use while in house.  [place] nursing team will not follow, but will remain available to this patient, the nursing and medical teams.  Please re-consult if needed. Thanks, Maudie Flakes, MSN, RN, [place], Arther Abbott  Pager# [phone]

## 2022-03-12 NOTE — Assessment & Plan Note (Signed)
Baseline creatinine around 2.  Renal function near baseline.

## 2022-03-12 NOTE — Evaluation (Signed)
Occupational Therapy Evaluation Patient Details Name: Jesse Macias MRN: [id number] DOB: 31-Mar-1924 Today's Date: 03/12/2022   History of Present Illness 88 y.o. male with medical history significant for chronic kidney disease stage III, diabetes mellitus, glaucoma, colostomy, hypertension, atrial fibrillation on A/C presented by EMS for coughing and mental status changes. covid+   Clinical Impression   Mr Jesse Macias was seen for OT evaluation this date. Prior to hospital admission, pt was Independnet for mobility, SETUP for ADLs 2/2 baseline blindness. Pt lives with family available 24/7. Pt currently requires MIN cues for orientation for bed mobility, no physical assist. SBA don pants in standing. MOD I don/doff socks in sitting. No AD use for ~2 shuffling steps, pt reports limited by fatigue and vision. Pt near baseline, will sign off. Upon hospital discharge, recommend no OT follow up.   Recommendations for follow up therapy are one component of a multi-disciplinary discharge planning process, led by the attending physician.  Recommendations may be updated based on patient status, additional functional criteria and insurance authorization.   Follow Up Recommendations  No OT follow up    Assistance Recommended at Discharge Intermittent Supervision/Assistance  Patient can return home with the following A little help with bathing/dressing/bathroom;A little help with walking and/or transfers;Help with stairs or ramp for entrance    Functional Status Assessment  Patient has had a recent decline in their functional status and demonstrates the ability to make significant improvements in function in a reasonable and predictable amount of time.  Equipment Recommendations  None recommended by OT    Recommendations for Other Services       Precautions / Restrictions Precautions Precautions: Fall Restrictions Weight Bearing Restrictions: No      Mobility Bed Mobility Overal bed mobility:  Modified Independent             General bed mobility comments: cues to initiate and orient to bed    Transfers Overall transfer level: Needs assistance Equipment used: None               General transfer comment: SUPERVISION + MIN cues for ~2 steps along EOB      Balance Overall balance assessment: No apparent balance deficits (not formally assessed)                                         ADL either performed or assessed with clinical judgement   ADL Overall ADL's : At baseline                                       General ADL Comments: SBA don pants in standing. MOD I don/doff socks in sitting. Near baseline however family reports increased fatgiue      Pertinent Vitals/Pain Pain Assessment Pain Assessment: No/denies pain     Hand Dominance     Extremity/Trunk Assessment Upper Extremity Assessment Upper Extremity Assessment: Overall WFL for tasks assessed   Lower Extremity Assessment Lower Extremity Assessment: Generalized weakness       Communication Communication Communication: HOH   Cognition Arousal/Alertness: Awake/alert Behavior During Therapy: WFL for tasks assessed/performed Overall Cognitive Status: Within Functional Limits for tasks assessed  Home Living Family/patient expects to be discharged to:: Private residence Living Arrangements: Spouse/significant other Available Help at Discharge: Available 24 hours/day (4 children + some paid help provide near 24/7 assist) Type of Home: House Home Access: Stairs to enter [employer] of Steps: 3   Home Layout: One [place]: Conservation officer, nature (2 wheels);Rollator (4 wheels);Cane - single point          Prior Functioning/Environment Prior Level of Function : Needs assist             Mobility Comments: vision and hearing necessitate some assist but  apparently he is relatively independent in the home most of the time ADLs Comments: empties colostomy bag, family handles changing bag        OT Problem List: Decreased activity tolerance         OT Goals(Current goals can be found in the care plan section) Acute Rehab OT Goals Patient Stated Goal: to go home OT Goal Formulation: With patient/family Time For Goal Achievement: 03/26/22 Potential to Achieve Goals: Good   AM-PAC OT "6 Clicks" Daily Activity     Outcome Measure Help from another person eating meals?: None Help from another person taking care of personal grooming?: A Little Help from another person toileting, which includes using toliet, bedpan, or urinal?: A Little Help from another person bathing (including washing, rinsing, drying)?: A Little Help from another person to put on and taking off regular upper body clothing?: None Help from another person to put on and taking off regular lower body clothing?: None 6 Click Score: 21   End of Session    Activity Tolerance: Patient tolerated treatment well Patient left: in bed;with call bell/phone within reach;with family/visitor present  OT Visit Diagnosis: Other abnormalities of gait and mobility (R26.89)                Time: 3664-4034 OT Time Calculation (min): 20 min Charges:  OT General Charges $OT Visit: 1 Visit OT Evaluation $OT Eval Low Complexity: 1 Low OT Treatments $Self Care/Home Management : 8-22 mins  Jesse Macias, M.S. OTR/L  03/12/22, 10:37 AM  ascom [phone]

## 2022-03-12 NOTE — Assessment & Plan Note (Signed)
-  Continue Coreg 

## 2022-03-12 NOTE — Progress Notes (Signed)
SLP Cancellation Note  Patient Details Name: Jesse Macias MRN: [id number] DOB: 02/11/1924   Cancelled treatment:       Reason Eval/Treat Not Completed:  (chart review; consulted NSG re: pt's status). Discussed pt's status, his eating/drinking today, w/ NSG. NSG reported he was swallowing pills adequately and taking bites/sips at meals w/ No overt s/s of aspiration noted. She did state that pt was not eating much at meals; Dietician is following pt.  Son denies pt with difficulty swallowing PTA per BSE note. Noted negative CXR at admit.  Recommend continue w/ current diet at Discharge -- easy to eat foods that break down and are moist for ease of chewing/eating. Recommend f/u w/ PCP if any concerns post d/c home. NSG to reconsult ST services if any new changes in swallowing during this admit. NSG agreed.        Orinda Kenner, MS, CCC-SLP Speech Language Pathologist Rehab Services; [place] [phone] (ascom) Jesse Macias 03/12/2022, 4:29 PM

## 2022-03-12 NOTE — Assessment & Plan Note (Signed)
Superimposed on CKD stage IV. Presented with creatinine 3.15, up from baseline around 2.0.  Likely prerenal azotemia in the setting of sepsis, hypotension.  Renal function has improved and now near baseline. -- Follow BMP -- Avoid nephrotoxins, hypotension

## 2022-03-12 NOTE — Assessment & Plan Note (Signed)
Resume Flomax (initially held due to hypotension)

## 2022-03-12 NOTE — Progress Notes (Signed)
   03/12/22 1612  Vitals  Resp (!) 35  Oxygen Therapy  SpO2 96 %  O2 Device Room Air   Called respiratory to come evaluate pt. Pt states is breathing is "bothering him"

## 2022-03-12 NOTE — Assessment & Plan Note (Signed)
Completed 3 days remdesivir. On empiric IV antibiotics for possible secondary bacterial pneumonia. O2 sats now stable on room air.  5/31: Patient had an aspiration event drinking Ensure to rapidly this morning.  We will monitor another 24 hours to ensure no hypoxia fevers or worsening respiratory status. -- Airborne and contact cautions -- Continue vitamins, Mucinex, supportive care per orders

## 2022-03-12 NOTE — Assessment & Plan Note (Addendum)
Present on admission with hypothermia, tachycardia, AKI hypotension and lactic acidosis consistent with endorgan damage.  Due to COVID-19 infection. Sepsis physiology has improved. Urine culture grew multiple species. Blood cultures no growth to date. -- Treated with IV antibiotics for possible secondary bacterial PNA.  Completed abx during admission.

## 2022-03-12 NOTE — Assessment & Plan Note (Signed)
Initially with RVR which improved with IV fluids.  Coreg resumed and increased to 12.5 mg twice daily.  Continue Xarelto.

## 2022-03-12 NOTE — Progress Notes (Signed)
Physical Therapy Treatment Patient Details Name: Jesse Macias MRN: [id number] DOB: 22-May-1924 Today's Date: 03/12/2022   History of Present Illness 88 y.o. male with medical history significant for chronic kidney disease stage III, diabetes mellitus, glaucoma, colostomy, hypertension, atrial fibrillation on A/C presented by EMS for coughing and mental status changes. covid+    PT Comments    Pt received sleeping in side lying. Easily awoken to voice and agreeable to PT services. Pt throughout requires no physical assist but frequent hand over hand guidance for use of touch to assist in room orientation.  Pt indep with bed mobility and able to stand with supervision and ambulate in room with single hand over hand assist and slow moving due to pt being blind. Pt able to successfully navigate room ambulating ~20' returning to supine in bed declining further activity as pt endorsing tiredness wanting to return to sleep. All needs in reach. PT updating RN that pt having difficulty finding call bell and how to call out to RN if pt has needs due to being blind. Educated Therapist, sports on trialing electrode for EKG over call bell for tactile stimulus so pt can be successful in calling for assistance if/when it is needed.    Recommendations for follow up therapy are one component of a multi-disciplinary discharge planning process, led by the attending physician.  Recommendations may be updated based on patient status, additional functional criteria and insurance authorization.  Follow Up Recommendations  Home health PT     Assistance Recommended at Discharge Set up Supervision/Assistance  Patient can return home with the following Assistance with cooking/housework;Direct supervision/assist for medications management;Assist for transportation;A little help with bathing/dressing/bathroom;A little help with walking and/or transfers   Equipment Recommendations  None recommended by PT    Recommendations for Other  Services       Precautions / Restrictions Precautions Precautions: Fall Restrictions Weight Bearing Restrictions: No     Mobility  Bed Mobility Overal bed mobility: Modified Independent             General bed mobility comments: cues to initiate and orient to bed Patient Response: Cooperative  Transfers Overall transfer level: Needs assistance Equipment used: None               General transfer comment: Supervision and hand over hand cues to stand at bedside    Ambulation/Gait Ambulation/Gait assistance: Supervision Gait Distance (Feet): 20 Feet Assistive device: 1 person hand held assist Gait Pattern/deviations: Step-to pattern, Decreased step length - right, Decreased step length - left       General Gait Details: Slow and cautious gait with hand hold assist and frequent hand placement on items in room due to pt being blind and to assist in safe room navigation   Stairs             Wheelchair Mobility    Modified Rankin (Stroke Patients Only)       Balance Overall balance assessment: Needs assistance Sitting-balance support: No upper extremity supported, Feet supported Sitting balance-Leahy Scale: Good     Standing balance support: No upper extremity supported   Standing balance comment: Pt able to maintain static standing w/o UE support, but with any dynamic standing activity his vision impairment limits his confidence and he did have some minimal unsteadiness with no LOBs                            Cognition Arousal/Alertness: Awake/alert Behavior During Therapy: [facility]  for tasks assessed/performed                                   General Comments: HoH        Exercises      General Comments        Pertinent Vitals/Pain Pain Assessment Pain Assessment: No/denies pain    Home Living                          Prior Function            PT Goals (current goals can now be found in the  care plan section) Acute Rehab PT Goals Patient Stated Goal: go home PT Goal Formulation: With patient/family Time For Goal Achievement: 03/25/22 Potential to Achieve Goals: Good Progress towards PT goals: Progressing toward goals    Frequency    Min 2X/week      PT Plan Current plan remains appropriate    Co-evaluation              AM-PAC PT "6 Clicks" Mobility   Outcome Measure  Help needed turning from your back to your side while in a flat bed without using bedrails?: None Help needed moving from lying on your back to sitting on the side of a flat bed without using bedrails?: None Help needed moving to and from a bed to a chair (including a wheelchair)?: A Little Help needed standing up from a chair using your arms (e.g., wheelchair or bedside chair)?: A Little Help needed to walk in hospital room?: A Little Help needed climbing 3-5 steps with a railing? : A Little 6 Click Score: 20    End of Session Equipment Utilized During Treatment: Gait belt Activity Tolerance: Patient tolerated treatment well;Patient limited by fatigue Patient left: in bed;with call bell/phone within reach;with bed alarm set Nurse Communication: Mobility status PT Visit Diagnosis: Muscle weakness (generalized) (M62.81);Difficulty in walking, not elsewhere classified (R26.2);Unsteadiness on feet (R26.81)     Time: 5003-7048 PT Time Calculation (min) (ACUTE ONLY): 17 min  Charges:  $Gait Training: 8-22 mins                    Salem Caster. Fairly IV, PT, DPT Physical Therapist- [facility]  03/12/2022, 4:01 PM

## 2022-03-12 NOTE — Assessment & Plan Note (Signed)
Unknown of present on admission.  I agree with the wound description as outlined. -- Frequent repositioning -- Monitor site closely for signs of infection  Pressure Injury 03/10/22 Coccyx Right;Left Stage 1 -  Intact skin with non-blanchable redness of a localized area usually over a bony prominence. reddened (Active)  03/10/22 1430  Location: Coccyx (buttocks and scaral area)  Location Orientation: Right;Left  Staging: Stage 1 -  Intact skin with non-blanchable redness of a localized area usually over a bony prominence.  Wound Description (Comments): reddened  Present on Admission:

## 2022-03-12 NOTE — Care Management Important Message (Signed)
Important Message  Patient Details  Name: Jesse Macias MRN: [id number] Date of Birth: [birth year]   Medicare Important Message Given:  Yes  Patient is in an isolation room so I called [phone]) to review the Important Message from Medicare with him but his son, [name] said he was sleeping so I reviewed with him. He said he understood his rights and I asked if he would like a copy of the form but he said it wasn't necessary.  I thanked him for his time.   Juliann Pulse A Rhian Asebedo 03/12/2022, 11:46 AM

## 2022-03-12 NOTE — Progress Notes (Signed)
Progress Note   Patient: Jesse Macias STM:[id number] DOB: 03-Jun-1924 DOA: 03/09/2022     3 DOS: the patient was seen and examined on 03/12/2022   Brief hospital course: Jesse Macias is a 88 y.o. male with medical history significant of chronic kidney disease stage III, diabetes mellitus without complication, glaucoma, hypertension, atrial fibrillation on anticoagulation presented by EMS on 03/09/2022 for evaluation of cough and mental status changes.  Patient is blind and hard of hearing.  Son provided much history.  EMS found patient hypoxic with spO2 82% on room air, improved on 3 L/min oxygen.  ED course - initial BP in the ER 52/32, was given 4 L of fluid and now sbp120.  HR 128, temp 96.0, on 2 L/min O2 satting 99%.  Labs notable for AKI, lactic acidosis, Covid-19 PCR Positive.  Son reported pt had received 2 doses Covid vaccines.  CXR was negative.  Admitted to the hospital. Treated with 3 days IV remdesivir and empiric IV antibiotics for possible secondary bacterial infection.  5/31: pt had severe choking and coughing spell after drinking Ensure too rapidly.  He was recovering but still in mild distress when seen on rounds.  Will monitor another 24 hours and hopefully d/c home tomorrow if doing okay, afebrile, no further hypoxia.   Assessment and Plan: * Severe sepsis (Jesse Macias) Present on admission with hypothermia, tachycardia, AKI hypotension and lactic acidosis consistent with endorgan damage.  Due to COVID-19 infection. Sepsis physiology has improved. Urine culture grew multiple species. Blood cultures no growth to date. -- Continue IV antibiotics given aspiration episode today  AKI (acute kidney injury) (Jesse Macias) Superimposed on CKD stage IV. Presented with creatinine 3.15, up from baseline around 2.0.  Likely prerenal azotemia in the setting of sepsis, hypotension.  Renal function has improved and now near baseline. -- Follow BMP -- Avoid nephrotoxins, hypotension  COVID-19 virus  infection Completed 3 days remdesivir. On empiric IV antibiotics for possible secondary bacterial pneumonia. O2 sats now stable on room air.  5/31: Patient had an aspiration event drinking Ensure to rapidly this morning.  We will monitor another 24 hours to ensure no hypoxia fevers or worsening respiratory status. -- Airborne and contact cautions -- Continue vitamins, Mucinex, supportive care per orders  Pressure injury of skin Unknown of present on admission.  I agree with the wound description as outlined. -- Frequent repositioning -- Monitor site closely for signs of infection  Pressure Injury 03/10/22 Coccyx Right;Left Stage 1 -  Intact skin with non-blanchable redness of a localized area usually over a bony prominence. reddened (Active)  03/10/22 1430  Location: Coccyx (buttocks and scaral area)  Location Orientation: Right;Left  Staging: Stage 1 -  Intact skin with non-blanchable redness of a localized area usually over a bony prominence.  Wound Description (Comments): reddened  Present on Admission:         CKD (chronic kidney disease), stage IV ([name]) Baseline creatinine around 2.  Renal function near baseline.  HTN (hypertension) Continue Coreg  BPH with obstruction/lower urinary tract symptoms Resume Flomax (initially held due to hypotension)  Atrial fibrillation, chronic ([name]) Initially with RVR which improved with IV fluids.  Coreg resumed and increased to 12.5 mg twice daily.  Continue Xarelto.        Subjective: Pt was having a severe coughing episode when I entered the room.  Son reported that patient had just been drinking Ensure and drink it too quickly, started choking.  Patient with very wet sounding cough and mild distress but  recovering okay during the encounter.  Son reports patient had been doing quite well and his cough was nearly resolved prior to this episode.  They were hopeful he can go home today but now understandably.  Son also reports that  patient has been wincing at times and when he asked patient what is wrong, he states that he is trying to pee.  Has not been on his Flomax here.  Discussed resuming this.  No other acute complaints or events reported.  Physical Exam: Vitals:   03/12/22 0502 03/12/22 0833 03/12/22 1529 03/12/22 1612  BP: (!) 147/81 119/78    Pulse: 78 85 92   Resp: 19 14  (!) 35  Temp: 98.1 F (36.7 C) 97.8 F (36.6 C) 98.5 F (36.9 C)   TempSrc:  Oral    SpO2: 99% 97% 98% 96%  Weight:      Height:       General exam: awake, alert, in mild distress due to choking episode HEENT:, Hard of hearing moist mucus membranes Respiratory system: Wet sounding cough, dyspnea, accessory muscle use, no wheezes, increased respiratory effort, on room air. Cardiovascular system: Regular rhythm, regular rate, no pedal edema.   Gastrointestinal system: soft, NT, ND Central nervous system: Blind, he hard of hearing but otherwise no gross focal neurologic deficits, normal speech Extremities: moves all, no edema, normal tone Skin: dry, intact, normal temperature Psychiatry: normal mood, congruent affect   Data Reviewed:  Notable labs: Ferritin 431, D-dimer 1.27, CBGs overall at goal  Family Communication: Son at bedside on rounds  Disposition: Status is: Inpatient Remains inpatient appropriate because: Warrants close monitoring of respiratory status in the setting of aspiration episode this morning.  Anticipate discharge tomorrow if no recurrence of hypoxia, fevers and further clinical improvement    Planned Discharge Destination: Home    Time spent: 40 minutes  Author: Ezekiel Slocumb, DO 03/12/2022 4:18 PM  For on call review [email or web address].

## 2022-03-12 NOTE — Hospital Course (Signed)
Waylen Depaolo is a 88 y.o. male with medical history significant of chronic kidney disease stage III, diabetes mellitus without complication, glaucoma, hypertension, atrial fibrillation on anticoagulation presented by EMS on 03/09/2022 for evaluation of cough and mental status changes.  Patient is blind and hard of hearing.  Son provided much history.  EMS found patient hypoxic with spO2 82% on room air, improved on 3 L/min oxygen.  ED course - initial BP in the ER 52/32, was given 4 L of fluid and now sbp120.  HR 128, temp 96.0, on 2 L/min O2 satting 99%.  Labs notable for AKI, lactic acidosis, Covid-19 PCR Positive.  Son reported pt had received 2 doses Covid vaccines.  CXR was negative.  Admitted to the hospital. Treated with 3 days IV remdesivir and empiric IV antibiotics for possible secondary bacterial infection.  5/31: pt had severe choking and coughing spell after drinking Ensure too rapidly.  He was recovering but still in mild distress when seen on rounds.  Will monitor another 24 hours and hopefully d/c home tomorrow if doing okay, afebrile, no further hypoxia.  6/1: pt stable without respiratory symptoms.  He remains quite fatigued but overall improved.  No fevers or recurrent hypoxia after aspiration event yesterday.  Medically stable for discharge with PCP follow up in about 1 week.  Son at bedside and agreeable with plan for discharge with HHPT.

## 2022-03-13 ENCOUNTER — Encounter: Payer: Self-pay | Admitting: Internal Medicine

## 2022-03-13 LAB — CBC
HCT: 34.6 % — ABNORMAL LOW (ref 39.0–52.0)
Hemoglobin: 11.8 g/dL — ABNORMAL LOW (ref 13.0–17.0)
MCH: 32.1 pg (ref 26.0–34.0)
MCHC: 34.1 g/dL (ref 30.0–36.0)
MCV: 94 fL (ref 80.0–100.0)
Platelets: 167 10*3/uL (ref 150–400)
RBC: 3.68 MIL/uL — ABNORMAL LOW (ref 4.22–5.81)
RDW: 13.7 % (ref 11.5–15.5)
WBC: 11.8 10*3/uL — ABNORMAL HIGH (ref 4.0–10.5)
nRBC: 0 % (ref 0.0–0.2)

## 2022-03-13 LAB — GLUCOSE, CAPILLARY
Glucose-Capillary: 139 mg/dL — ABNORMAL HIGH (ref 70–99)
Glucose-Capillary: 140 mg/dL — ABNORMAL HIGH (ref 70–99)
Glucose-Capillary: 160 mg/dL — ABNORMAL HIGH (ref 70–99)
Glucose-Capillary: 163 mg/dL — ABNORMAL HIGH (ref 70–99)
Glucose-Capillary: 207 mg/dL — ABNORMAL HIGH (ref 70–99)

## 2022-03-13 LAB — BASIC METABOLIC PANEL
Anion gap: 6 (ref 5–15)
BUN: 75 mg/dL — ABNORMAL HIGH (ref 8–23)
CO2: 24 mmol/L (ref 22–32)
Calcium: 9.1 mg/dL (ref 8.9–10.3)
Chloride: 116 mmol/L — ABNORMAL HIGH (ref 98–111)
Creatinine, Ser: 1.77 mg/dL — ABNORMAL HIGH (ref 0.61–1.24)
GFR, Estimated: 35 mL/min — ABNORMAL LOW (ref >60)
Glucose, Bld: 164 mg/dL — ABNORMAL HIGH (ref 70–99)
Potassium: 3.8 mmol/L (ref 3.5–5.1)
Sodium: 146 mmol/L — ABNORMAL HIGH (ref 135–145)

## 2022-03-13 LAB — MAGNESIUM: Magnesium: 1.9 mg/dL (ref 1.7–2.4)

## 2022-03-13 MED ORDER — RIVAROXABAN 15 MG PO TABS: 15.0000 mg | ORAL_TABLET | Freq: Every day | ORAL | 1 refills | Status: AC

## 2022-03-13 MED ORDER — ADULT MULTIVITAMIN W/MINERALS CH: 1.0000 | ORAL_TABLET | Freq: Every day | ORAL | Status: DC

## 2022-03-13 MED ORDER — DEXAMETHASONE 6 MG PO TABS: 6.0000 mg | ORAL_TABLET | ORAL | 0 refills | Status: AC

## 2022-03-13 MED ORDER — ENSURE ENLIVE PO LIQD: 237.0000 mL | Freq: Two times a day (BID) | ORAL | 12 refills | Status: DC

## 2022-03-13 MED ORDER — ASCORBIC ACID 500 MG PO TABS: 500.0000 mg | ORAL_TABLET | Freq: Every day | ORAL | Status: AC

## 2022-03-13 MED ORDER — ACETAMINOPHEN 325 MG PO TABS: 650.0000 mg | ORAL_TABLET | Freq: Four times a day (QID) | ORAL | Status: DC | PRN

## 2022-03-13 MED ORDER — ZINC SULFATE 220 (50 ZN) MG PO CAPS: 220.0000 mg | ORAL_CAPSULE | Freq: Every day | ORAL | 0 refills | Status: AC

## 2022-03-13 MED ORDER — GUAIFENESIN-DM 100-10 MG/5ML PO SYRP: 10.0000 mL | ORAL_SOLUTION | ORAL | 0 refills | Status: DC | PRN

## 2022-03-13 NOTE — TOC Progression Note (Addendum)
Transition of Care [facility]) - Progression Note    Patient Details  Name: Jesse Macias MRN: [id number] Date of Birth: 03/24/1924  [facility] CM/SW [place], RN Phone Number: 03/13/2022, 10:03 AM  Clinical Narrative:   [phone] Erasmo Downer from [facility] is following patient.  Addendum [facility] can provide all services as per Lorenzo.  They were scheduled to see him prior to his covid diagnosis.  They will begin care for patient over the weekend.  Expected Discharge Plan:  (TBD) Barriers to Discharge: Continued Medical Work up  Expected Discharge Plan and Services Expected Discharge Plan:  (TBD)   Discharge Planning Services: CM Consult Post Acute Care Choice:  (TBD) Living arrangements for the past 2 months: Single Family Home                                       Social Determinants of Health (SDOH) Interventions    Readmission Risk Interventions     View : No data to display.

## 2022-03-13 NOTE — Discharge Summary (Signed)
Physician Discharge Summary   Patient: Costas Sena MRN: [id number] DOB: [birth year]  Admit date:     03/09/2022  Discharge date: 03/13/22  Discharge Physician: Ezekiel Slocumb   PCP: Kirk Ruths, MD   Recommendations at discharge:   Follow up with primary care in 1 week Repeat BMP, Mg, CBC in 1 week   Discharge Diagnoses: Principal Problem:   Severe sepsis ([place]) Active Problems:   Atrial fibrillation, chronic ([name])   BPH with obstruction/lower urinary tract symptoms   HTN (hypertension)   CKD (chronic kidney disease), stage IV ([name])   Pressure injury of skin   COVID-19 virus infection  Resolved Problems:   AKI (acute kidney injury) [facility])  Hospital Course: Xan Ingraham is a 88 y.o. male with medical history significant of chronic kidney disease stage III, diabetes mellitus without complication, glaucoma, hypertension, atrial fibrillation on anticoagulation presented by EMS on 03/09/2022 for evaluation of cough and mental status changes.  Patient is blind and hard of hearing.  Son provided much history.  EMS found patient hypoxic with spO2 82% on room air, improved on 3 L/min oxygen.  ED course - initial BP in the ER 52/32, was given 4 L of fluid and now sbp120.  HR 128, temp 96.0, on 2 L/min O2 satting 99%.  Labs notable for AKI, lactic acidosis, Covid-19 PCR Positive.  Son reported pt had received 2 doses Covid vaccines.  CXR was negative.  Admitted to the hospital. Treated with 3 days IV remdesivir and empiric IV antibiotics for possible secondary bacterial infection.  5/31: pt had severe choking and coughing spell after drinking Ensure too rapidly.  He was recovering but still in mild distress when seen on rounds.  Will monitor another 24 hours and hopefully d/c home tomorrow if doing okay, afebrile, no further hypoxia.  6/1: pt stable without respiratory symptoms.  He remains quite fatigued but overall improved.  No fevers or recurrent hypoxia after aspiration  event yesterday.  Medically stable for discharge with PCP follow up in about 1 week.  Son at bedside and agreeable with plan for discharge with HHPT.   Assessment and Plan: * Severe sepsis ([place]) Present on admission with hypothermia, tachycardia, AKI hypotension and lactic acidosis consistent with endorgan damage.  Due to COVID-19 infection. Sepsis physiology has improved. Urine culture grew multiple species. Blood cultures no growth to date. -- Treated with IV antibiotics for possible secondary bacterial PNA.  Completed abx during admission.  COVID-19 virus infection Completed 3 days remdesivir. On empiric IV antibiotics for possible secondary bacterial pneumonia. O2 sats now stable on room air.  5/31: Patient had an aspiration event drinking Ensure to rapidly this morning.  We will monitor another 24 hours to ensure no hypoxia fevers or worsening respiratory status. -- Airborne and contact cautions -- Continue vitamins, Mucinex, supportive care per orders  Pressure injury of skin Unknown of present on admission.  I agree with the wound description as outlined. -- Frequent repositioning -- Monitor site closely for signs of infection  Pressure Injury 03/10/22 Coccyx Right;Left Stage 1 -  Intact skin with non-blanchable redness of a localized area usually over a bony prominence. reddened (Active)  03/10/22 1430  Location: Coccyx (buttocks and scaral area)  Location Orientation: Right;Left  Staging: Stage 1 -  Intact skin with non-blanchable redness of a localized area usually over a bony prominence.  Wound Description (Comments): reddened  Present on Admission:         CKD (chronic kidney disease), stage IV (  [place]) Baseline creatinine around 2.  Renal function near baseline.  HTN (hypertension) Continue Coreg  BPH with obstruction/lower urinary tract symptoms Resume Flomax (initially held due to hypotension)  Atrial fibrillation, chronic ([name]) Initially with RVR which  improved with IV fluids.  Coreg resumed and increased to 12.5 mg twice daily.  Continue Xarelto.  AKI (acute kidney injury) ([name])-resolved as of 03/13/2022 Superimposed on CKD stage IV. Presented with creatinine 3.15, up from baseline around 2.0.  Likely prerenal azotemia in the setting of sepsis, hypotension.  Renal function has improved and now near baseline. -- Follow BMP -- Avoid nephrotoxins, hypotension         Consultants: None  Procedures performed: None  Disposition: Home health Diet recommendation:  Discharge Diet Orders (From admission, onward)     Start     Ordered   03/13/22 0000  Diet - low sodium heart healthy        03/13/22 1207           Cardiac diet DISCHARGE MEDICATION: Allergies as of 03/13/2022       Reactions   Finasteride Other (See Comments)   gynecomastia        Medication List     STOP taking these medications    famotidine 20 MG tablet Commonly known as: PEPCID   latanoprost 0.005 % ophthalmic solution Commonly known as: XALATAN   mupirocin nasal ointment 2 % Commonly known as: BACTROBAN   vitamin B-12 500 MCG tablet Commonly known as: CYANOCOBALAMIN       TAKE these medications    acetaminophen 325 MG tablet Commonly known as: TYLENOL Take 2 tablets (650 mg total) by mouth every 6 (six) hours as needed for mild pain (or Fever >/= 101).   ascorbic acid 500 MG tablet Commonly known as: VITAMIN C Take 1 tablet (500 mg total) by mouth daily. Start taking on: March 14, 2022   brimonidine 0.2 % ophthalmic solution Commonly known as: ALPHAGAN Place 1 drop into both eyes 2 (two) times daily.   carvedilol 12.5 MG tablet Commonly known as: COREG Take 12.5 mg by mouth 2 (two) times daily with a meal.   dexamethasone 6 MG tablet Commonly known as: DECADRON Take 1 tablet (6 mg total) by mouth daily for 5 days.   dorzolamide 2 % ophthalmic solution Commonly known as: TRUSOPT Place 1 drop into both eyes in the morning and at  bedtime.   feeding supplement Liqd Take 237 mLs by mouth 2 (two) times daily between meals.   guaiFENesin-dextromethorphan 100-10 MG/5ML syrup Commonly known as: ROBITUSSIN DM Take 10 mLs by mouth every 4 (four) hours as needed for cough.   losartan-hydrochlorothiazide 100-12.5 MG tablet Commonly known as: Hyzaar Take 1 tablet by mouth daily.   multivitamin with minerals Tabs tablet Take 1 tablet by mouth daily. Start taking on: March 14, 2022   Rivaroxaban 15 MG Tabs tablet Commonly known as: XARELTO Take 1 tablet (15 mg total) by mouth daily with supper. What changed:  medication strength how much to take when to take this   tamsulosin 0.4 MG Caps capsule Commonly known as: FLOMAX Take 1 capsule (0.4 mg total) by mouth daily.   timolol 0.5 % ophthalmic solution Commonly known as: TIMOPTIC Place 1 drop into both eyes 2 (two) times daily.   zinc sulfate 220 (50 Zn) MG capsule Take 1 capsule (220 mg total) by mouth daily. Start taking on: March 14, 2022        Discharge Exam: [place]  03/09/22 1900  Weight: 60 kg   General exam: awake, alert, no acute distress HEENT: blind, moist mucus membranes, hard of hearing  Respiratory system: CTAB, no wheezes, rales or rhonchi, normal respiratory effort. On room air Cardiovascular system: normal S1/S2, RRR, no JVD, murmurs, rubs, gallops, no pedal edema.   Gastrointestinal system: soft, NT, ND, no HSM felt, +bowel sounds. Central nervous system: no gross focal neurologic deficits, normal speech Extremities: moves all, no edema, normal tone Skin: dry, intact, normal temperature Psychiatry: normal mood, congruent affect, judgement and insight appear normal   Condition at discharge: stable  The results of significant diagnostics from this hospitalization (including imaging, microbiology, ancillary and laboratory) are listed below for reference.   Imaging Studies: CT Head Wo Contrast  Result Date:  03/09/2022 CLINICAL DATA:  Altered mental status EXAM: CT HEAD WITHOUT CONTRAST TECHNIQUE: Contiguous axial images were obtained from the base of the skull through the vertex without intravenous contrast. RADIATION DOSE REDUCTION: This exam was performed according to the departmental dose-optimization program which includes automated exposure control, adjustment of the mA and/or kV according to patient size and/or use of iterative reconstruction technique. COMPARISON:  02/21/2021 FINDINGS: Brain: No evidence of acute infarction, hemorrhage, hydrocephalus, extra-axial collection or mass lesion/mass effect. Periventricular and deep white matter hypodensity. Small lacunar infarctions of the bilateral basal ganglia. Vascular: No hyperdense vessel or unexpected calcification. Skull: Normal. Negative for fracture or focal lesion. Sinuses/Orbits: No acute finding. Other: None. IMPRESSION: No acute intracranial pathology. Small-vessel white matter disease and small nonacute lacunar infarctions. Electronically Signed   By: Delanna Ahmadi M.D.   On: 03/09/2022 15:06   DG Chest Port 1 View  Result Date: 03/09/2022 CLINICAL DATA:  Cough for the past 2 weeks. Diminished left lower lung sounds. Possible sepsis. EXAM: PORTABLE CHEST 1 VIEW COMPARISON:  06/14/2015 FINDINGS: Normal sized heart. Tortuous and partially calcified thoracic aorta. Clear lungs with normal vascularity. Mild-to-moderate bilateral AC joint degenerative changes. IMPRESSION: No active disease. Electronically Signed   By: Claudie Revering M.D.   On: 03/09/2022 14:33   ECHOCARDIOGRAM COMPLETE  Result Date: 03/11/2022    ECHOCARDIOGRAM REPORT   Patient Name:   JOVANIE VERGE Date of Exam: 03/11/2022 Medical Rec #:  [id number]     Height:       63.0 in Accession #:    [id number]    Weight:       132.3 lb Date of Birth:  08-27-1924     BSA:          1.622 m Patient Age:    43 years      BP:           145/75 mmHg Patient Gender: M             HR:           77 bpm.  Exam Location:  Inpatient Procedure: 2D Echo, Cardiac Doppler and Color Doppler Indications:     Elevated Troponin  History:         Patient has prior history of Echocardiogram examinations, most                  recent 08/08/2021. Arrythmias:Atrial Fibrillation; Risk                  Factors:Hypertension. COVID.  Sonographer:     Darlina Sicilian RDCS Referring Phys:  [id number] [facility] AMERY Diagnosing Phys: Nelva Bush MD IMPRESSIONS  1. Left ventricular ejection fraction, by estimation, is 60 to 65%. The  left ventricle has normal function. Left ventricular endocardial border not optimally defined to evaluate regional wall motion. Left ventricular diastolic parameters are indeterminate.  2. Pulmonary artery pressure is at least mildly elevated (RVSP 35-40 mmHg plus central venous pressure). Right ventricular systolic function is normal. The right ventricular size is normal. Mildly increased right ventricular wall thickness.  3. The mitral valve is abnormal. Moderate mitral valve regurgitation. There is mild holosystolic prolapse of both leaflets of the mitral valve.  4. Tricuspid valve regurgitation is mild to moderate.  5. The aortic valve is tricuspid. There is moderate thickening of the aortic valve. Aortic valve regurgitation is mild. Aortic valve sclerosis is present, with no evidence of aortic valve stenosis. FINDINGS  Left Ventricle: Left ventricular ejection fraction, by estimation, is 60 to 65%. The left ventricle has normal function. Left ventricular endocardial border not optimally defined to evaluate regional wall motion. The left ventricular internal cavity size was normal in size. There is no left ventricular hypertrophy. Left ventricular diastolic parameters are indeterminate. Right Ventricle: Pulmonary artery pressure is at least mildly elevated (RVSP 35-40 mmHg plus central venous pressure). The right ventricular size is normal. Mildly increased right ventricular wall thickness. Right ventricular  systolic function is normal. Left Atrium: Left atrial size was not well visualized. Right Atrium: Right atrial size was not well visualized. Pericardium: Trivial pericardial effusion is present. Mitral Valve: The mitral valve is abnormal. There is mild holosystolic prolapse of both leaflets of the mitral valve. Moderate mitral valve regurgitation. Tricuspid Valve: The tricuspid valve is not well visualized. Tricuspid valve regurgitation is mild to moderate. Aortic Valve: The aortic valve is tricuspid. There is moderate thickening of the aortic valve. Aortic valve regurgitation is mild. Aortic valve sclerosis is present, with no evidence of aortic valve stenosis. Pulmonic Valve: The pulmonic valve was not well visualized. Pulmonic valve regurgitation is mild. No evidence of pulmonic stenosis. Aorta: The aortic root and ascending aorta are structurally normal, with no evidence of dilitation. Venous: The inferior vena cava was not well visualized. IAS/Shunts: The interatrial septum was not well visualized.  LEFT VENTRICLE PLAX 2D LVIDd:         4.40 cm LVIDs:         2.90 cm LV PW:         0.80 cm LV IVS:        1.10 cm LVOT diam:     1.80 cm LV SV:         26 LV SV Index:   16 LVOT Area:     2.54 cm  LEFT ATRIUM         Index LA diam:    3.80 cm 2.34 cm/m  AORTIC VALVE LVOT Vmax:   49.60 cm/s LVOT Vmean:  29.200 cm/s LVOT VTI:    0.103 m  AORTA Ao Root diam: 3.30 cm Ao Asc diam:  3.10 cm MITRAL VALVE               TRICUSPID VALVE MV Area (PHT): 4.04 cm    TR Peak grad:   36.2 mmHg MV Decel Time: 188 msec    TR Vmax:        301.00 cm/s MV E velocity: 68.60 cm/s MV A velocity: 35.10 cm/s  SHUNTS MV E/A ratio:  1.95        Systemic VTI:  0.10 m  Systemic Diam: 1.80 cm Nelva Bush MD Electronically signed by Nelva Bush MD Signature Date/Time: 03/11/2022/7:13:03 PM    Final     Microbiology: Results for orders placed or performed during the hospital encounter of 03/09/22  Blood  Culture (routine x 2)     Status: None (Preliminary result)   Collection Time: 03/09/22  1:48 PM   Specimen: BLOOD  Result Value Ref Range Status   Specimen Description BLOOD  Final   Special Requests   Final    BOTTLES DRAWN AEROBIC AND ANAEROBIC Blood Culture adequate volume   Culture   Final    NO GROWTH 4 DAYS Performed at [facility], [address] [place], [place] [address]    Report Status PENDING  Incomplete  Resp Panel by RT-PCR (Flu A&B, Covid) Anterior Nasal Swab     Status: Abnormal   Collection Time: 03/09/22  2:02 PM   Specimen: Anterior Nasal Swab  Result Value Ref Range Status   SARS Coronavirus 2 by RT PCR POSITIVE (A) NEGATIVE Final    Comment: (NOTE) SARS-CoV-2 target nucleic acids are DETECTED.  The SARS-CoV-2 RNA is generally detectable in upper respiratory specimens during the acute phase of infection. Positive results are indicative of the presence of the identified virus, but do not rule out bacterial infection or co-infection with other pathogens not detected by the test. Clinical correlation with patient history and other diagnostic information is necessary to determine patient infection status. The expected result is Negative.  Fact Sheet for Patients: EntrepreneurPulse.com.au  Fact Sheet for Healthcare Providers: IncredibleEmployment.be  This test is not yet approved or cleared by the [place] FDA and  has been authorized for detection and/or diagnosis of SARS-CoV-2 by FDA under an Emergency Use Authorization (EUA).  This EUA will remain in effect (meaning this test can be used) for the duration of  the COVID-19 declaration under Section 564(b)(1) of the A ct, 21 U.S.C. section 360bbb-3(b)(1), unless the authorization is terminated or revoked sooner.     Influenza A by PCR NEGATIVE NEGATIVE Final   Influenza B by PCR NEGATIVE NEGATIVE Final    Comment: (NOTE) The Xpert Xpress  SARS-CoV-2/FLU/RSV plus assay is intended as an aid in the diagnosis of influenza from Nasopharyngeal swab specimens and should not be used as a sole basis for treatment. Nasal washings and aspirates are unacceptable for Xpert Xpress SARS-CoV-2/FLU/RSV testing.  Fact Sheet for Patients: EntrepreneurPulse.com.au  Fact Sheet for Healthcare Providers: IncredibleEmployment.be  This test is not yet approved or cleared by the [place] FDA and has been authorized for detection and/or diagnosis of SARS-CoV-2 by FDA under an Emergency Use Authorization (EUA). This EUA will remain in effect (meaning this test can be used) for the duration of the COVID-19 declaration under Section 564(b)(1) of the Act, 21 U.S.C. section 360bbb-3(b)(1), unless the authorization is terminated or revoked.  Performed at [facility], [address] [place], [place] [address]   Urine Culture     Status: Abnormal   Collection Time: 03/09/22  5:26 PM   Specimen: In/Out Cath Urine  Result Value Ref Range Status   Specimen Description   Final    IN/OUT CATH URINE Performed at [facility], [address] [place], [place] [address]    Special Requests   Final    NONE Performed at [facility], [address] [place], [place] [address]    Culture MULTIPLE SPECIES PRESENT, SUGGEST RECOLLECTION (A)  Final   Report Status 03/11/2022  FINAL  Final  Culture, blood (Routine X 2) w Reflex to ID Panel     Status: None (Preliminary result)   Collection Time: 03/10/22  3:37 PM   Specimen: BLOOD  Result Value Ref Range Status   Specimen Description BLOOD BLOOD RIGHT HAND  Final   Special Requests   Final    BOTTLES DRAWN AEROBIC AND ANAEROBIC Blood Culture adequate volume   Culture   Final    NO GROWTH 3 DAYS Performed at [facility], [address] [place], [place] [address]    Report Status PENDING  Incomplete     Labs: CBC: Recent Labs  Lab 03/09/22 1348 03/10/22 0645 03/11/22 0844 03/13/22 0527  WBC 9.7 8.6  --  11.8*  NEUTROABS 7.7  --   --   --   HGB 12.4* 12.2*  --  11.8*  HCT 37.3* 36.4*  --  34.6*  MCV 98.4 96.6  --  94.0  PLT 146* 124* 185 094   Basic Metabolic Panel: Recent Labs  Lab 03/09/22 1348 03/10/22 0645 03/11/22 0543 03/13/22 0527  NA 140 141 144 146*  K 3.7 3.9 3.7 3.8  CL 107 110 112* 116*  CO2 '25 22 22 24  '$ GLUCOSE 166* 94 97 164*  BUN 99* 79* 79* 75*  CREATININE 3.15* 2.23* 2.01* 1.77*  CALCIUM 8.9 8.8* 8.9 9.1  MG  --   --  1.9 1.9   Liver Function Tests: Recent Labs  Lab 03/09/22 1348  AST 37  ALT 32  ALKPHOS 53  BILITOT 1.1  PROT 5.5*  ALBUMIN 2.9*   CBG: Recent Labs  Lab 03/12/22 2052 03/13/22 0032 03/13/22 0531 03/13/22 0751 03/13/22 1211  GLUCAP 145* 140* 163* 139* 207*    Discharge time spent: less than 30 minutes.  Signed: Ezekiel Slocumb, DO [facility] 03/13/2022

## 2022-03-14 LAB — CULTURE, BLOOD (ROUTINE X 2)
Culture: NO GROWTH
Special Requests: ADEQUATE

## 2022-03-15 LAB — CULTURE, BLOOD (ROUTINE X 2)
Culture: NO GROWTH
Special Requests: ADEQUATE

## 2024-09-12 DEATH — deceased
